# Patient Record
Sex: Female | Born: 1935 | Race: White | Hispanic: No | Smoking: Never smoker
Health system: Southern US, Community
[De-identification: ages and names within clinical notes are randomized; demographics above are authoritative.]

## PROBLEM LIST (undated history)

## (undated) DIAGNOSIS — E079 Disorder of thyroid, unspecified: Secondary | ICD-10-CM

## (undated) DIAGNOSIS — I639 Cerebral infarction, unspecified: Secondary | ICD-10-CM

## (undated) DIAGNOSIS — N3281 Overactive bladder: Secondary | ICD-10-CM

## (undated) DIAGNOSIS — N189 Chronic kidney disease, unspecified: Secondary | ICD-10-CM

## (undated) DIAGNOSIS — I10 Essential (primary) hypertension: Secondary | ICD-10-CM

## (undated) DIAGNOSIS — M1990 Unspecified osteoarthritis, unspecified site: Secondary | ICD-10-CM

## (undated) DIAGNOSIS — K219 Gastro-esophageal reflux disease without esophagitis: Secondary | ICD-10-CM

## (undated) DIAGNOSIS — I82409 Acute embolism and thrombosis of unspecified deep veins of unspecified lower extremity: Secondary | ICD-10-CM

## (undated) HISTORY — PX: ABDOMINAL HYSTERECTOMY: SHX81

## (undated) HISTORY — PX: TOTAL HIP ARTHROPLASTY: SHX124

## (undated) HISTORY — DX: Acute embolism and thrombosis of unspecified deep veins of unspecified lower extremity: I82.409

## (undated) HISTORY — DX: Unspecified osteoarthritis, unspecified site: M19.90

## (undated) HISTORY — PX: OTHER SURGICAL HISTORY: SHX169

## (undated) HISTORY — DX: Overactive bladder: N32.81

## (undated) HISTORY — DX: Disorder of thyroid, unspecified: E07.9

## (undated) HISTORY — DX: Essential (primary) hypertension: I10

## (undated) HISTORY — DX: Cerebral infarction, unspecified: I63.9

## (undated) HISTORY — PX: TOTAL KNEE ARTHROPLASTY: SHX125

## (undated) HISTORY — DX: Gastro-esophageal reflux disease without esophagitis: K21.9

---

## 1998-07-21 ENCOUNTER — Other Ambulatory Visit: Admission: RE | Admit: 1998-07-21 | Discharge: 1998-07-21 | Payer: Self-pay | Admitting: Family Medicine

## 2000-05-14 ENCOUNTER — Ambulatory Visit (HOSPITAL_COMMUNITY): Admission: RE | Admit: 2000-05-14 | Discharge: 2000-05-14 | Payer: Self-pay | Admitting: Ophthalmology

## 2001-09-02 ENCOUNTER — Encounter: Payer: Self-pay | Admitting: Orthopedic Surgery

## 2001-09-02 ENCOUNTER — Ambulatory Visit (HOSPITAL_COMMUNITY): Admission: RE | Admit: 2001-09-02 | Discharge: 2001-09-02 | Payer: Self-pay | Admitting: Orthopedic Surgery

## 2001-09-16 ENCOUNTER — Encounter: Payer: Self-pay | Admitting: Neurosurgery

## 2001-09-18 ENCOUNTER — Inpatient Hospital Stay (HOSPITAL_COMMUNITY): Admission: RE | Admit: 2001-09-18 | Discharge: 2001-09-22 | Payer: Self-pay | Admitting: Neurosurgery

## 2001-09-18 ENCOUNTER — Encounter: Payer: Self-pay | Admitting: Neurosurgery

## 2002-11-02 ENCOUNTER — Encounter: Payer: Self-pay | Admitting: Orthopedic Surgery

## 2002-11-02 ENCOUNTER — Inpatient Hospital Stay (HOSPITAL_COMMUNITY): Admission: AD | Admit: 2002-11-02 | Discharge: 2002-11-08 | Payer: Self-pay | Admitting: Orthopedic Surgery

## 2002-11-08 ENCOUNTER — Inpatient Hospital Stay
Admission: RE | Admit: 2002-11-08 | Discharge: 2002-11-18 | Payer: Self-pay | Admitting: Physical Medicine & Rehabilitation

## 2002-11-10 ENCOUNTER — Encounter: Payer: Self-pay | Admitting: Orthopedic Surgery

## 2003-01-13 ENCOUNTER — Encounter: Admission: RE | Admit: 2003-01-13 | Discharge: 2003-04-12 | Payer: Self-pay | Admitting: Orthopedic Surgery

## 2003-06-27 ENCOUNTER — Encounter: Admission: RE | Admit: 2003-06-27 | Discharge: 2003-06-27 | Payer: Self-pay | Admitting: Emergency Medicine

## 2003-06-27 ENCOUNTER — Encounter: Payer: Self-pay | Admitting: Orthopedic Surgery

## 2003-07-11 ENCOUNTER — Ambulatory Visit (HOSPITAL_BASED_OUTPATIENT_CLINIC_OR_DEPARTMENT_OTHER): Admission: RE | Admit: 2003-07-11 | Discharge: 2003-07-12 | Payer: Self-pay | Admitting: Orthopedic Surgery

## 2004-08-14 ENCOUNTER — Ambulatory Visit: Payer: Self-pay | Admitting: Physical Medicine & Rehabilitation

## 2004-08-14 ENCOUNTER — Inpatient Hospital Stay (HOSPITAL_COMMUNITY): Admission: RE | Admit: 2004-08-14 | Discharge: 2004-08-17 | Payer: Self-pay | Admitting: Orthopedic Surgery

## 2004-08-17 ENCOUNTER — Inpatient Hospital Stay
Admission: RE | Admit: 2004-08-17 | Discharge: 2004-08-24 | Payer: Self-pay | Admitting: Physical Medicine & Rehabilitation

## 2004-10-01 ENCOUNTER — Encounter: Admission: RE | Admit: 2004-10-01 | Discharge: 2004-11-12 | Payer: Self-pay | Admitting: Orthopedic Surgery

## 2004-11-08 ENCOUNTER — Ambulatory Visit: Payer: Self-pay | Admitting: Family Medicine

## 2005-02-06 ENCOUNTER — Ambulatory Visit: Payer: Self-pay | Admitting: Family Medicine

## 2005-05-02 ENCOUNTER — Ambulatory Visit: Payer: Self-pay | Admitting: Family Medicine

## 2005-05-30 ENCOUNTER — Ambulatory Visit: Payer: Self-pay | Admitting: Family Medicine

## 2005-07-29 ENCOUNTER — Ambulatory Visit: Payer: Self-pay | Admitting: Family Medicine

## 2005-10-08 ENCOUNTER — Ambulatory Visit: Payer: Self-pay | Admitting: Family Medicine

## 2006-02-11 ENCOUNTER — Encounter: Admission: RE | Admit: 2006-02-11 | Discharge: 2006-02-11 | Payer: Self-pay | Admitting: Orthopedic Surgery

## 2006-05-15 ENCOUNTER — Ambulatory Visit: Payer: Self-pay | Admitting: Family Medicine

## 2006-08-29 ENCOUNTER — Ambulatory Visit: Payer: Self-pay | Admitting: Family Medicine

## 2006-09-29 ENCOUNTER — Ambulatory Visit: Payer: Self-pay | Admitting: Family Medicine

## 2006-11-20 ENCOUNTER — Ambulatory Visit: Payer: Self-pay | Admitting: Family Medicine

## 2007-01-12 ENCOUNTER — Ambulatory Visit: Payer: Self-pay | Admitting: Family Medicine

## 2007-01-29 ENCOUNTER — Ambulatory Visit: Payer: Self-pay | Admitting: Family Medicine

## 2007-01-29 ENCOUNTER — Inpatient Hospital Stay (HOSPITAL_COMMUNITY): Admission: AD | Admit: 2007-01-29 | Discharge: 2007-02-11 | Payer: Self-pay | Admitting: Internal Medicine

## 2007-02-05 ENCOUNTER — Ambulatory Visit: Payer: Self-pay | Admitting: Internal Medicine

## 2007-03-16 ENCOUNTER — Ambulatory Visit: Payer: Self-pay | Admitting: Family Medicine

## 2007-03-19 ENCOUNTER — Ambulatory Visit: Payer: Self-pay | Admitting: Family Medicine

## 2007-05-09 ENCOUNTER — Ambulatory Visit: Payer: Self-pay | Admitting: Cardiology

## 2007-05-09 ENCOUNTER — Inpatient Hospital Stay (HOSPITAL_COMMUNITY): Admission: EM | Admit: 2007-05-09 | Discharge: 2007-05-13 | Payer: Self-pay | Admitting: *Deleted

## 2007-05-12 ENCOUNTER — Encounter: Payer: Self-pay | Admitting: Cardiology

## 2007-07-02 ENCOUNTER — Inpatient Hospital Stay (HOSPITAL_COMMUNITY): Admission: EM | Admit: 2007-07-02 | Discharge: 2007-07-06 | Payer: Self-pay | Admitting: Emergency Medicine

## 2007-07-02 ENCOUNTER — Ambulatory Visit: Payer: Self-pay | Admitting: Cardiovascular Disease

## 2008-02-25 ENCOUNTER — Ambulatory Visit (HOSPITAL_COMMUNITY): Admission: RE | Admit: 2008-02-25 | Discharge: 2008-02-25 | Payer: Self-pay | Admitting: Orthopedic Surgery

## 2008-03-10 ENCOUNTER — Inpatient Hospital Stay (HOSPITAL_COMMUNITY): Admission: RE | Admit: 2008-03-10 | Discharge: 2008-03-13 | Payer: Self-pay | Admitting: Orthopedic Surgery

## 2008-03-28 ENCOUNTER — Inpatient Hospital Stay (HOSPITAL_COMMUNITY): Admission: AD | Admit: 2008-03-28 | Discharge: 2008-04-04 | Payer: Self-pay | Admitting: Orthopedic Surgery

## 2008-03-28 ENCOUNTER — Ambulatory Visit: Payer: Self-pay | Admitting: Cardiology

## 2008-03-29 ENCOUNTER — Ambulatory Visit: Payer: Self-pay | Admitting: Infectious Diseases

## 2008-04-20 ENCOUNTER — Ambulatory Visit (HOSPITAL_COMMUNITY): Admission: RE | Admit: 2008-04-20 | Discharge: 2008-04-20 | Payer: Self-pay | Admitting: Family Medicine

## 2008-07-11 IMAGING — CR DG CHEST 2V
1 series · 1 of 1 positions shown · non-contrast
Comparison: 02/25/2008

CLINICAL DATA: Hip infection.  Preoperative respiratory exam.
Hypertension.

CHEST - 2 VIEW

[view not recorded]
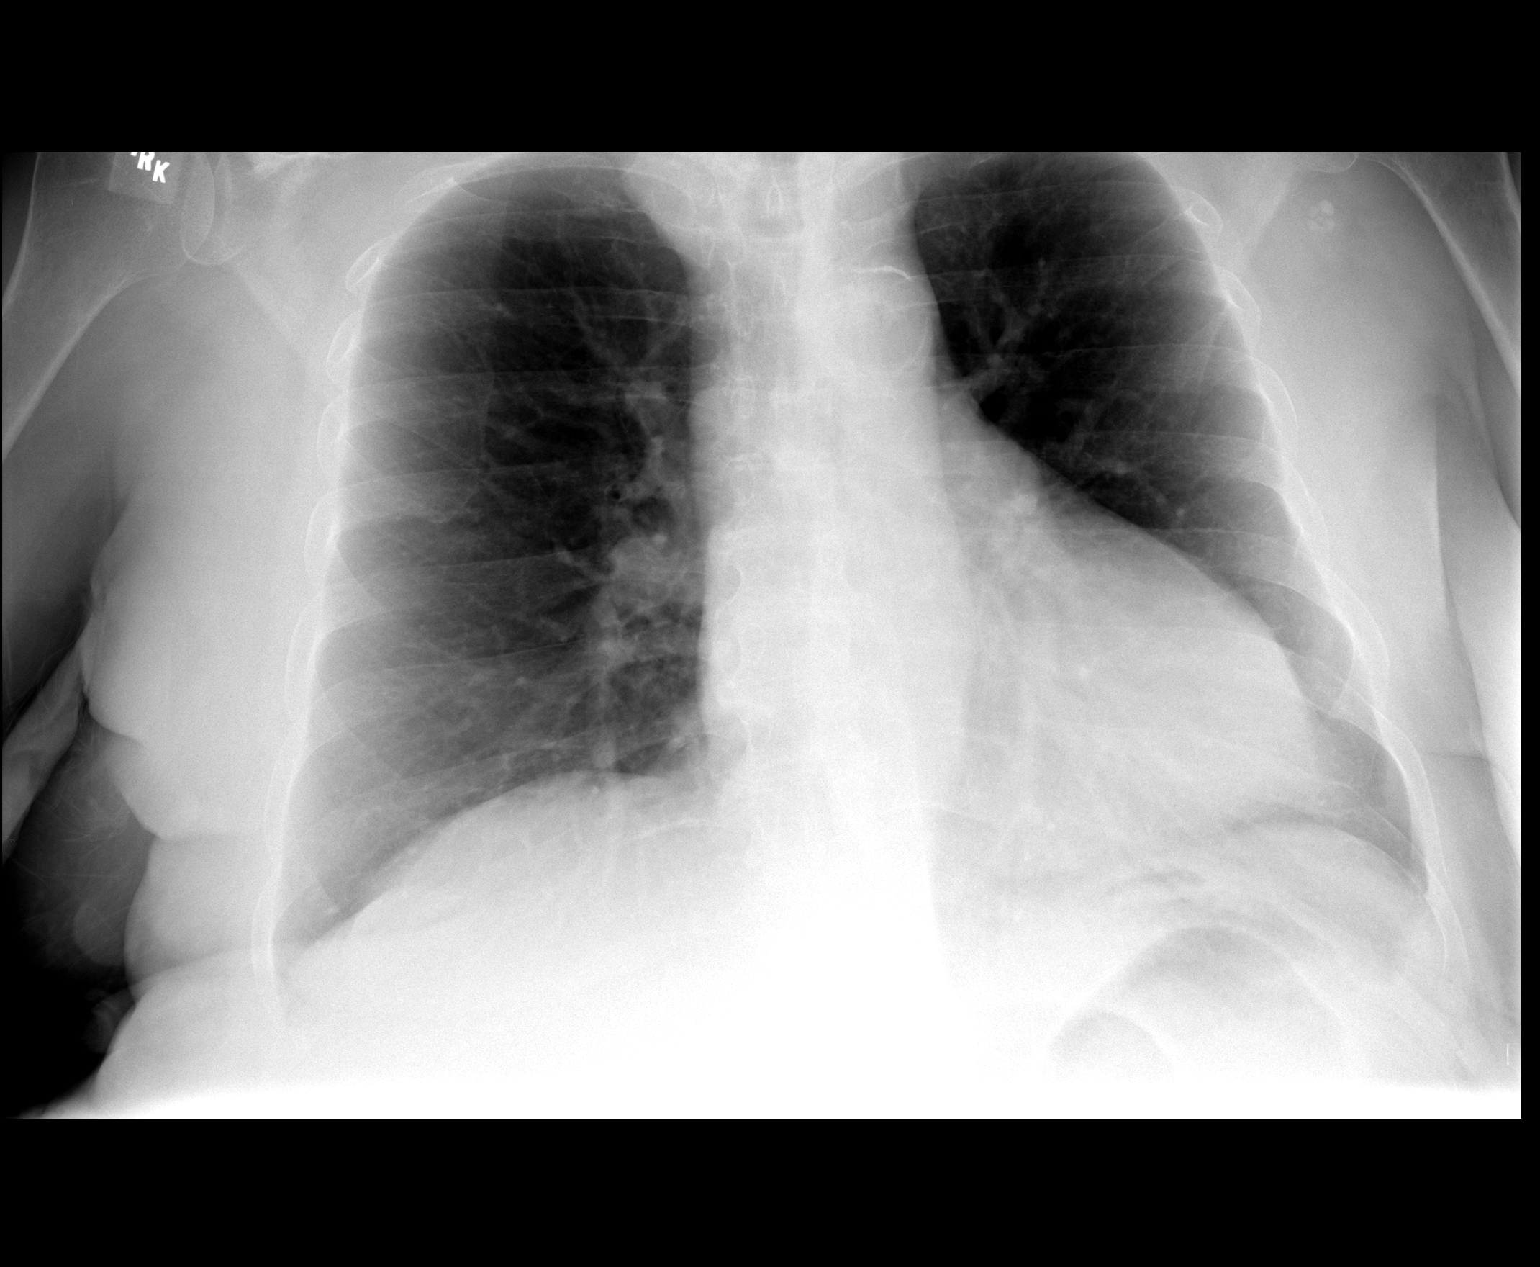

[1 of 1 positions shown; findings below may reference images not displayed]

FINDINGS: The heart is at the upper limits normal in size.  There
is calcification of the thoracic aorta.  Lungs are clear.  No
effusions.  No significant bony finding.
IMPRESSION: No active disease

## 2008-07-14 IMAGING — CR DG CHEST 1V PORT
1 series · 1 of 1 positions shown · non-contrast
Comparison: 03/28/2008.

CLINICAL DATA: Infected total hip replacement.  PICC line
placement.

PORTABLE CHEST - 1 VIEW

[view not recorded]
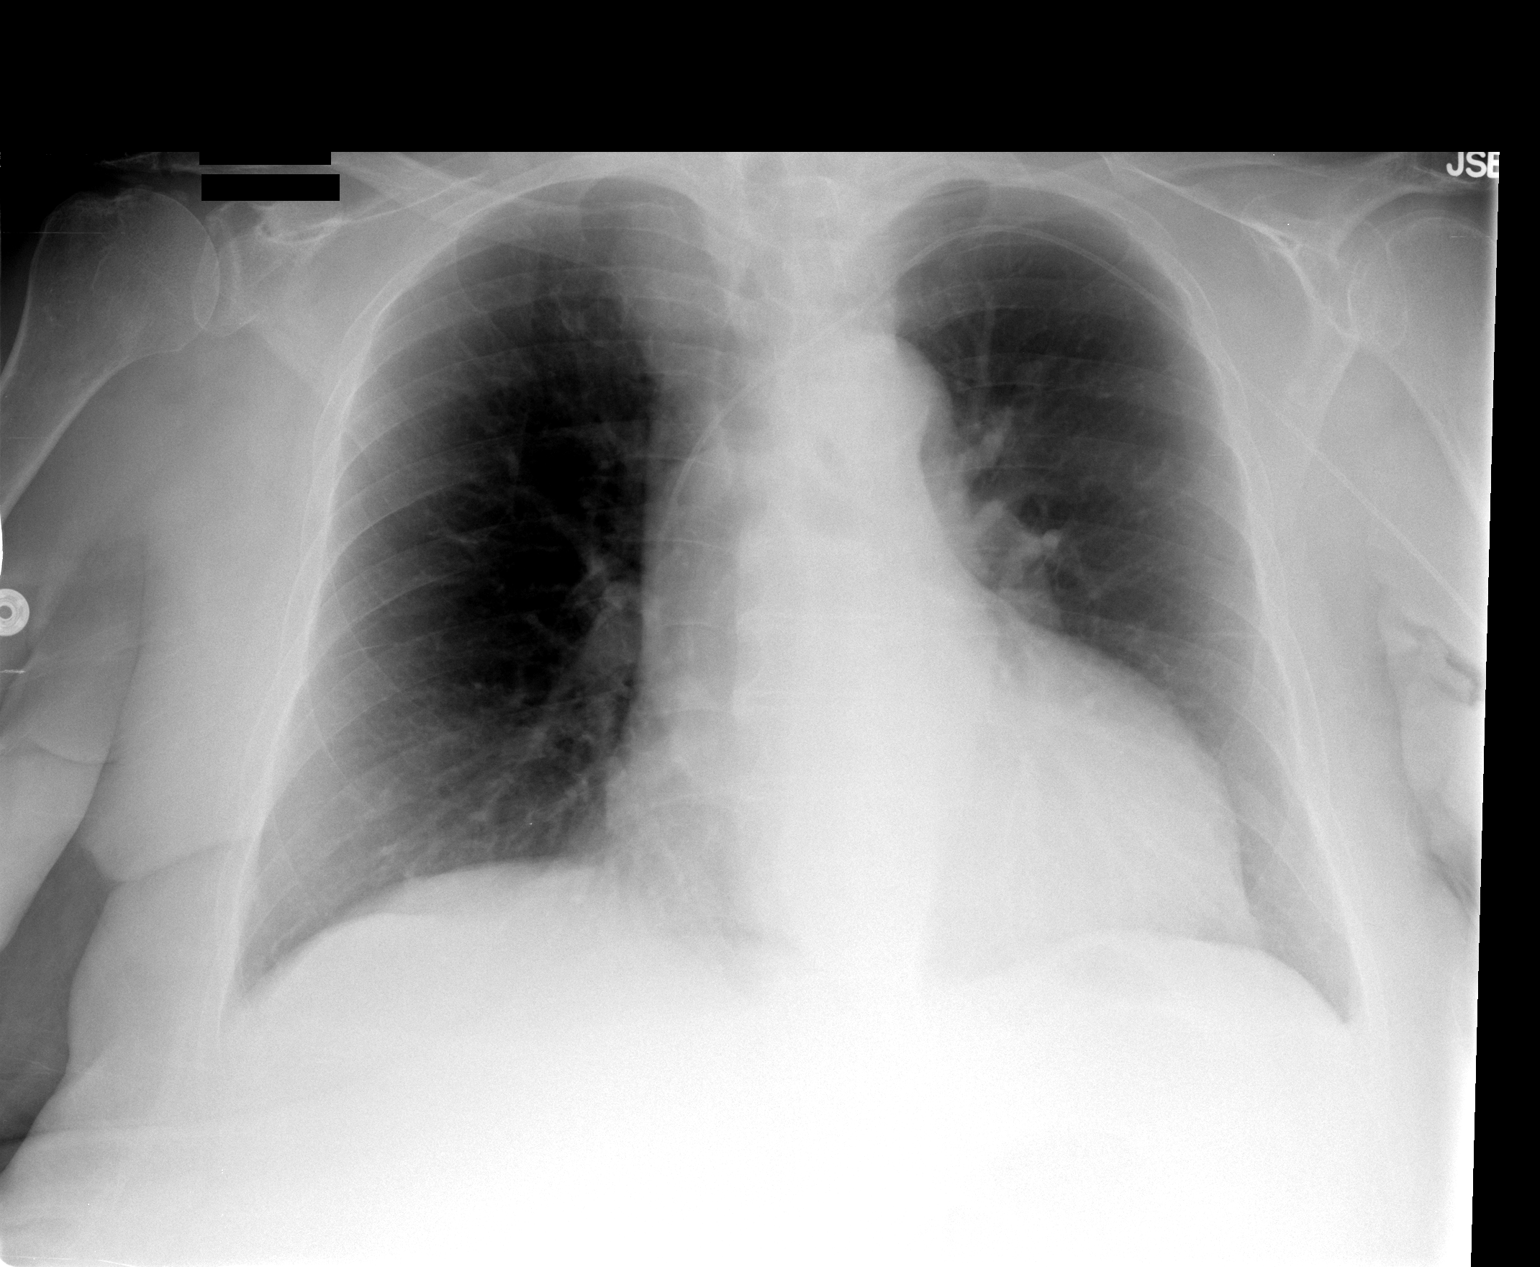

[1 of 1 positions shown; findings below may reference images not displayed]

FINDINGS: 8688 hours.  Left arm PICC tip is in the lower SVC.
There is no pneumothorax. The lungs are clear.  Cardiomegaly and
mediastinal contours are unchanged.
IMPRESSION: PICC line tip in the lower SVC.  No pneumothorax or acute findings.

## 2008-07-15 IMAGING — CR DG CHEST 1V PORT
1 series · 1 of 1 positions shown · non-contrast
Comparison: 03/31/2008

CLINICAL DATA: Infected right total hip.  Chest pain.

PORTABLE CHEST - 1 VIEW

[view not recorded]
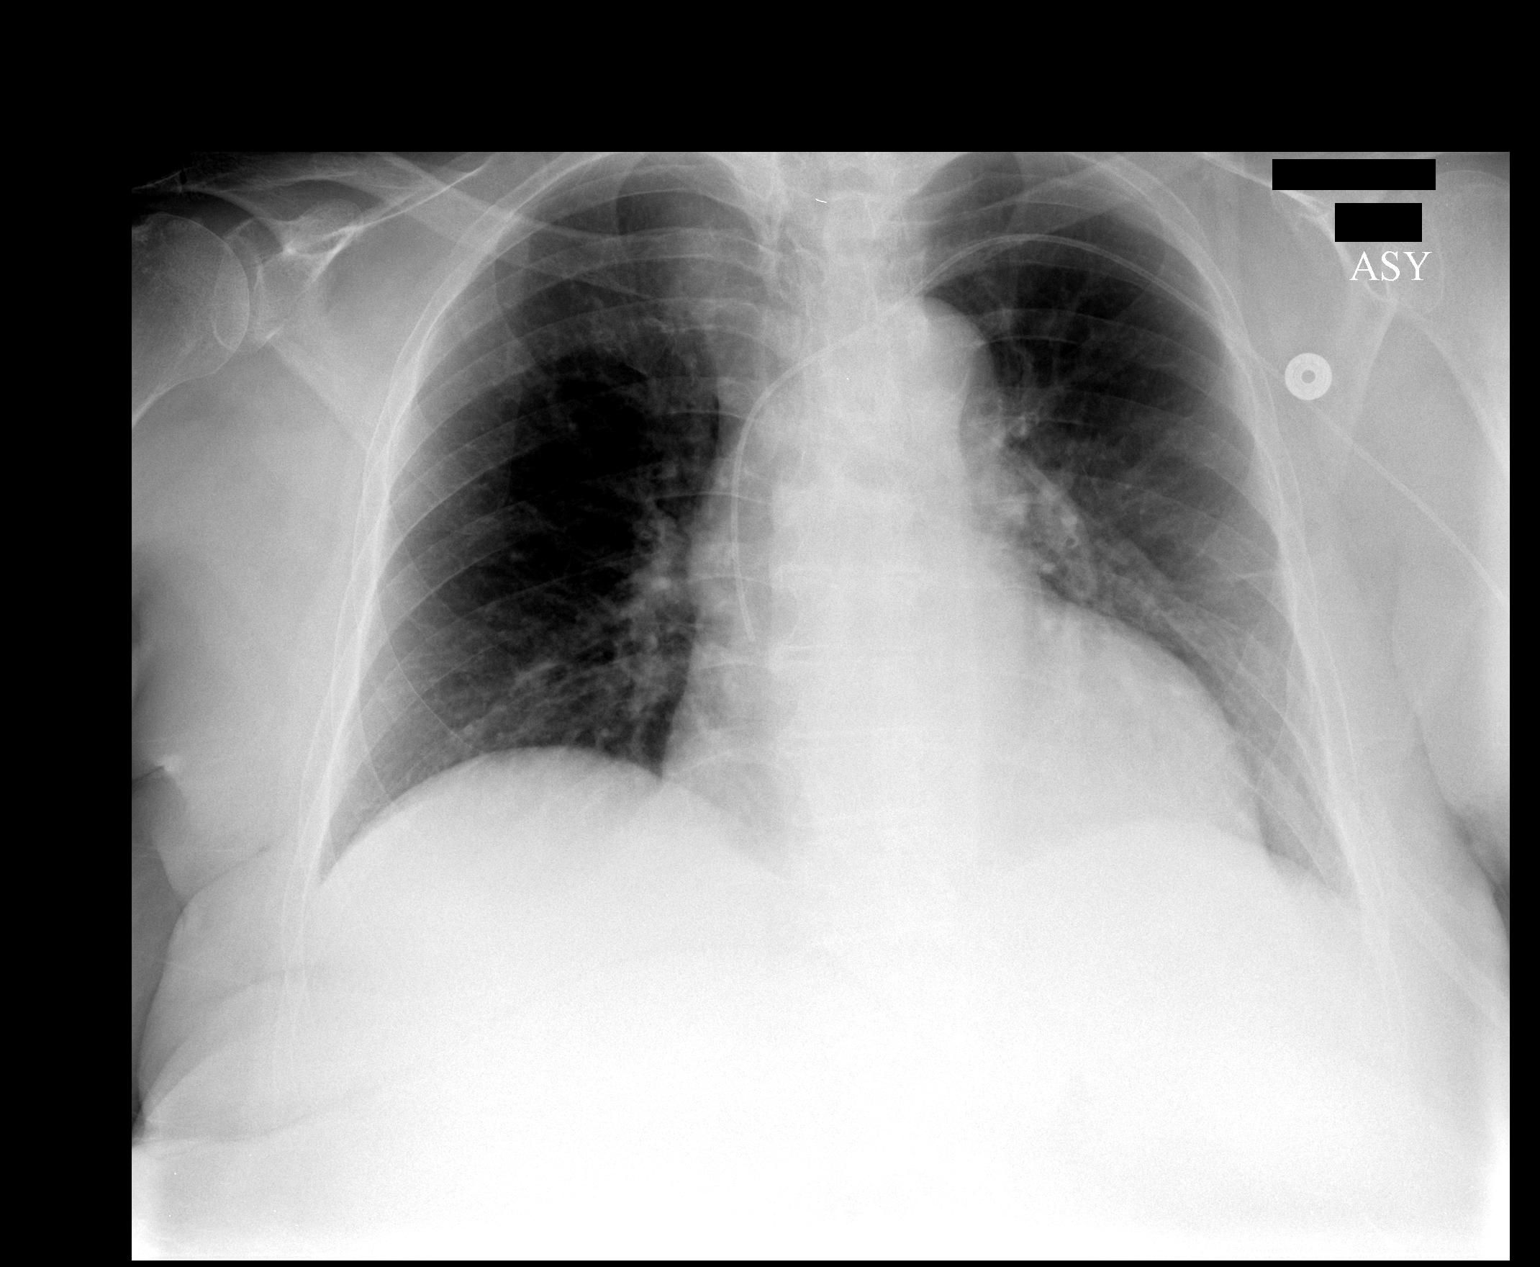

[1 of 1 positions shown; findings below may reference images not displayed]

FINDINGS: Left upper extremity PICC line tip is near the SVC -
right atrial junction.  Linear scarring left mid lung zone.  No
infiltrate or edema.  No CHF.  Cardiomediastinal silhouette appears
unremarkable.
IMPRESSION: No acute chest findings.

## 2008-07-15 IMAGING — CR DG ABD PORTABLE 1V
1 series · 1 of 1 positions shown · non-contrast
Comparison: 01/31/2007

CLINICAL DATA: Infected right hip.  Evaluate for obstruction.

ABDOMEN - 1 VIEW

[view not recorded]
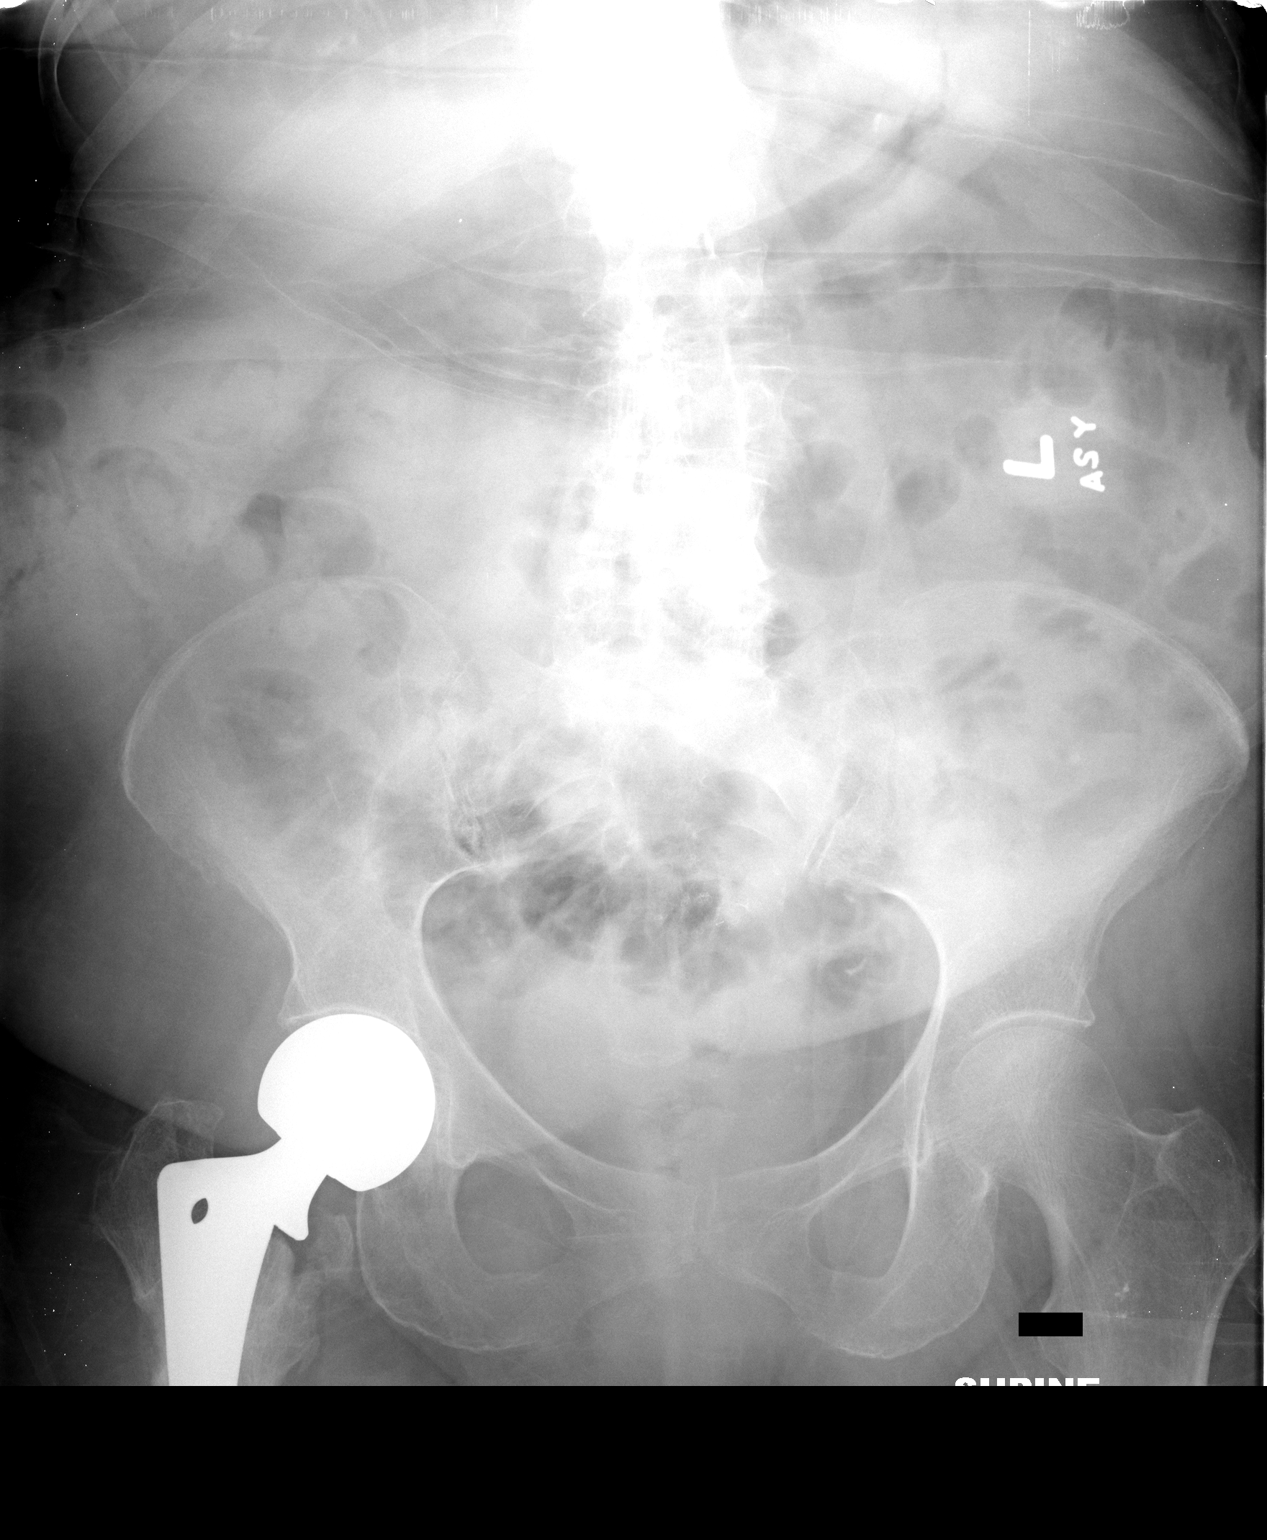

[1 of 1 positions shown; findings below may reference images not displayed]

FINDINGS: Portable supine view of the abdomen was obtained.  The
patient has an IVC filter present.  The patient has a right hip
hemiarthroplasty.  The bowel gas pattern is nonspecific with
suggestion of gas in the small and large bowel.  The patient has
stable levoscoliosis of the lumbar spine.
IMPRESSION: Nonspecific bowel gas pattern.  Right hip hemiarthroplasty and
presence of an IVC filter.

## 2008-10-05 ENCOUNTER — Encounter: Admission: RE | Admit: 2008-10-05 | Discharge: 2008-10-05 | Payer: Self-pay | Admitting: *Deleted

## 2008-11-30 ENCOUNTER — Ambulatory Visit (HOSPITAL_COMMUNITY): Admission: RE | Admit: 2008-11-30 | Discharge: 2008-12-05 | Payer: Self-pay | Admitting: *Deleted

## 2009-02-06 ENCOUNTER — Inpatient Hospital Stay (HOSPITAL_COMMUNITY): Admission: RE | Admit: 2009-02-06 | Discharge: 2009-02-10 | Payer: Self-pay | Admitting: *Deleted

## 2009-02-06 ENCOUNTER — Encounter (INDEPENDENT_AMBULATORY_CARE_PROVIDER_SITE_OTHER): Payer: Self-pay | Admitting: Surgery

## 2010-10-24 ENCOUNTER — Inpatient Hospital Stay (HOSPITAL_COMMUNITY)
Admission: RE | Admit: 2010-10-24 | Discharge: 2010-10-28 | Payer: Self-pay | Source: Home / Self Care | Attending: Surgery | Admitting: Surgery

## 2010-12-04 NOTE — Discharge Summary (Signed)
  NAMECHERYLN, Tracy Walker NO.:  [id number]  MEDICAL RECORD NO.:  [id number]          PATIENT TYPE:  INP  LOCATION:  1528                         FACILITY:  [facility]  PHYSICIAN:  Ardeth Sportsman, MD     DATE OF BIRTH:  Mar 05, 1928  DATE OF ADMISSION:  10/24/2010 DATE OF DISCHARGE:  10/28/2010                              DISCHARGE SUMMARY   PRIMARY CARE PHYSICIAN:  Dr. Lia Hopping  SURGEON:  Ardeth Sportsman, M.D.  DIAGNOSIS:  Ventral incisional hernias x4, 20 x 12 cm total region.  PROCEDURE PERFORMED:  Laparoscopic lysis of adhesions, laparoscopic ventral hernia repair with mesh, and open primary repair and excision of excess hernia sac, all on 10/24/2010.  OTHER DIAGNOSES: 1. Anemia. 2. Hypertension. 3. Gastroesophageal reflux disease. 4. Urinary incontinence. 5. Status post appendectomy. 6. Diverticulosis with episode of perforated diverticulitis, status     post Hartmann procedure in 2008, and colostomy takedown and primary     hernia repair in 2010.  DISCHARGE MEDICATIONS:  Discharge medications are noted on the chart and include, 1. Multivitamin daily. 2. Coenzyme Q daily. 3. Enteric-coated aspirin 81 mg daily. 4. Detrol 4 mg p.o. q.h.s. 5. Hydrocodone/acetaminophen 5/500 mg 1-2 p.o. t.i.d. p.r.n. pain. 6. Omeprazole 20 mg p.o. q.a.m. 7. Iron sulfate 325 mg daily. 8. Clonidine 0.2 mg b.i.d. 9. Furosemide 20 mg b.i.d.  SUMMARY OF HOSPITAL COURSE:  Tracy Walker is an 77 year old obese female who had required emergent surgery for perforated diverticulitis per Dr. Cleotis Nipper in 2008.  She underwent colostomy takedown.  She developed hernias in numerous locations at these incisions.  She came to me for evaluation and surgical repair.  She underwent that primarily and mesh reinforcement laparoscopically.  Postoperatively, she was followed in-house and gradually started on liquids.  She did have some nausea and emesis in the first 24 hours, but that seemed  to improve.  She began to mobilize better.  She had physical therapy evaluation and thought she would benefit from home physical therapy.  By postoperative day #4, she was walking better with assistance.  She was eating better.  She was having flatus.  Drainage output around her former hernia sac had decreased.  Therefore, I thought it would be reasonable to discharge her home with following instructions, 1. She is to return to clinic to see me within 7-10 days for probable     drain removal. 2. She should work on physical therapy in home to exercise regularly. 3. She should stay on fiber and bowel regimen to avoid severe     constipation. 4. She should call if she has worsening fever, chills, sweats, nausea,     vomiting, uncontrolled pain, diarrhea, or other concerns.     Ardeth Sportsman, MD     SCG/MEDQ  D:  11/26/2010  T:  11/27/2010  Job:  [id number]  Electronically Signed by Karie Soda MD on 12/04/2010 09:50:03 AM

## 2011-01-07 LAB — CREATININE, SERUM
Creatinine, Ser: 1.34 mg/dL — ABNORMAL HIGH (ref 0.4–1.2)
GFR calc non Af Amer: 31 mL/min — ABNORMAL LOW (ref >60)
GFR calc non Af Amer: 38 mL/min — ABNORMAL LOW (ref >60)

## 2011-01-07 LAB — BASIC METABOLIC PANEL
BUN: 23 mg/dL (ref 6–23)
BUN: 24 mg/dL — ABNORMAL HIGH (ref 6–23)
BUN: 25 mg/dL — ABNORMAL HIGH (ref 6–23)
CO2: 27 mEq/L (ref 19–32)
Chloride: 104 mEq/L (ref 96–112)
Chloride: 107 mEq/L (ref 96–112)
Creatinine, Ser: 1.5 mg/dL — ABNORMAL HIGH (ref 0.4–1.2)
Creatinine, Ser: 1.69 mg/dL — ABNORMAL HIGH (ref 0.4–1.2)
Creatinine, Ser: 1.76 mg/dL — ABNORMAL HIGH (ref 0.4–1.2)
GFR calc non Af Amer: 29 mL/min — ABNORMAL LOW (ref >60)
Glucose, Bld: 135 mg/dL — ABNORMAL HIGH (ref 70–99)

## 2011-01-07 LAB — CBC
MCH: 28.9 pg (ref 26.0–34.0)
MCH: 29 pg (ref 26.0–34.0)
MCHC: 32 g/dL (ref 30.0–36.0)
MCHC: 32.3 g/dL (ref 30.0–36.0)
MCV: 90.1 fL (ref 78.0–100.0)
MCV: 90.4 fL (ref 78.0–100.0)
Platelets: 196 10*3/uL (ref 150–400)
Platelets: 273 10*3/uL (ref 150–400)
Platelets: 282 10*3/uL (ref 150–400)
RDW: 15.6 % — ABNORMAL HIGH (ref 11.5–15.5)
RDW: 15.7 % — ABNORMAL HIGH (ref 11.5–15.5)
RDW: 15.9 % — ABNORMAL HIGH (ref 11.5–15.5)
WBC: 7 10*3/uL (ref 4.0–10.5)
WBC: 8.1 10*3/uL (ref 4.0–10.5)

## 2011-01-07 LAB — SURGICAL PCR SCREEN
MRSA, PCR: NEGATIVE
Staphylococcus aureus: POSITIVE — AB

## 2011-02-06 LAB — BASIC METABOLIC PANEL
CO2: 31 mEq/L (ref 19–32)
Calcium: 9.3 mg/dL (ref 8.4–10.5)
Chloride: 106 mEq/L (ref 96–112)
Creatinine, Ser: 1.46 mg/dL — ABNORMAL HIGH (ref 0.4–1.2)
Glucose, Bld: 98 mg/dL (ref 70–99)
Sodium: 143 mEq/L (ref 135–145)

## 2011-02-06 LAB — CBC
Hemoglobin: 13.1 g/dL (ref 12.0–15.0)
Hemoglobin: 13.1 g/dL (ref 12.0–15.0)
MCHC: 33.9 g/dL (ref 30.0–36.0)
MCHC: 35.2 g/dL (ref 30.0–36.0)
MCV: 90.1 fL (ref 78.0–100.0)
Platelets: 215 10*3/uL (ref 150–400)
RDW: 13.8 % (ref 11.5–15.5)
RDW: 14.6 % (ref 11.5–15.5)

## 2011-02-06 LAB — CREATININE, SERUM
Creatinine, Ser: 1.38 mg/dL — ABNORMAL HIGH (ref 0.4–1.2)
GFR calc Af Amer: 45 mL/min — ABNORMAL LOW (ref >60)
GFR calc non Af Amer: 37 mL/min — ABNORMAL LOW (ref >60)

## 2011-02-06 LAB — MAGNESIUM: Magnesium: 2.1 mg/dL (ref 1.5–2.5)

## 2011-02-06 LAB — DIFFERENTIAL
Basophils Absolute: 0 10*3/uL (ref 0.0–0.1)
Basophils Relative: 1 % (ref 0–1)
Eosinophils Absolute: 0.1 10*3/uL (ref 0.0–0.7)
Monocytes Absolute: 0.5 10*3/uL (ref 0.1–1.0)
Monocytes Relative: 10 % (ref 3–12)
Neutro Abs: 3.6 10*3/uL (ref 1.7–7.7)

## 2011-02-06 LAB — PHOSPHORUS: Phosphorus: 1.7 mg/dL — ABNORMAL LOW (ref 2.3–4.6)

## 2011-02-06 LAB — POTASSIUM: Potassium: 3.7 mEq/L (ref 3.5–5.1)

## 2011-02-12 LAB — BASIC METABOLIC PANEL
BUN: 27 mg/dL — ABNORMAL HIGH (ref 6–23)
CO2: 26 mEq/L (ref 19–32)
Calcium: 9.5 mg/dL (ref 8.4–10.5)
Chloride: 106 mEq/L (ref 96–112)
Creatinine, Ser: 1.79 mg/dL — ABNORMAL HIGH (ref 0.4–1.2)

## 2011-02-12 LAB — CBC
MCHC: 33.6 g/dL (ref 30.0–36.0)
MCV: 90.5 fL (ref 78.0–100.0)
Platelets: 261 10*3/uL (ref 150–400)

## 2011-02-12 LAB — DIFFERENTIAL
Basophils Absolute: 0 10*3/uL (ref 0.0–0.1)
Basophils Relative: 0 % (ref 0–1)
Eosinophils Absolute: 0 10*3/uL (ref 0.0–0.7)
Neutrophils Relative %: 74 % (ref 43–77)

## 2011-03-12 NOTE — Op Note (Signed)
NAME:  Tracy Walker, Tracy Walker NO.:  [id number]   MEDICAL RECORD NO.:  [id number]          PATIENT TYPE:  INP   LOCATION:  5028                         FACILITY:  [facility]   PHYSICIAN:  Lenard Galloway. Mortenson, M.D.DATE OF BIRTH:  1928-07-30   DATE OF PROCEDURE:  03/29/2008  DATE OF DISCHARGE:                               OPERATIVE REPORT   PREOPERATIVE DIAGNOSIS:  Infected right hip wound.   POSTOPERATIVE DIAGNOSIS:  Infected right hip wound, size 30 cm x 8 cm x  8 cm.   OPERATIONS:  Incision and drainage, debridement and removal of all  sutures, 6000 mL of saline irrigation, and application of wound vac.   ANESTHESIA:  General.   SURGEON:  Rodney A. Chaney Malling, M.D.   ASSISTANT:  Arlys John D. Petrarca, PA-C.   PROCEDURE:  The patient was placed on the operating table in supine  position.  After satisfactory of general anesthesia, the patient placed  the right hip up.  Right hip and thigh was prepped with DuraPrep and  draped out in the usual manner.  Staples were removed from the wound.  The drainage in the upper part of wound was erythematous.  The staples  were removed and drainage came out of the upper part of the wound.  The  wound was then opened from one end to another, and there is a large  amount of purulent material proximally.  Both aerobic and anaerobic  cultures were sent off.  Dissection was carried distally.  There was  necrotic fat in the wound.  The abscess extended down to the fascia,  difficult to tell if this has penetrated through the fascia.  The  pulsating lavage was used and 6000 mL of fluid was used to irrigate the  wound.  All necrotic-appearing tissue was then debrided with a rongeur  throughout the process.  A great deal of time was spent irrigating and  exploring the wound.  Periodically, the wound extend down to the hip  joint itself, although the hip joint could not be seen.  Once this was  irrigated and cleaned out to my satisfaction, a large  wound vac sponge  was inserted in the wound.  Again the wound was 30 cm x 8 cm x 8 cm.  The wound vac was then closed in a standard manner and suction was  applied in a standard manner.  The patient returned to the recovery room  in excellent condition.   DRAINS:  Wound vac.   COMPLICATIONS:  None.      Rodney A. Chaney Malling, M.D.  Electronically Signed     RAM/MEDQ  D:  03/29/2008  T:  03/29/2008  Job:  [id number]

## 2011-03-12 NOTE — Discharge Summary (Signed)
NAME:  Tracy Walker, Tracy Walker NO.:  [id number]   MEDICAL RECORD NO.:  [id number]          PATIENT TYPE:  INP   LOCATION:  2013                         FACILITY:  [facility]   PHYSICIAN:  Jesse Sans. Wall, MD, FACCDATE OF BIRTH:  15-May-1928   DATE OF ADMISSION:  05/09/2007  DATE OF DISCHARGE:  05/13/2007                               DISCHARGE SUMMARY   PRIMARY CARDIOLOGIST:  Juanito Doom, M.D.   PRIMARY CARE PHYSICIAN:  Delaney Meigs, M.D.   DISCHARGE DIAGNOSES:  1. Status post bilateral pulmonary emboli requiring anticoagulation      therapy with Coumadin/Lovenox bridging at time of discharge with an      INR 1.70.  2. Elevated TSH noted this admission at 6.031.   PAST MEDICAL HISTORY:  1. Acute renal failure.  2. Abdominal abscess.  3. Iron deficiency anemia.  4. Hypertension.  5. Deconditioning.  6. Status post recent abdominal surgery secondary to bowel abscess      requiring colostomy.  7. Cholecystectomy.  8. Hiatal hernia repair.  9. Hysterectomy.  10.Cataract surgery.  11.Upper reduction internal fixation left knee with subsequent left      knee arthroscopy.  12.Hardware removal by Dr. Sandria Bales.  13.Recent perforated diverticulitis with colostomy in the right upper      quadrant with open wound in the left lower quadrant which appears      to be stable at this time.   PROCEDURES:  This admission include:  1. VQ scan July 12 showing high probability for clinically significant      pulmonary embolus bilaterally.  2. Insertion of PICC line on May 11, 2007.   CONSULTATIONS:  This admission:  Consult with Dr. Ovidio Kin on July  12 secondary to abdominal incision.   HOSPITAL COURSE:  Tracy Walker is a 77 year old Caucasian female who was  recovering at a nursing home in [place] when she had sudden onset of chest  discomfort on day of admission.  Per report from staff, the patient  passed out.  When the patient responded, she complained of severe  epigastric and mid sternal chest discomfort.  EMS was notified.  Initially, the patient was classified as a code STEMI secondary to an  EKG abnormality.  The patient was seen by Dr. Excell Seltzer in the ER. Code  STEMI was cancelled.  The patient was diagnosed with right bundle branch  block which is a new finding for the patient.  The patient was given  morphine and oxygen.  Pain was somewhat relieved.  Tracy Walker had  recently been discharged from [facility] for a large abdominal  wound related to a subcutaneous perforation of her colon causing  abdominal wall infection.  The patient was recovered at a nursing home  and was stable from a wound perspective.  Here in the emergency room,  cardiac enzymes revealed elevated troponin at 0.16.  The patient was  seen and examined by Dr. Daleen Squibb, and code STEMI was cancelled.  The  patient was treated for dehydration and pain control and admitted for  close monitoring.  The patient was anticoagulated with heparin.  The  patient underwent VQ scan of the lungs on July 12 that showed high  possibility for clinically significant pulmonary embolism bilaterally.  The patient to follow up with primary care physician for further  evaluation regarding abnormal TSH.  Dr. Ezzard Standing asked to consult  regarding abdominal wound status post surgical treatment.  Dr. Ezzard Standing  felt incision was stable, recommended continuing dressing changes  b.i.d., follow up with primary surgeon at outpatient.  Pharmacy managed  Coumadin, heparin therapy.  Physical therapy also in to work with the  patient secondary to deconditioning.  The patient continued to improve,  tolerating anticoagulation therapy without problems.  On day of  discharge, patient was stable, arranging discharge back home to  [facility] in [place] with Lovenox overlapping therapy for 48 hours.  The patient's PT/INR to be managed by Dr. Shelby Dubin at Coumadin clinic  with [facility].   At time of discharge, H&H  10.6/31.6, platelet count 265,000.  PT 20.2,  INR 1.7.  Potassium 4.5, glucose 102, BUN and creatinine 8 and 1.25,  sodium 139.  TSH noted to be elevated to 6.031.  Magnesium 2.1.   Medications at time of discharge include:  1. Warfarin 5 mg p.o. daily or as instructed by Dr. Jimmey Ralph.  2. Norvasc 10 mg daily.  3. Protonix 40 mg daily.  4. Lovenox injections 70 mg every 12 hours; Lovenox will be initiated      prior to discharge.   I have asked [facility] to fax a daily PT/INR to Dr. Shelby Dubin at [phone] to adjust Coumadin dose and monitor Lovenox therapy.  Shelby Dubin  to determine when daily PT/INR can be discontinued and adjusted per  needed frequency.  The patient has a follow-up appointment with Dr. Daleen Squibb  at [facility] July 3 at 4:15.  The patient to follow up with Dr.  Lysbeth Galas and Dr. Elmyra Ricks as previously prescribed.   Duration of discharge encounter greater than 30 minutes.      Dorian Pod, ACNP      Jesse Sans. Daleen Squibb, MD, [facility]  Electronically Signed    MB/MEDQ  D:  05/13/2007  T:  05/13/2007  Job:  [id number]   cc:   Delaney Meigs, M.D.  Henry A. Cleotis Nipper, M.D.

## 2011-03-12 NOTE — Consult Note (Signed)
NAME:  Tracy, Walker NO.:  [id number]   MEDICAL RECORD NO.:  [id number]          PATIENT TYPE:  INP   LOCATION:  2013                         FACILITY:  [facility]   PHYSICIAN:  Sandria Bales. Ezzard Standing, M.D.  DATE OF BIRTH:  28-Oct-1928   DATE OF CONSULTATION:  05/09/2007  DATE OF DISCHARGE:                                 CONSULTATION   HISTORY OF PRESENT ILLNESS:  Ms. Tracy Walker is a 77 year old white female who  was a patient at [facility], where she was being managed  by Dr. Lia Walker.  She woke up this morning with a sudden onset of  chest pain and syncope and was brought by ambulance to the [facility], where she had a V/Q scan, admitted by  Dr. Jesse Sans. Walker with a diagnosis of a pulmonary embolism.   Of note, she had a perforated diverticular disease, managed by Dr. Kathaleen Walker. Tracy Walker in [facility]  in June, 2008.  According to the  family, the date that she had her initial surgery on April 07, 2007, and  she needed a colostomy.  It is unclear to me at this time whether it is  a loop colostomy or an end colostomy. There are no operative notes in  her chart. She has a large left lower quadrant open wound.  She was  discharged from [facility] about April 21, 2007, to [facility].  She had a VAC on the wound at first.  This was then switched over to  dressing changes.  She otherwise has been doing well from this abdominal  surgery.   PAST MEDICAL HISTORY:  1. She was admitted to [facility] from January 29, 2007, to February 11, 2007, by Dr. Madaline Walker.  Her discharge      diagnoses at that time was acute renal failure, secondary to volume      depletion, vomiting and diarrhea, heme-positive stools,      hypertension and severe deconditioning.  At that time her primary      medical doctor was Dr. Delaney Walker.  I think because she is      now at [facility], the patient and the family  will now be followed      by Dr. Olena Walker, whom I think goes to [facility] from a medical      standpoint.   During her hospitalization in April, she also saw Tracy Walker,  who saw her with an EGD showing some inflation of the pylorus.  She had  a colonoscopy that showed diverticula throughout her colon with some  large hemorrhoids.  I think he suggested a capsule endoscopy as an  outpatient, though I am not sure that this was ever done.   She also had acute renal failure with a creatinine as high as 8 on  admission.  This steadily improved where it returned back to about the  1.6 range.  She was also severely deconditioned.   PHYSICAL EXAMINATION:  VITAL SIGNS:  Blood  pressure 116/66, temperature  98.1 degrees, pulse 100.  She is saturating at 99%.  GENERAL:  She is a well-nourished older white female, alert and pleasant  on physical examination.  HEENT:  Unremarkable.  LUNGS:  Clear to auscultation.  Actually she has pretty good  respirations by my account.  HEART:  A regular rate and rhythm.  ABDOMEN:  Shows a colostomy in the right upper quadrant, though a bowel-  fitting colostomy bag.  In her left lower quadrant she has a large open  wound, which is probably about 25 cm in length and about 4 or 5 cm in  width, but the wound is clean.  It appears to be appropriately cared  for.  Her abdomen is otherwise soft, nontender.  No guarding or rebound.   LABORATORY DATA:  Blood gas with a pH of 7.41, pCO2 of 36.  I do not see  an O2 with this gas.  Her white blood cell count is 16,100, hemoglobin  14, hematocrit 42, platelet count of 353,000.  PT 14.1, INR 1.1.  Sodium  134, potassium 5, glucose 138, creatinine 1.8, BUN 12.  Albumin low at  2.7, lipase 89.  Troponin mildly elevated at 0.19.   IMPRESSION:  1. Acute pulmonary embolism today, now being anticoagulated.  Dr.      Jesse Sans. Walker had asked me to see her and make sure that her      surgical history will be  addressed.  2. Recent perforated diverticulitis with colostomy in the right upper      quadrant.  3. Open wound in the left lower quadrant which appears to be      appropriately cared for and in good condition.  I tried to reassure      the family that I thought that she was doing very well from an      abdominal surgical standpoint.  4. History of renal failure in April 2008, due to dehydration.      Sandria Bales. Ezzard Standing, M.D.  Electronically Signed     DHN/MEDQ  D:  05/09/2007  T:  05/10/2007  Job:  [id number]   cc:   Alejandro Mulling A. Cleotis Nipper, M.D.  Thomas C. Wall, MD, [facility]

## 2011-03-12 NOTE — Discharge Summary (Signed)
NAME:  Tracy Walker, Tracy Walker NO.:  [id number]   MEDICAL RECORD NO.:  [id number]          PATIENT TYPE:  INP   LOCATION:  5002                         FACILITY:  [facility]   PHYSICIAN:  Lenard Galloway. Mortenson, M.D.DATE OF BIRTH:  [birth year]   DATE OF ADMISSION:  03/10/2008  DATE OF DISCHARGE:  03/13/2008                               DISCHARGE SUMMARY   ADMITTING DIAGNOSIS:  Painful right hip.   HISTORY OF PRESENT ILLNESS:  The patient is a 77 year old white female  with a history of a fall September 2008.  She underwent a compression  hip screw placement for this.  Hardware removal was done April 2009.  Approximately 11 days after hardware removal she began having right hip  pain.  X-rays reviewed a femoral neck fracture.  She was admitted to  [facility] for a right hip hemiarthroplasty.   PROCEDURES IN-HOUSE:  On Mar 10, 2008, the patient underwent right hip  hemiarthroplasty by Dr. Terrilee Croak.  She tolerated the procedure well.  She was started on Lovenox 40 mg subcu daily.  On the evening of May 14,  CPAP was placed.  She tolerated it well.  Postoperative day #1 she was  afebrile.  Her hemoglobin was stable at 10.0.  She had a slightly  elevated white count at 10.5.  She was metabolically stable with some  renal insufficiency with a creatinine of 1.23.  She was alert and  oriented x3.  Surgical wound was well approximated.  Her PCA was  discontinued, her O2 was discontinued, incentive spirometry was  encouraged.  Her FL2 was faxed out and [facility] offered her a bed.  She progressed well with physical therapy, ambulating 7 feet.  Surgical  wound as well approximated.  She did have some bacteria in her urine.  Due to her colostomy being so close to her surgical site as well as some  bacteria in her urine, she was started on Levaquin 500 mg the first day,  250 mg a day for 10 days, with the stop date being Mar 22, 2008.  Surgical wound is well approximated.  She has  a small amount of  serosanguineous drainage.  Postoperative day #2 her hemoglobin was down  to 8.3.  She had a temperature of 99.1.  She was given 2 units of packed  red blood cells due to her postoperative blood loss anemia.  Her  surgical dressing was changed.  Postoperative day #3 the patient's  hemoglobin was much improved with a hemoglobin of 11.0.  Her T-max in  the last 24 hours was 100.2.  We fell this is due to the bacteria in her  urine.  Her surgical wound is well approximated.  She does have a small  amount of serosanguineous drainage from it.  She is neurovascularly  intact.  She is being discharged to [facility] skilled nursing facility  50% weightbearing, on a regular diet.   DISCHARGE MEDICATIONS:  1. Lovenox 40 mg subcu daily until she is 1 month postoperative.  2. Catapres 0.2 mg one tablet once a day.  3. Iron 650 mg daily.  4. Hydrochlorothiazide  12.5 mg daily.  5. Multivitamin one tablet daily.  6. Calcium carbonate one tablet daily.  7. Labetalol 300 mg twice a day.  8. Levaquin 250 mg daily, stop date Mar 22, 2008.  9. Hydrocodone one to two q.4-6h. p.r.n. pain.   She will need daily dressing changes.  She will need physical therapy  daily.  She will follow up with Dr. Terrilee Croak at 2 weeks postoperative  for a wound check, staple removal and x-rays.      Kirstin Shepperson, P.A.      Rodney A. Chaney Malling, M.D.  Electronically Signed    KS/MEDQ  D:  03/13/2008  T:  03/13/2008  Job:  [id number]

## 2011-03-12 NOTE — H&P (Signed)
NAME:  Tracy Walker, PUERTA NO.:  [id number]   MEDICAL RECORD NO.:  [id number]          PATIENT TYPE:  INP   LOCATION:  0106                         FACILITY:  [facility]   PHYSICIAN:  Noralyn Pick. Eden Emms, MD, FACCDATE OF BIRTH:  December 09, 1927   DATE OF ADMISSION:  07/02/2007  DATE OF DISCHARGE:                              HISTORY & PHYSICAL   Tracy Walker is a 77 year old patient of Dr. Daleen Squibb.  The patient has fairly  complicated recent past medical history.  We were asked to evaluate her  for preop clearance prior to right hip surgery.  The patient had  significant problems with an abdominal wound infection and had been  recovering at a nursing home in [place] in the middle of July when she was  sent to [facility] as a Code STEMI.  She did not have a  myocardial infarction, she in actuality had bilateral pulmonary emboli;  this is likely secondary to her operation and prolonged bedrest.  The  patient recovered from her pulmonary emboli and was sent to [facility].  However, apparently Dr. Olena Leatherwood has been following her Coumadin levels.  They have been nontherapeutic.  As far as I can tell, they have not been  therapeutic in over 3 to 4 weeks.  Initially she said she had problems  with her Coumadin running too high and had some nosebleeds.   Today while out with her son she tripped and fell and broke her right  hip.  She needs right hip surgery.  The patient wants Dr. Chaney Malling to  do the surgery.  He fixed a previous problem with her left knee.   The patient does not have a history of coronary artery disease.  There  has been no history of arrhythmia, syncope or palpitations.  She has not  had a recent stress test.  A 2D echocardiogram that was done on July  15th showed normal LV function with no significant valvular heart  disease, right ventricular size and function were not that bad.   The patient had been doing well up until the time of her fall, her  abdomen had healed  well.  She has had a little bit of nausea from time  to time but her review of systems otherwise negative.   PAST MEDICAL HISTORY:  Is fairly extensive.  1. She had acute renal failure, abdominal abscess after colon surgery.  2. Iron deficiency anemia.  3. Hypertension.  4. Previous cholecystectomy.  5. Hiatal hernia repair.  6. Hysterectomy.  7. Cataract surgery.  8. Multiple surgeries to her left knee.  9. Perforated diverticulitis with colostomy which started her problem,      in regards to postop DVT and pulmonary emboli.  10.She had a VQ scan on July 12th showing high probability for      bilateral PE.  11.Interestingly, she also had a PICC line placed on July 14th, I      believe to the right upper extremity, which also increases her risk      for upper extremity DVT.   The patient denies any allergies.   FAMILY  HISTORY:  Noncontributory.   DISCHARGE MEDICATIONS SHE WAS SUPPOSED TO BE TAKING AT HOME:  1. Warfarin 2.5 mg Monday through Friday, 5 mg Saturday and Sunday.  2. Norvasc 10 mg a day.  3. Protonix 40 a day.   The patient initially had gone to [place].  She has been home for  about 6 weeks.  Her husband is with her.  She has family nearby.  She is  still relatively inactive.  She does not smoke or drink.   EXAM:  Remarkable for a pale, elderly white female in no distress.  Blood pressure is 130/70, she is in sinus rhythm at a rate of 80 with  occasional PACs and PVCs, respiratory rate is 14, weight was not  indicated, she is afebrile.  HEENT:  Normal.  Carotids normal without bruit.  There is no  lymphadenopathy, no thyromegaly, no JVP elevation.  LUNGS:  Currently clear with no wheezing, good diaphragmatic motion.  There is an S1 S2 with normal heart sounds.  PMI is not palpable.  ABDOMEN:  Protuberant.  There is still a small, non-healing area in the  umbilicus area.  No AAA, no tenderness, no hepatosplenomegaly, no  hepatojugular reflux.  Femorals are +2  bilaterally, PTs are +3  bilaterally.  There is no lower extremity edema, there no evidence of  residual DVT.  NEURO:  Nonfocal.  There is no muscular weakness.  She is status post  right hip fracture and I did not move the right leg.   Her EKG shows sinus rhythm with nonspecific ST-T wave changes and  occasional PVC.  There are no acute changes.  There is left axis  deviation.  Previously during her acute PE she had a right bundle branch  block which is resolved.   Her chest x-ray is remarkable for mild peribronchial thickening.  Heart  is upper limits of normal size with mild bronchitic changes.   LAB WORK:  Remarkable for potassium of 4.2, BUN 14, creatinine 1.2, INR  was only 1.8, hematocrit 35, platelets 279,000.   IMPRESSION:  1. In regards to preoperative clearance, I think the patient should      have a retrievable inferior vena cava filter prior to any surgery.      She has been subtherapeutic with her Coumadin.  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  The      best way to protect her from recurrent pulmonary embolism would be      to place a retrievable filter and then anticoagulate her      aggressively postoperatively.  I have talked to Dr. Chaney Malling about      this.  He will contact Dr. Lonia Skinner to see if this can be      arranged.  2. Hip surgery.  The hip surgery has been arranged for 9 a.m.      tomorrow.  The only way this can be done expeditiously is to have      the filter placed tonight.  Dr. Chaney Malling will talk to Dr. Deanne Coffer      about this.  3. Relatively high heart rate with premature atrial contractions and      premature ventricular contractions.  She does not have coronary      disease but I think it would be prudent to stop her Norvasc and  start low-dose beta-blocker at Lopressor 25 twice daily.  We will      be happy to monitor her  postoperatively.  She is at risk for      postoperative atrial fibrillation.  4. History of hypertension, currently stable.  Stop Norvasc, add beta-      blocker therapy.  5. History of pulmonary emboli.  Her pulmonary status appears stable.      There did not appear to be cor pulmonale by echocardiogram at the      time of her pulmonary embolism and I doubt that she has severe      residual pulmonary hypertension.  She is oxygenating fine.  She      will need good pulmonary toilet postoperatively but I do not think      that there will be significant complications from residual      pulmonary hypertension.  6. Diverticulitis with colostomy and recent abdominal surgery.  This      would also put her at high risk for ileus      postoperatively.  This will have to be followed closely and advance      her diet slowly.  She may benefit from perioperative Reglan in      regards to her bowels.  Again, I will leave this up to Dr.      Chaney Malling.   From a cardiac standpoint, she is cleared so long as she gets her IVC  filter.      Noralyn Pick. Eden Emms, MD, [facility]     PCN/MEDQ  D:  07/02/2007  T:  07/02/2007  Job:  [id number]

## 2011-03-12 NOTE — Discharge Summary (Signed)
NAME:  Tracy Walker, Tracy Walker NO.:  [id number]   MEDICAL RECORD NO.:  [id number]          PATIENT TYPE:  INP   LOCATION:  1619                         FACILITY:  [facility]   PHYSICIAN:  Lenard Galloway. Mortenson, M.D.DATE OF BIRTH:  06-30-1928   DATE OF ADMISSION:  07/02/2007  DATE OF DISCHARGE:  07/06/2007                               DISCHARGE SUMMARY   ADMISSION DIAGNOSIS:  Right intertrochanteric hip fracture.   DISCHARGE DIAGNOSES:  1. Right intertrochanteric hip fracture.  2. Superior and inferior pubic ramus fracture on the right.  3. History of hypertension.  4. History of gastroesophageal reflux disease.  5. Sleep apnea.  6. History of diverticulitis with abscess and colostomy.  7. History of acute renal failure.  8. Iron deficiency anemia.  9. Pulmonary emboli.   PROCEDURE:  Open reduction and internal fixation of the right  intertrochanteric hip fracture.   HISTORY:  Tracy Walker is a very pleasant 77 year old white female with a  history of left total knee arthroplasty.  She presented on September 4  with right hip pain after a fall when she was getting out of the car and  she apparently lost balance and fell landing on her right side.  She  complained of immediate pain in her right hip and was brought to the  emergency room where she was noted to have a right superior and inferior  pubic ramus fracture as well as a right intertrochanteric hip fracture.  She was admitted at this time for surgical intervention.   HOSPITAL COURSE:  A 77 year old white female was admitted June 30, 2007, was placed at bedrest with 5 pounds of Bucks traction to the right  leg.  A Foley was placed.  Ice was placed to the right hip.  Dr. Daleen Squibb  was consulted, and Dr. Eden Emms was kind enough to see the patient and  felt that she needed a removable IVC filter.  They felt the Norvasc  should be discontinued and that the patient be placed on Lopressor 25  b.i.d.  Once she was cleared the  next morning she was taken first to x-  ray where they placed the IVC filter.  She was then taken to the  operating room where she underwent a right hip compression screw.  She  tolerated the procedure well.  She was placed on a clear diet the first  day postop because of colostomy.  Ancef 1 gram IV on call was given and  was continued postoperatively for 3 doses.  Coumadin protocol was  restarted.  She had been on Coumadin preoperatively but was  subtherapeutic and that is why the filter was placed.  For pain  management, a Dilaudid reduced-dose PCA pump was used.  Consults for PT,  OT and care management were made with partial weightbearing of 50% of  her body weight.  She was allowed out of bed to a chair the following  day.  She was weaned off her PCA.  Her Foley was continued until  September 8.  Urinalysis was obtained at that time and will be available  post discharge.  She  had an unremarkable hospital course and it was felt  that if bed availability was present she would be transferred to a  nursing facility for her physical therapy and her rehab.  She has had a  fairly benign hospital course and remains in stable condition.   LABORATORY DATA:  She was admitted with hemoglobin of 12.2, hematocrit  35.9, white count 11,300 and platelets 279,000.  Her MCV was 87.8.  Discharge hemoglobin 10.4, hematocrit 30.8, white count 9900, platelets  227,000.  Pro time on admission was 21.1 with an INR of 1.8.  Pro time  on September 8 was 21.3 and INR 1.8.  Preop sodium 144, potassium 4.2,  chloride 111, CO2 27, glucose 121, BUN 14, creatinine 1.20.  GFR was 44.  Total protein was 6.8, albumin 3.5, AST 21, ALT 18, ALK 47, total  bilirubin 0.7.  Discharge sodium 139, potassium 4.4, chloride 106, CO2  29, glucose 104, BUN 11, Creatinine 1.00, GFR was 54.  Blood type was A  positive.  Antibody negative.  She was given 1 unit of blood.   Radiographic studies of September 4 reveal an  intertrochanteric hip  fracture of the right femur with superior and inferior pubic ramus  fractures on the right.  No displacement at any site.  Chest x-ray  showed mild bronchitic changes.  Hip of September 5 reveals ORIF of  right intertrochanteric femur fracture.  EKG of July 02, 2007  reveals a sinus rhythm with occasional premature ventricular complexes  and premature atrial complexes with aberrant conduction; left anterior  fascicular block; abnormal ECG.   DISCHARGE INSTRUCTIONS:  The patient can return to a regular diet.  She  may increased her activity slowly.  She walks with assistance using a  walker 50% weightbearing on the right.  No driving or lifting for 6  weeks.  Keep incision clean and dry and covered with a dressing until  seen in the office in 2 weeks postop.   MEDICATIONS:  1. Percocet 5/325 one to two q.4 h p.r.n. pain.  2. __________  5/325 one to two p.o. q.4 h p.r.n. less severe pain.      Both Percocet and Vicodin cannot be used at the same time.  3. Multivitamin one tab daily.  4. Prilosec 20 mg daily.  5. Ferrous gluconate 325 mg b.i.d.  6. MiraLax 17 grams one p.o. daily to b.i.d.  7. Coumadin as per protocol.  8. Lopressor 25 mg twice a day.  9. Need to contact Dr. Daleen Squibb or Dr. Eden Emms for whether Norvasc should      be continued or not.   She will need to follow back up with Dr. Chaney Malling 2 weeks  postoperatively.  Discharge in improved condition.      Oris Drone Petrarca, P.A.-C.    ______________________________  Lenard Galloway Chaney Malling, M.D.    BDP/MEDQ  D:  07/06/2007  T:  07/06/2007  Job:  [id number]

## 2011-03-12 NOTE — Consult Note (Signed)
NAME:  SAMEKA, BAGENT NO.:  [id number]   MEDICAL RECORD NO.:  [id number]          PATIENT TYPE:  INP   LOCATION:  0106                         FACILITY:  [facility]   PHYSICIAN:  Noralyn Pick. Eden Emms, MD, FACCDATE OF BIRTH:  01-24-1928   DATE OF CONSULTATION:  07/02/2007  DATE OF DISCHARGE:                                 CONSULTATION   Ms. Denise is an unfortunate 77 year old patient   Dictation ended at this point.      Noralyn Pick. Eden Emms, MD, [facility]     PCN/MEDQ  D:  07/02/2007  T:  07/02/2007  Job:  [id number]

## 2011-03-12 NOTE — Op Note (Signed)
NAME:  Tracy Walker, Tracy Walker NO.:  [id number]   MEDICAL RECORD NO.:  [id number]          PATIENT TYPE:  INP   LOCATION:  1537                         FACILITY:  [facility]   PHYSICIAN:  Ardeth Sportsman, MD     DATE OF BIRTH:  05/29/1928   DATE OF PROCEDURE:  DATE OF DISCHARGE:                               OPERATIVE REPORT   PRIMARY CARE PHYSICIAN:  Dr. Lia Hopping.   OTHER SURGEON:  Kathaleen Maser. Cleotis Nipper, M.D.   CARDIOLOGIST:  Jesse Sans. Wall, MD, [facility]   SURGEON:  Ardeth Sportsman, MD.   ASSISTANT:  Thornton Park. Daphine Deutscher, MD   PREOPERATIVE DIAGNOSES:  1. Perforated diverticulitis complicated by wound dehiscence status      post resection  requiring loop colostomy.  2. Parastomal hernia.  3. Ventral incisional hernia.   PROCEDURE PERFORMED:  1. Open lysis of adhesions x 90 minutes.  2. Open loop colostomy takedown with stapled anastomosis.  3. Parastomal hernia repair with a sandwich underlay repair using a 10-      x 15 cm Strattice absorbable mesh.   ANESTHESIA:  General anesthesia.   ESTIMATED BLOOD LOSS:  Less than 50 mL.   DRAINS:  A 15-French Blake drain rests in the preperitoneal plane just  on top of the mesh.   COMPLICATIONS:  None apparent.   INDICATIONS:  [name] is a pleasant obese 77 year old female who had  abdominal pain and perforated diverticulitis that required emergent  colectomy complicated by a wound dehiscence and chronic drainage managed  by Dr. Cleotis Nipper.  She has eventually recovered and had a follow-up  negative colonoscopy.  She wished to have colostomy takedown.  She was  initially sent to Dr. Alfonse Ras, but due to health reasons, she  was not available to do surgery and, therefore, I was asked to assume  care.   The anatomy and physiology of digestion tract was explained.  Pathophysiology of hernia was explained, as well.  Risks, benefits,  alternatives stressed.  Questions answered and recommendation was made  for a  colostomy takedown with a ventral hernia repair, parastomal.  If  the case went well, to consider repair of her ventral incisional hernia  of moderate size, as well.  Risks, benefits and alternatives were  discussed.  Questions were answered and agreed to proceed.   OPERATIVE FINDINGS:  She had a 6 x 8-cm parastomal defect with a large  swath of omentum in most of her right colon and transverse colon up into  the region.  She had numerous Swiss-cheese, large paramedian and a  periumbilical ventral hernia, as well that was reducible and not  incarcerated.  I decided to leave that alone, given the fact that this  was a clean-contaminated case.   DESCRIPTION OF PROCEDURE:  Informed consent was confirmed.  The patient  received IV Invanz just prior to surgery.  She was placed in the oral  Entereg protocol.  She had sequential compression devices active during  the entire case.  She received subcutaneous heparin for DVT prophylaxis  especially given her history of PE in the past.  She underwent  anesthesia without any difficulty.  She had a Foley catheter sterilely  placed.  Her abdomen and ostomy were prepped and draped in a sterile  fashion.   Attention was turned towards her upper quadrant.  While I went ahead and  did a transverse biconcave incision around the skin of the ostomy, I  immediately encountered a giant hernia sac.  Careful sharp and blunt  dissection was done to free around the hernia sac contents and get down  to the fascial edge.  I tried to save some of the hernia sac, began the  breaching into the hernia sac to free up the colon.  Eventually found a  proximal dilated ascending colon and decompressed transverse colon.  I  ended up stapling with a side-to-side anastomosis using a GIA 75 and  then using a TA-60 to staple off the common defect along with the edge  of skin.  This was able to be reduced down.   I initially tried to just free up the peritoneum, but it was  rather  beaten up so I ended up going on the deeper fascial planes, more  contiguous to the retrorectal and transabdominal planes  circumferentially until I got a good healthy swath.  The anterior rectus  fascia and external oblique fascia were left anteriorly.  With that  working space, I went ahead and closed that  layer using a running #1  PDS.  In this deeper fascial plane, I went ahead and placed a 10 x 16  mesh, I later trimmed to 10 x 15 cm of Surgisis mesh.  It was secured to  the external oblique and anterior rectus fascia using interrupted #1 PDS  stitches x 12, taking a bite through the fascia, in and out the mesh,  and then back out the fascia and tied down for a good superficial  underlay repair.  The primary anterior fascial defect was closed using a  #1 looped PDS in a transverse fashion as well.  Please note, a drain had  been placed.  Copious irrigation was done with each layer.  Excess fold  of the skin was trimmed back to loosely approximate, using 4-0 Monocryl  subcuticular  interrupted stitches.  I placed 3 Telfa wicks, also placed  some skin barrier either side, as well as some Adaptic, and then placed  a sponge for a wound VAC to help suck off the excess fluid to encourage  wound drainage.  This was done with a KCI woound vac available to help  decrease the risk of hematoma and seroma formation and drainage to allow  the area to heal.   The patient was extubated and sent to recovery room in stable condition.   After discussing with Dr. Daphine Deutscher,  even though she had  large paramedian  and periumbilical incisional hernias, there was a large defect with a  fair amount of loss  dominion and I felt that she would be better served  with a more  permanent or a laparoscopic repair using more permanent mesh and risk of  wound, given the fact of her history of prior other wound infections, I  rather did this in stepwise fashion since she is not incarcerated, at  this  time.   I discussed postoperative care with the patient and I will discuss it  with her family as well.      Ardeth Sportsman, MD  Electronically Signed     SCG/MEDQ  D:  02/06/2009  T:  02/06/2009  Job:  [id number]   cc:   Lia Hopping  Fax: [phone]   Jesse Sans. Wall, MD, FACC  [address]  [address]  [place]  [place] [address]   Kathaleen Maser. Cleotis Nipper, M.D.  Fax: [phone]

## 2011-03-12 NOTE — H&P (Signed)
NAME:  Tracy Walker, Tracy Walker NO.:  [id number]   MEDICAL RECORD NO.:  [id number]          PATIENT TYPE:  INP   LOCATION:  2013                         FACILITY:  [facility]   PHYSICIAN:  Jesse Sans. Wall, MD, FACCDATE OF BIRTH:  May 17, 1928   DATE OF ADMISSION:  05/09/2007  DATE OF DISCHARGE:                              HISTORY & PHYSICAL   PRIMARY CARE PHYSICIAN:  Delaney Meigs, M.D.   PRIMARY CARDIOLOGIST:  Rainey Pines. Wall, MD, FACC   HISTORY OF PRESENT ILLNESS:  This is a 77 year old Caucasian female who  was recovering at nursing home in [place], whether she had sudden onset of  chest pain this morning, which she described as severe and sharp.  The  patient passed out.  Witnesses state that she had a shaking spell and a  possible seizure, and they placed her on her bed.  She woke up  complaining of severe epigastric and mid sternal chest pain,  nonradiating, with some mild shortness of breath.  She was found to be  tachycardic.  EMS was called, and the patient was brought to the ER.  She was diagnosed with a Code STEMI and brought emergently to [facility]  Emergency Room, at which time, EKG was seen and evaluated by ER  physician and Dr. Tonny Bollman, cardiology interventionalist and found  to be in right bundle branch block, and Code STEMI was canceled.  The  patient continues to have severe substernal chest discomfort with some  mild low-grade nausea and abdominal pain.  The patient is tachycardic  with a ventricular rate of 130 beats per minute.  Blood pressure is  102/57.  She is sating at 100% with a respiratory rate of 22 beats per  minute.  EKG completed revealed right bundle branch block with no acute  ST-T wave changes indicative of ischemia; however, the right bundle  branch block is new from prior EKG in April of 2008, which may be rate-  related.  The patient was given morphine and oxygen, and the pain is  somewhat relieved.  Chest x-ray was completed  and found to be normal.  There was no evidence to suggest PE at this time.   The patient has had a recent hospitalization at [facility] for  large abdominal wound related to a subcutaneous perforation of her colon  causing abdominal wall infection.  The patient has had recent surgery to  the left abdominal wall with packing and has been seen by Dr. Cleotis Nipper  concerning this.  This occurred on May 05, 2007.  The patient was  recovering at nursing home and continued on antibiotics and also had  wound packing continued.  The patient was recovering without any  difficulty until this a.m.  Initial cardiac enzymes when drawn here  revealed elevated troponin at 0.16, myoglobin of 156, and CK-MB of 3.2.  The patient was seen and examined by Dr. Juanito Doom.  Code STEMI was  canceled as well, and the patient is now being treated for dehydration  and pain control.   PAST MEDICAL HISTORY:  1. Acute renal failure.  2. Abdominal abscess.  3. Iron deficiency anemia.  4. Hypertension.  5. Deconditioning and evidence of dehydration in the past.   PAST SURGICAL HISTORY:  1. Recent abdominal surgery secondary to bowel abscess.  The patient      does have a colostomy.  2. Cholecystectomy.  3. Hiatal hernia repair in 1977.  4. Hysterectomy in 1962.  5. Surgery for cataracts in both eyes.  6. ORIF of the left knee with subsequent left knee arthroscopy.  7. Hardware removal by Dr. Elana Alm. Wainer.  The patient denies any      complications associated.   FAMILY HISTORY:  Mother deceased from pneumonia and CHF.  Father  deceased of lung disease.  She has no brothers or sisters.   CURRENT LABORATORY:  Hemoglobin 12.5, hematocrit 37.5, white blood cells  16.1, platelets 353.  I-STAT cardiac markers:  Troponins 0.16, CK-MB  3.2, myoglobin 156.  BMET is pending.  Chest x-ray was normal without  any acute cardiopulmonary process.  EKG reveals right bundle branch  block.  Ventricular rate of 130  beats per minute.   VITAL SIGNS:  Blood pressure 102/61, heart rate 132, respirations 24.  HEENT:  Head is normocephalic and atraumatic.  Eyes:  PERRLA.  Mucosal  membranes in mouth pink and moist.  Tongue is midline.  NECK:  Supple.  There is no JVD or carotid bruits appreciated.  CARDIOVASCULAR:  Tachycardic without murmurs, rubs, or gallops.  LUNGS:  Clear to auscultation.  ABDOMEN:  Soft.  She is having abdominal tenderness up into the  epigastric area.  She does have 2+ bowel sounds.  A colostomy is noted  on the right with abdominal wound packing noted on the left.  EXTREMITIES:  Without clubbing, cyanosis, or edema.  SKIN:  Very pale and cool, dry.  NEURO:  Cranial nerves II-XII are grossly intact.   IMPRESSION:  1. Chest pain.      a.     Subscapular chest pain, sharp and sudden.  Does not appear       to be acute myocardial infarction at this time.  New right bundle       branch block since April 2008.  Troponin 0.16 per point of care.       There is no previous cardiac history.  2. Sinus tachycardia with right bundle branch block, probable rate-      related.  3. Rule out dehydration.  4. Rule out pulmonary embolism.  5. Rule out transient ischemic attack.   PLAN:  This is a 77 year old Caucasian female we are admitting for  subscapular chest pain with recent surgery to her abdomen secondary to  abdominal abscess.  The patient appears dehydrated.  There is no  evidence of acute myocardial infarction at this time.  The patient will  be hydrated with fluids, will cycle cardiac enzymes, do echocardiogram  for evaluation of left ventricular function.  If decrease in cardiac  enzymes are positive, we will  proceed with cardiac catheterization.  In the interim, we will get blood  cultures, urinalysis, and cultures and sensitivity to evaluate,  secondary to elevated white blood cells and dehydration.  The patient  will continue on her current medications, to be placed on  telemetry and  monitored closely.      Bettey Mare. Lyman Bishop, NP      Jesse Sans. Daleen Squibb, MD, [facility]  Electronically Signed    KML/MEDQ  D:  05/09/2007  T:  05/09/2007  Job:  [id number]  cc:   Delaney Meigs, M.D.

## 2011-03-12 NOTE — Discharge Summary (Signed)
NAME:  Tracy Walker, Tracy Walker NO.:  [id number]   MEDICAL RECORD NO.:  [id number]          PATIENT TYPE:  INP   LOCATION:  5028                         FACILITY:  [facility]   PHYSICIAN:  Lenard Galloway. Mortenson, M.D.DATE OF BIRTH:  1927-11-22   DATE OF ADMISSION:  03/28/2008  DATE OF DISCHARGE:  04/04/2008                               DISCHARGE SUMMARY   ADMISSION DIAGNOSES:  Infected wound, status post right unipolar hip  replacement.   DISCHARGE DIAGNOSES:  1. Infected wound, status post right unipolar hip replacement.  2. History of pulmonary embolism.  3. Hypertension.  4. Sinus bradycardia.  5. Iron-deficiency anemia.  6. Gastroesophageal reflux disease.  7. Sleep apnea.  8. Methicillin-resistant Staphylococcus aureus.  9. Acute blood loss anemia.   PROCEDURE:  I&D of right hip wound, with wound VAC applied, March 29, 2008.   HISTORY:  Ms. Bellavance is a 77 year old white female who was admitted March 28, 2008 from the office with a draining wound of the right hip.  She had  undergone a hip compression screw of the right hip previously, and this  had been removed because of pain.  This was for an intertrochanteric hip  fracture.  She apparently complained of pain in the groin.  Recheck x-  ray postop revealed a femoral neck fracture, and she was taken back to  the operating room, and a right unipolar hip replacement was performed.  She was seen in the office for staple removal and was noted to have  drainage from the wound, and the wound was infected.  She was then  admitted urgently to the hospital on March 28, 2008 for surgical  intervention.   HOSPITAL COURSE:  A 77 year old white female admitted March 28, 2008.  After appropriate laboratory studies were obtained, she was taken to the  operating room on March 29, 2008, where she underwent am I&D and placement  of a VAC to the right hip wound.  She tolerated the procedure well.  Infectious disease was consulted for  antibiotics.  Consultations with  PT, OT, and care management were obtained, with 50% weightbearing in PT.  Lovenox 40 mg subcutaneously q.24 h. was started again.  She has been  covered with during her postop stay course.  A reduced-dose Dilaudid PCA  pump was ordered.  She was started on vancomycin 1 g IV q.12 h, and  Zosyn 3.375 q.8 h.  Pharmacy increased her vancomycin to 1250 mg q.24 h.  Postop day #1, she was weaned off of her O2 and her PCA.  She had her  Zosyn discontinued by infectious disease and placed on rifampin 300 mg  p.o. b.i.d.  She did grow MRSA from the wound.  She had some  midepigastric discomfort on March 31, 2008 and was placed on Protonix 40  mg daily.  A PICC line was placed on the fourth.  Her VAC continued to  do well in clearing the infectious process.  On April 01, 2008, she  developed excruciating pain, and initially it was thought that it was  from a cardiac standpoint.  A stat portable  chest x-ray was ordered as  well as cardiac enzymes and initially a transfer to the telemetry unit.  They had given her 2 nitroglycerin, without help.  At that time, the  question of whether there was a PE was thought of, and a CT arteriogram  was ordered of the chest.  Stat consult with cardiology was obtained  prior to our arrival to the floor.  Once on arrival to the floor, it was  noted that her pain was actually midepigastric.  A stat KUB was ordered.  She was given a GI cocktail, 15 mL, on April 01, 2008, and she had  improvement of her pain.  The CTA was put on hold, as well as the  transfer to telemetry.  She did have her VAC changed on Mondays,  Wednesdays, and Fridays.  She had marked improvement overall.  Her wound  at the time of discharge showed minimal erythema about the edges.  She  had good granulation tissue.  The wound VAC was placed again.  She is  considered now for discharge to SNF.   RADIOGRAPHIC STUDIES:  Portable chest of March 31, 2008 revealed no acute   chest findings.  Abdomen of April 01, 2008 revealed nonspecific bowel gas  pattern, right hip arthroplasty, and the presence of an IVC filter.  Portable chest of March 31, 2008 revealed PICC line in the lower SVC, no  pneumothorax.  Chest x-ray on March 28, 2008 showed no active disease.  EKG on March 28, 2008 revealed sinus bradycardia, left axis deviation,  left ventricular hypertrophy.  April 01, 2008 revealed sinus bradycardia  with premature atrial complexes, with minimal voltage criteria for LVH.  On April 02, 2008, EKG revealed normal sinus rhythm, voltage criteria for  left ventricular hypertrophy, with nonspecific T-wave abnormalities.   LABORATORY STUDIES:  Admission laboratory studies revealed a hemoglobin  of 10.7, hematocrit 32%, white count 12,400, platelets 241,000.  Discharge hemoglobin 9.1, hematocrit 26.9, white count 4600, platelets  277,000.  Pro time upon admission was 15.7, INR 1.2, PTT 41.  ESR was 52  on March 29, 2008.  Chemistries revealed sodium preoperatively of 136,  potassium, 4.8, chloride 105, CO2 24, glucose 138, BUN 26, creatinine  1.59, GFR 31, bilirubin total 0.8, alk phos 76, SGOT 12, SGPT 13, total  protein 5.9, albumin 2.9, calcium 8.7.  Discharge sodium 141, potassium  4.1, chloride 108, CO2 29, glucose 116, BUN 10, creatinine 0.95, GFR was  57, and calcium was 8.6.  Cardiac enzymes on April 01, 2008 revealed CPK  total 11, MBs 0.7, troponin 0.01.  They essentially remained unchanged  for three samples.  Urinalysis was benign for voided urine.  Cultures  showed methicillin-resistant Staphylococcus aureus.  Blood culture  showed no growth.  C-reactive protein was 21.3.   DISCHARGE INSTRUCTIONS:  She is discharged on a heart-healthy diet.  She  may ambulate 50% weightbearing as tolerated on the right.  This is to be  done with a walker.  The VAC needs to be changed Monday, Wednesday, and  Friday as per protocol.  No driving or lifting for 6 weeks.   MEDICATIONS:   1. Lovenox 40 mg subcutaneously daily for 10 more days.  2. Catapres 0.2 mg daily.  3. Iron 658 daily.  4. Hydrochlorothiazide 12.5 daily.  5. Caltrate 600 mg daily.  6. Colace 100 gram at bedtime.  7. Labetalol 300 mg b.i.d.  8. Multivitamin 1 daily.  9. Hydrocodone 5/325, 1-2 tabs q.4 h. p.r.n., severe  pain she can use      2 tabs, mild pain she can use 1 tablet.  10.Vancomycin 1250 mg q. 8 p.m., and the vancomycin protocol should be      obtained per pharmacy, and their dosing should be used.  11.Rifampin 300 mg p.o. b.i.d.  12.Protonix 40 mg p.o. daily.   She needs to be followed up in our office on Monday or Wednesday of next  week for recheck evaluation.  Please bring the [facility] material with her, so  that we may change that on the day of her appointment.  She was  discharged in  improved condition.  I have discussed this with infectious disease, and  they want to continue her on the vancomycin and the rifampin.  Also need  to call Dr. Johny Sax, infectious disease at [facility],  for a follow-up appointment with him also.  She was discharged in  improved condition.      Oris Drone Petrarca, P.A.-C.      Rodney A. Chaney Malling, M.D.  Electronically Signed    BDP/MEDQ  D:  04/04/2008  T:  04/04/2008  Job:  [id number]

## 2011-03-12 NOTE — Op Note (Signed)
NAME:  Tracy Walker, WHITACRE NO.:  [id number]   MEDICAL RECORD NO.:  [id number]          PATIENT TYPE:  INP   LOCATION:  1619                         [facility]ACILITY[facility]   PHYSICIAN:  Lenard Galloway. Mortenson, M.D.DATE OF BIRTH:  Mar 26, 1928   DATE OF PROCEDURE:  07/03/2007  DATE OF DISCHARGE:                               OPERATIVE REPORT   PREOPERATIVE DIAGNOSIS:  Intertrochanteric fracture, right hip.   POSTOPERATIVE DIAGNOSIS:  Intertrochanteric fracture, right hip.   OPERATION:  Open reduction and internal fixation using compression screw  with four-hole 135-degree side plate with 85 mm compression screw.   ANESTHESIA:  General.   SURGEON:  Rodney A. Chaney Malling, M.D.   ASSISTANT:  Legrand Pitts. Duffy, P.A.   PROCEDURE:  The patient placed on a fracture table in supine position.  After satisfactory general anesthesia, the right hip was prepped with  DuraPrep and draped out in the usual manner.  A barrier drape was  applied.  Incision made starting at the level of the lesser trochanter,  carried distally for about 4 inches.  Skin edges were retracted.  Bleeders were coagulated.  Tensor fascia lata was split, as was the  vastus lateralis.  A drill hole was placed in the lateral cortex of the  trochanter.  A guide pin was passed up the femoral neck into the femoral  head using C-arm localization.  A step-cut cortical drill was then used.  The appropriate-length screw was selected and this was driven up into  the femoral head, and this was checked with radiographs and it seated  nicely in the femoral head.  A 135-degree four-hole side plate was then  slid onto the compression screw.  It was held in position, drill holes  were made, the appropriate-length screws were applied, and excellent  fixation of the side plate and the compression screw was achieved.  Compression was then applied to the lag screw.  Caps were placed over  the screws in the lag sliding screw.  The vastus  lateralis was closed  with Vicryl, heavy Vicryl was used to close the tensor fascia lata, and  Vicryl was used to close the subcutaneous tissue and skin was closed  with stainless steel staples.  Sterile dressings were applied and the  patient returned to the recovery room in excellent condition.  Technically this procedure went extremely well.   DRAINS:  None.   COMPLICATIONS:  None.           ______________________________  Lenard Galloway. Chaney Malling, M.D.     RAM/MEDQ  D:  07/03/2007  T:  07/03/2007  Job:  [id number]

## 2011-03-12 NOTE — Consult Note (Signed)
NAME:  Tracy Walker, Tracy Walker NO.:  [id number]   MEDICAL RECORD NO.:  [id number]          PATIENT TYPE:  INP   LOCATION:  5028                         FACILITY:  [facility]   PHYSICIAN:  Luis Abed, MD, FACCDATE OF BIRTH:  [birth year]   DATE OF CONSULTATION:  DATE OF DISCHARGE:                                 CONSULTATION   Ms. Tracy Walker is 77 years old.  We were called and asked to consult urgently  for chest discomfort and what was thought to be junctional rhythm.  The  patient has a very complex medical history, which is outlined below.  There is a history in the past of pulmonary emboli around the time of a  significant illness when she was hospitalized.  There is no history of  significant coronary disease.  There is a history of good left  ventricular function.   The patient has had a very complicated course related to difficulties  with her right hip.  She had a fracture in 2008, and this was treated.  Unfortunately, she has had continued problems over time and had another  fracture, and most recently was here for I&D of the sight in her right  hip.  This has already been done, and this morning she developed some  epigastric discomfort, and an EKG was done, and there was a question of  junctional rhythm, and we were called to see her urgently.  When our  team arrived, she was stable.  The EKG in fact shows sinus bradycardia.  There are no acute ST changes.   At this time, she is not having any continued chest discomfort.  She is  not short of breath.  She is not nauseated, but has continued epigastric  discomfort.  She has not had any syncope or presyncope.   PAST MEDICAL HISTORY:   ALLERGIES:  1. CODEINE.  2. ADHESIVES.   MEDICATIONS:  1. Os-Cal.  2. Clonidine 0.2.  3. Colace.  4. Enoxaparin 40 mg subcutaneously daily.  5. Iron.  6. Hydrochlorothiazide 12.5.  7. Dilaudid p.r.n.  8. Labetalol 300 b.i.d.  9. Multivitamin.  10.Protonix.  11.Rifampin 300  b.i.d.  12.Vancomycin 1250 mg.   OTHER MEDICAL PROBLEMS:  See the extensive list in the problem list  below.   REVIEW OF SYSTEMS:  As of this morning, she is not having any fevers,  chills, or sweats.  She has no headache.  She is not having any  dizziness.  She is not having any visual problems.  She is hearing  without difficulty.  Her skin reveals no acute rashes.  She does have  changes on the skin in her hip.  She has no shortness of breath.  There  are no palpitations.  She has no wheezing.  There is no burning in her  urine.  She is fully alert at this time as I am seeing her.  She is not  having any major muscle aches and pains.  As mentioned in the HPI, she  is having epigastric discomfort.  Otherwise, her review of systems is  negative.   PHYSICAL EXAMINATION:  VITAL SIGNS:  Temperature is 97.4, blood pressure  is 178/72, with a pulse of 60, respirations are 18.  GENERAL:  The patient is fully alert.  She is oriented to person, time,  and place.  Affect reveals that she is somewhat upset about her current  situation.  Her communication is clear.  HEENT:  Reveals no xanthelasma.  She has normal extraocular motion.  VITAL SIGNS:  There are no carotid bruits.  There is no jugular venous  distention.  LUNGS:  Clear.  Respiratory effort is not labored.  CARDIAC:  Reveals an S1 with an S2.  There are no clicks or significant  murmurs.  There is no pain to palpation of the chest.  There is some  pain to palpation of the epigastrium.  ABDOMEN:  Evaluation of the abdomen reveals that she does have bowel  sounds.  She has a colostomy in her lower abdomen.  EXTREMITIES:  Her hip lesion on her right leg is dressed appropriately.  There is no significant peripheral edema.   EKG reveals sinus bradycardia.  There are no diagnostic QRS changes.  Hemoglobin is 9.1.  White count is 4600.  BUN is 10, and creatinine is  0.95, with glucose 116, and potassium 4.1, and sodium 141.  A chest  x-  ray had been done and showed no acute changes.  An abdominal film is to  be done shortly, and the results will be going to the orthopedic team.   While in her room, I spoke with the patient's son by telephone.  I  explained to him that I felt her cardiac status was stable, and I  explained to him the events of the morning, and that I felt her cardiac  was stable and that her other issues were being evaluated by the  orthopedic team.   PROBLEMS:  1. History of bilateral pulmonary emboli in July 2008.  At that time,      she had an IVC filter.  She is not on Coumadin at this time.  This      issue has been carefully reviewed.  I am assuming that the      pulmonary emboli were related to her illness at that time.  She is      on prophylactic Lovenox at this time.  2. Normal left ventricular function by history in the past, by      echocardiogram in July 2008.  3. History of a perforated diverticulum in the past with an abscess,      leading to colostomy which remains in place.  4. History of iron-deficiency anemia.  5. History of gastroesophageal reflux disease.  6. History of hypertension.  She is on significant medications.  7. History as related in the history of present illness of severe      ongoing problems with her right hip, most recently undergoing I&D      for the hip.  8. History of sleep apnea.  9. History of renal failure at the time that she had her surgery with      her acute illness in July 2008.  10.Status post cataract surgery.  11.Status post multiple left knee surgeries.  12.History of significant hiatal hernia, and I believe there is a      question that she has had a Nissen fundoplication.  13.History of lumbar surgery.  14.**Current epigastric discomfort and some bradycardia.  There was      question of junctional rhythm.  This is not a  junctional rhythm.      The P-wave amplitude is low, and there is sinus bradycardia.  This      is in the face of a high  blood pressure.  This does not warrant any      further evaluation.  Her epigastric discomfort does not appear to      be cardiac.  We will follow her.  Her EKG reveals no acute change.      She does not need any acute intervention for her discomfort.   There were multiple interactions with other healthcare members during  this consultation.  We were in touch with the patient's orthopedic  physician and orthopedic physician's assistant concerning the case.  We  spoke in person and by telephone.  As mentioned, I talked with the  patient's son by telephone also.  He came to see the patient urgently.  Fortunately, her cardiac status is stable.      Luis Abed, MD, [facility]  Electronically Signed     JDK/MEDQ  D:  04/01/2008  T:  04/01/2008  Job:  [id number]   cc:   Thomas C. Wall, MD, [facility]  Lia Hopping

## 2011-03-12 NOTE — Op Note (Signed)
NAME:  Tracy Walker, Tracy Walker              ACCOUNT NO.:  [id number]   MEDICAL RECORD NO.:  [id number]          PATIENT TYPE:  AMB   LOCATION:  SDS                          FACILITY:  [facility]   PHYSICIAN:  Rodney A. Mortenson, M.D.DATE OF BIRTH:  January 27, 1928   DATE OF PROCEDURE:  02/25/2008  DATE OF DISCHARGE:  02/25/2008                               OPERATIVE REPORT   PREOPERATIVE DIAGNOSIS:  Painful compression screw, right hip.   POSTOPERATIVE DIAGNOSIS:  Painful compression screw, right hip.   PROCEDURE:  Removal of compression screw, right hip (complex), aerobic  and anaerobic cultures.   SURGEON:  Lenard Galloway. Chaney Malling, MD   ASSISTANT:  __________   ANESTHESIA:  General.   PROCEDURE:  The patient was placed on the operating table in supine  position.  After satisfactory general anesthesia, the right lower  extremity prepped with DuraPrep and draped out in the usual manner.  Vi-  Drape was placed over the operative site.  Incision made starting in the  area of previous incision.  Skin edges were retracted.  Bleeders were  coagulated.  Fibers of the tensor fascia lata split and the fibers of  vastus lateralis were also split and this was carried down to the side  plate over the proximal femur.  Self-retaining retractor was put in  place.  Bleeders were coagulated.  The side plate and screw were cleaned  up.  It is a fairly deep wound as it took fair amount of dissecting.  A  powered screwdriver was used to remove 4 screws from the side plate.  The side plate was then removed.  There was a large screw through the  femoral neck into the head and this was backed up.  Cultures of the  cannulated screw and hole in the neck and head was obtained and was sent  for aerobic and anaerobic cultures.  The wound was irrigated with  copious amounts of saline solution.  Dissection was very complex as this  was a very deep wound.  The patient thigh was extremely large.  The leg  was then closed in  multiple layers starting with the fibers of the  vastus lateralis closed with the interrupted Vicryl and Vicryl was used  to close the tensor fascia lata and Vicryl was used to close  subcutaneous tissue and stainless steel staples were used to close the  skin.  Sterile dressing was applied and the patient returned to recovery  room in excellent condition.  Technically, this went very well, but this  a very large patient and this took an extended period of time.   DRAINS:  None.   COMPLICATIONS:  None.   DISPOSITION:  1. Discharge.  2. Weightbearing as tolerated with a walker.  3. Return to my office on Wednesday.      Rodney A. Chaney Malling, M.D.  Electronically Signed     RAM/MEDQ  D:  02/25/2008  T:  02/26/2008  Job:  [id number]

## 2011-03-12 NOTE — Discharge Summary (Signed)
NAME:  Tracy Walker, VIG NO.:  [id number]   MEDICAL RECORD NO.:  [id number]          PATIENT TYPE:  INP   LOCATION:  2013                         FACILITY:  [facility]   PHYSICIAN:  Dorian Pod, ACNP  DATE OF BIRTH:  July 14, 1928   DATE OF ADMISSION:  05/09/2007  DATE OF DISCHARGE:                               DISCHARGE SUMMARY   ADDENDUM  To discharge summary [id number]   Dictator says the discharge summary she dictated needs to be a priority  discharge summary.      Dorian Pod, ACNP     MB/MEDQ  D:  05/13/2007  T:  05/13/2007  Job:  [id number]

## 2011-03-12 NOTE — Op Note (Signed)
NAME:  Tracy Walker, Tracy Walker NO.:  [id number]   MEDICAL RECORD NO.:  [id number]          PATIENT TYPE:  INP   LOCATION:  5002                         FACILITY:  [facility]   PHYSICIAN:  Lenard Galloway. Mortenson, M.D.DATE OF BIRTH:  1928/10/22   DATE OF PROCEDURE:  03/10/2008  DATE OF DISCHARGE:                               OPERATIVE REPORT   PREOPERATIVE DIAGNOSIS:  Subcapital fracture, right hip.   POSTOPERATIVE DIAGNOSIS:  Subcapital fracture, right hip.   OPERATION:  Unipolar hemiarthroplasty, right hip 13.5-mm smallest  stature femoral component with a 44-mm unipolar head -3.   SURGEON:  Lenard Galloway. Chaney Malling, MD.   Threasa HeadsArlys John D. Petrarca, PA-C.   ANESTHESIA:  General.   PROCEDURE:  The patient was placed on the operative table in supine  position.  After satisfactory general anesthesia, the staples were  removed from the lateral side of the hip.  The hip was then put in a  full lateral position and braced in position.  The entire left lower  extremity was prepped with DuraPrep and draped out in the usual manner,  and a Vi-Drape was placed over the operative site.  Incision was made  through the previous incision where the hip screw was removed  approximately 7 or 8 days ago.  Incision was then carried proximally  following a 7 approach.  Self-retaining retractors were put into place.  Bleeders were coagulated.  Tensor fascia lata was split.  External hip  rotators identified, isolated, tagged, and released and swept to the  midline.  There was a fair amount of edema about to the proximal portion  of the hip.  Incision was made through the posterior hip capsule from  superior to inferior and then extended up to the femoral neck to the  level of the fracture.  The capsule was then opened.  The femoral head  fracture could be seen and the hip was dislocated, and femoral head  removed and measured and this measured 44 mm in diameter.  A retraction  was placed on the  neck of the femur and the neck was cut the appropriate  length and angled using the angle guide.  A power saw was used.  Once  this was accomplished, a C-arm was brought in position.  A drill hole  was placed at the base of the neck on superior side, and canal finder  was passed down the canal.  The C-arm was used and she had a screw  removed from the hip 7 or 8 days ago.  We did not want false passage.  Once this was accomplished to my satisfaction, a series of reamers were  passed down using a fluoroscope.  This was reamed out to 14.  A series  of broaches were then passed down the femoral canal starting with 10 mm  standard size and this fit fairly tightly.  The 13.5-mm broach was then  selected and passed down the femoral canal, and this actually seated  very tightly.  It was determined that the larger 15 mm might be too  large even though the distal femoral canal  was profoundly overreamed.  This seated very nicely.  X-rays were taken throughout, this seated  nicely in a calcar and filled most of the canal.  This was extremely  stable.  The 44-mm head with a + neck and then a -3 neck was used, and  the hip reduced and put through full range of motion.  A 44-mm head  seated very nicely and was it perfectly stable.  Great deal of time was  spent in this dissection.  There was a lot of edema and x-rays were used  constantly to assess position of the femoral component.  All the  components were removed.  Debris removed from the acetabulum.  The hip  was irrigated with copious amounts of saline solution throughout the  procedure.  The final components were selected and the femoral component  driven down the femoral canal and this seated nicely in the calcar with  anteversion.  Appropriate length neck and 44-mm ball was articulated and  the hip reduced.  Reduction was somewhat difficult and once it was  reduced, it was extremely stable.  X-rays were taken and put on the  chart.  Posterior  hip capsule was then closed with heavy Tycron sutures.  Excellent hip rotators were reattached with Tycron.  The vastus  lateralis was closed with a running Vicryl and Tycron used to close to  the tensor fascia lata, Vicryl was used to close the deep layers in  several layers as the patient was quite large.  The skin was then closed  with stainless steel staples.  Sterile dressings were applied.  Prior to  closure, FloSeal was used, and the wound was dry.  The patient was then  returned recovery room in excellent condition.  Technically, this went  extremely well, although it took an extended period of time, is really  fairly difficult, but the final construct, I was extremely pleased with  position, stability, and fit.   DRAINS:  None.   COMPLICATIONS:  None.      Rodney A. Chaney Malling, M.D.  Electronically Signed     RAM/MEDQ  D:  03/10/2008  T:  03/11/2008  Job:  [id number]

## 2011-03-15 NOTE — Discharge Summary (Signed)
NAME:  Tracy, Walker NO.:  [id number]   MEDICAL RECORD NO.:  [id number]          PATIENT TYPE:  ORB   LOCATION:  4526                         FACILITY:  [facility]   PHYSICIAN:  Ranelle Oyster, M.D.DATE OF BIRTH:  [birth year]   DATE OF ADMISSION:  08/17/2004  DATE OF DISCHARGE:  08/24/2004                                 DISCHARGE SUMMARY   DISCHARGE DIAGNOSES:  1.  Left total knee replacement.  2.  Postoperative anemia.  3.  Hypertension.   HISTORY OF PRESENT ILLNESS:  Tracy Walker is a 77 year old female with a  history of hypertension, OA left knee with tibial fracture in 2003, with  subcutaneous ORIF, hardware removal, but continued pain requiring left total  knee replacement secondary to severe traumatic OA on August 14, 2004 by Dr.  Thereasa Distance A. Mortenson.  Postoperatively, she is partially weightbearing and on  Lovenox for DVT prophylaxis.  Issues with surgery with increased problems of  anemia and hyperkalemia.  Pain control has been reasonable.  Therapies were  initiated and the patient is noted to be at minimal assist for bed mobility,  supervision for transfers, minimal to guarded assist to ambulate 50 feet.  Abilities requires help for upper body care and minimal assist for lower  body care.   PAST MEDICAL HISTORY:  1.  Significant for left knee ORIF with removal of hardware.  2.  Hiatal hernia requiring Nissen in 1977.  3.  Hypertension.  4.  Cholecystectomy in 1964.  5.  Hysterectomy in 1962.  6.  Low back surgery.  7.  Excision of cataract.  8.  Sleep apnea.  9.  Dyslipidemia.   ALLERGIES:  No known drug allergies.   SOCIAL HISTORY:  The patient is widowed.  Lives in one level home with two  step at entry.  Was independent prior to admission.  She does not use any  tobacco or alcohol.   HOSPITAL COURSE:  Tracy Walker was admitted to the [facility] on August 17, 2004 for inpatient therapies to consist of PT, OT daily.  Past admission,  the  patient was maintained on subcutaneous Lovenox for DVT prophylaxis and  iron for postoperative anemia.   Follow up of her CBC showed H&H to be stable at 9.8 and 28.7.  Check of  electrolytes revealed sodium of 139, potassium 4.8, chloride 102, CO2 32,  BUN 14, creatinine 1.0, glucose 107.  A UA/UC showed bacteria; however, a  urine culture showed 40,000 colonies and significant growth.  The patient's  knee incision was found to be healing well without any signs or symptoms of  infection, drainage, or erythema noted.  The staples remained intact and  home health nurse to DC staples on August 27, 2004.   The patient's p.o. intake has been stable.  Blood pressures overall are  controlled.  During stay in subacute, the patient is to problem is to modify  independent level for transfer, modify independent to increase with rolling  walker.  She is supervision for car transfers.  The patient is at modified  independent ADLs including toileting.  Further follow-up  therapies to  include home health, PT, and OT per [facility].  The  family is to provide intermittent assistance as needed.   CONDITION ON DISCHARGE:  On August 24, 2004, the patient is discharged to  home.   DISCHARGE MEDICATIONS:  1.  Lovenox 30 mg subcutaneously b.i.d.  2.  Sular 10 mg q.d.  3.  ____________ 20 mEq q.d.  4.  Trinsicon 1 p.o. b.i.d.  5.  Diovan/hydrochlorothiazide 80/12.5 mg q.d.  6.  Senokot S 2 p.o. b.i.d.  7.  Vicodin 1-2 q.4-6h. p.r.n. pain.   ACTIVITY:  Partially weight on left lower extremity.  Use walker.   DIET:  Regular.   WOUND CARE:  Keep area clean and dry.   SPECIAL INSTRUCTIONS:  No smoking and no driving.   FOLLOW UP:  The patient is to follow up with Dr. Lenard Galloway. Mortenson in two  weeks.  Follow up with Dr. Delaney Meigs for routine check and check of  potassium level.  Follow up with Dr. Ranelle Oyster as needed.       PP/MEDQ  D:  08/24/2004  T:   08/25/2004  Job:  [id number]   cc:   Delaney Meigs, M.D.  [address]  [place]  [place] [address]  Fax: [phone]   Thereasa Distance A. Chaney Malling, M.D.  [address] [place]  [place] [address]  Fax: [phone]

## 2011-03-15 NOTE — Op Note (Signed)
[place]  Patient:    Tracy Walker, Tracy Walker Visit Number: [id number] MRN: [id number]          Service Type: SUR Location: 3000 [id number] Attending Physician:  Danella Penton Dictated by:   Tanya Nones. Jeral Fruit, M.D. Proc. Date: 09/18/01 Admit Date:  09/18/2001                             Operative Report  PREOPERATIVE DIAGNOSIS:  Right L3-4 herniated disk with a fragment.  Right L3, L4, and L5 stenosis.  POSTOPERATIVE DIAGNOSIS:  Right L3-4 herniated disk with a fragment.  Right L3, L4, and L5 stenosis.  PROCEDURE:  Right L3-4 hemilaminectomy, removal of calcified disk at the level of 3-4, 3-4 and 4-5 foraminotomy.  Microscope.  SURGEON:  Tanya Nones. Jeral Fruit, M.D.  ASSISTANTMena Goes. Franky Macho, M.D.  HISTORY OF PRESENT ILLNESS:  The patient is being admitted because of severe low back pain with radiation down to the right leg associated with weakness of the quadriceps.  X-ray showed degenerative disc disease with a stenosis at the level of 4-5, and a fragment at the level of 3-4, mostly at the level of the body.  Surgery was advised.  The risks were explained in the history and physical.  DESCRIPTION OF PROCEDURE:  The patient was taken to the operating room, and he was placed on monitors.  A midline incision from L3-4 was made.  X-rays showed that indeed we were at that area.  We brought the microscope into the area, and with the drill we removed the hemilamina of L4 and L3.  We did L4 laminotomy at L4-5.  We went up and we found the L3-4 space.  We indeed ______ the C5.  There was a large disk which was calcified and vascularized. Removal was done.  We entered the disk space itself.  With diskectomy with removal of quite a bit of degenerative disk was achieved.  At the end we had good hemostasis.  Investigation of bone below was negative.  The area was irrigated.  Valsalva maneuver was negative.  Phentanyl and Depo-Medrol were left in the  epidural space, and the wound was closed with Vicryl and Steri-Strips. Dictated by:   Tanya Nones. Jeral Fruit, M.D. Attending Physician:  Danella Penton DD:  09/18/01 TD:  09/20/01 Job: [id number]

## 2011-03-15 NOTE — Discharge Summary (Signed)
NAME:  Tracy Walker, Tracy Walker NO.:  [id number]   MEDICAL RECORD NO.:  [id number]          PATIENT TYPE:  INP   LOCATION:  5025                         FACILITY:  [facility]   PHYSICIAN:  Lenard Galloway. Mortenson, M.D.DATE OF BIRTH:  03-19-1928   DATE OF ADMISSION:  08/14/2004  DATE OF DISCHARGE:  08/17/2004                                 DISCHARGE SUMMARY   ADMISSION DIAGNOSES:  1.  Traumatic osteoarthritis, left knee status post fracture repair by Dr.      Salvatore Marvel.  2.  Hypertension.  3.  Hyperlipidemia.  4.  Sleep apnea.   DISCHARGE DIAGNOSES:  1.  End-stage traumatic osteoarthritis, left knee status post left total      knee arthroplasty.  2.  Acute blood loss anemia secondary to surgery.  3.  Mild hypokalemia.  4.  Hypertension.  5.  Hyperlipidemia.  6.  Sleep apnea.   SURGICAL PROCEDURE:  On August 14, 2004, Tracy Walker underwent a left total  knee arthroplasty by Dr. Rinaldo Ratel assisted by Arlyn Leak, P.A.-C.  She had an LCS complete primary femoral component cemented plasty on her  left with an LCS complete metal back patella cemented size standard.  A PFC  Sigma modular knee system cemented stem 13 x 30 mm with an MBT revision  cemented tray size 2.5.  An Osteonix size cement plug size 3.  Diameter 14  mm and an LCS complete RM insert size standard 10 mm thickness.   COMPLICATIONS:  None.   CONSULTATIONS:  1.  Case management, physical therapy and rehabilitation medicine consult      August 15, 2004.  2.  Occupational therapy consult August 16, 2004.   HISTORY OF PRESENT ILLNESS:  This 77 year old white female patient presented  to Dr. Chaney Malling with a history of left knee pain since 2004.  She had a  left proximal tibia fracture in 2004 and underwent open reduction internal  fixation of that fracture by Dr. Thurston Hole.  She then had knee scope with  hardware removal and has had left knee pain since that time.  It is now a  stabbing sensation that  goes all the way down her leg.  It awakens her at  night.  She complains of catching and giving way.  She has failed  conservative treatment and x-rays show arthritic changes.  Because of this,  she is presenting for a left knee replacement.   HOSPITAL COURSE:  Tracy Walker tolerated her surgical procedure well without  immediate postoperative complications.  She was subsequently transferred to  5000.  On postoperative day #1, temperature maximum was 99.2, vital stable.  Hemoglobin 11, hematocrit 31.2.  She was started on therapy per protocol and  continued on current pain medications.   On postoperative day #2, again, temperature maximum 99.2, hemoglobin 9.9,  hematocrit 28.6.  Potassium was low at 3.0 and that was supplemented.  She  was switched to p.o. pain medications and continued on therapy.   She made good progress over the next day or so.  It was felt she was ready  for transfer to rehabilitation on  August 17, 2004 and she was able to  transferred to the rehabilitation unit at that time.   DISCHARGE INSTRUCTIONS:  Diet:  She can continue her current hospitalization  diet with adjustments to be made per the rehabilitation physicians.   Medications:  Continue her current hospitalization medications with  adjustments to be made per the rehabilitation physicians.   Activity:  She can be out of bed, partial weightbearing 50% on the left leg  with the use of a walker.  She is to continue physical therapy and  occupational therapy per rehabilitation protocols.  Continue CPM.   Wound care:  Please clean the left knee incision with Betadine daily and  apply a dry dressing.  Please notify Dr. Chaney Malling of temperature greater  than 101.5, chills, pain unrelieved by pain medications or foul-smelling  drainage from the wound.   Follow up:  She needs to follow up with Dr. Chaney Malling in our office about  postoperative day #14 if her staples are still in place after discharge from   rehabilitation.  If they have been discontinued while in rehabilitation,  then she needs follow up with him about a week or two after her discharge.  Staples could be removed with Steri-Strips, Benzoin applied on postoperative  day #14.   LABORATORY DATA:  On August 09, 2004, hemoglobin 13.3, hematocrit 39.  It  went to 11 and 31.2 on August 15, 2004 and then on August 17, 2004, white  count 6.4, hemoglobin 9, hematocrit 26 and platelets 227,000.   On August 09, 2004, glucose was 101.  It went to 125 on August 15, 2004,  130 on August 16, 2004 and 115 on August 17, 2004.  Potassium dropped to a  low of 3.2 on August 16, 2004 and on August 17, 2004 it was 3.5.  Calcium  went from 9.5 on August 09, 2004 to a low of 8 on August 16, 2004 to 8.4  on August 17, 2004.  All other laboratory studies were within normal  limits.  Urine culture done on August 09, 2004 showed 100,000 colonies per  mL of multiple species and it was felt that was a contaminant.       KED/MEDQ  D:  10/25/2004  T:  10/25/2004  Job:  [id number]   cc:   Delaney Meigs, M.D.  [address]  [place]  [place] [address]  Fax: [phone]

## 2011-03-15 NOTE — Discharge Summary (Signed)
NAME:  Tracy Walker, Tracy Walker NO.:  [id number]   MEDICAL RECORD NO.:  [id number]          PATIENT TYPE:  INP   LOCATION:  1537                         FACILITY:  [facility]   PHYSICIAN:  Ardeth Sportsman, MD     DATE OF BIRTH:  01-10-1928   DATE OF ADMISSION:  02/06/2009  DATE OF DISCHARGE:  02/10/2009                               DISCHARGE SUMMARY   PRIMARY CARE PHYSICIAN:  Dr. Joette Catching.   OTHER SURGEON:  Dr. Elmyra Ricks.   Also followed by Dr. Lia Hopping.   PRINCIPAL DIAGNOSIS:  Parastomal hernia and loop colostomy status post  surgeries and debridements for complicated perforated diverticulitis.   PROCEDURES PERFORMED:  Open colostomy takedown and open former  parastomal, now ventral hernia repair on February 06, 2009.   SUMMARY OF HOSPITAL COURSE:  Mr. Scallan is a 77 year old morbid obese  female with numerous health issues who had a bad perforated  diverticulitis that required numerous surgeries to help her survive  through and develop a complicated ventral hernia midline as well as a  parastomal hernia in right upper quadrant.  She has since recovered.  She was being taken care of Dr. Cleotis Nipper, but has transferred care over  to [facility].  Initially my partner Dr. Baruch Merl was  planning to do colostomy takedown.  The first surgery date had to be  cancelled secondary to rash and skin problems, but that has resolved.  Dr. Colin Benton was out on medical leave, and therefore I was asked to assume  the case.   I took the patient to the operating room, and was able to take down the  colostomy.  Postoperatively she was placed on a postop ileus prevention  program.  She ambulated.  She was advanced on her diet.  By the time of  discharge she was walking the hallways, and pain was controlled with  pain pills.  Drainage from her colostomy wound was decreased marginally,  and based on __________ be discharged home on the following  instructions:   1.  She is to resume her home medications which include:      a.     Omeprazole 20 mg daily.      b.     Hydrochlorothiazide 25 mg daily.      c.     Clonidine 0.2 mg daily.      d.     Furosemide 20 mg b.i.d.      e.     Iron sulfate 325 b.i.d.      f.     Vitamin D 1000 international units daily.      g.     Calcium 600 plus vitamin D daily.      h.     Multivitamin daily.   She should also go home on Percocet 5/325 one to two p.o. q.4 hours  p.r.n. pain, Tylenol p.r.n. mild pain, Aleve p.r.n. mild moderate pain,  ice pack p.r.n. pain, warm showers, and/or heating pads p.r.n. pain.   She should call if she has worsening fevers, chills, sweats, nausea,  vomiting, drainage from her  incision, bleeding, lightheadedness,  dizziness or other concerns.      Ardeth Sportsman, MD  Electronically Signed     SCG/MEDQ  D:  03/07/2009  T:  03/07/2009  Job:  [id number]   cc:   Delaney Meigs, M.D.  Fax: [phone]   Kathaleen Maser. Cleotis Nipper, M.D.  Fax: [phone]   Lia Hopping  Fax: [phone]   Alfonse Ras, MD  [phone]  [place]  [place] [address]

## 2011-03-15 NOTE — Discharge Summary (Signed)
[place]  Patient:    Tracy Walker, TETERS Visit Number: [id number] MRN: [id number]          Service Type: SUR Location: 3000 [id number] Attending Physician:  Danella Penton Dictated by:   Tanya Nones. Jeral Fruit, M.D. Admit Date:  09/18/2001 Discharge Date: 09/22/2001                             Discharge Summary  ADMISSION DIAGNOSES: 1. Right L3-4 herniated disk. 2. L4-5 stenosis.  FINAL DIAGNOSES: 1. Right L3-4 herniated disk. 2. L4-5 stenosis.  HISTORY OF PRESENT ILLNESS:  The patient was admitted because of right leg pain.  It was found that she had a herniated disk at the level of 3-4, and stenosis of the left 4-5.  Surgery was advised.  LABORATORY DATA:  Normal.  HOSPITAL COURSE:  The patient was taken to surgery, and the right L3-4 hemilaminectomy with 3-4 diskectomy and foraminotomy was done.  Today, the patient is doing much better.  She has been ambulating, and she is ready to go home.  CONDITION ON DISCHARGE:  Improved.  DISCHARGE MEDICATIONS: 1. Vicodin. 2. Flexeril.  DIET:  Regular.  ACTIVITY:  She is not to drive for at least two weeks, she is not to do any heavy lifting.  FOLLOWUP:  To be seen by me in four weeks. Dictated by:   Tanya Nones. Jeral Fruit, M.D. Attending Physician:  Danella Penton DD:  09/22/01 TD:  09/22/01 Job: [id number]

## 2011-03-15 NOTE — H&P (Signed)
[place]  Patient:    Tracy Walker, DUGUE Visit Number: [id number] MRN: [id number]          Service Type: SUR Location: [facility] 3172 [id number] Attending Physician:  Danella Penton Dictated by:   Tanya Nones. Jeral Fruit, M.D. Admit Date:  09/18/2001                           History and Physical  HISTORY OF PRESENT ILLNESS:  The lady is a 77 year old female who was seen by me in my office a few days ago because of back pain with radiation down the right knee.  This happened [place] the beach.  She went to the local hospital and they ruled out a possibility of DVT.  Later on, she was seen by an orthopedic surgeon who did an MRI and we saw her as an emergency.  She denies any problem with the left leg.  She is quite miserable to the point that she was crying.  PAST MEDICAL HISTORY:  ALLERGIES:  DARVOCET.  MEDICATIONS:  Patient takes some medication for high blood pressure.  SOCIAL HISTORY:  Patient does not smoke, she does not drink.  FAMILY HISTORY:  Unremarkable.  REVIEW OF SYSTEMS:  Is positive for glaucoma.  PHYSICAL EXAMINATION:  GENERAL:  Patient came to my office limping from the right leg.  HEENT:  Normal.  NECK:  Normal.  LUNGS:  Clear.  HEART:  Normal.  ABDOMEN:  Normal.  EXTREMITIES:  Normal pulses.  NEUROLOGIC: Mental status normal.  Cranial nerves normal.  Strength 5/5 except that I can break easily the right quadriceps.  Reflexes symmetrical with the right knee jerk being decreased.  Femoral stretch maneuver is positive bilaterally.  LABORATORY DATA:  The MRI showed that she has a degenerative disk disease at multiple levels but she has a large disk herniation at the level 4-5 going laterally.  There is also the possibility of a small epidural hematoma at the level 3-4 without fracture, compromising the L3 nerve root.  CLINICAL IMPRESSION:  Right L3-L4 herniated disk with radiculopathy and a small epidural  hematoma.  Also, degenerative disk disease with a broad based ligament at the level 4-5.  RECOMMENDATION:  We are taking the patient to surgery.  We are going to do a hemilaminectomy L4 and take a look at the level 3-4 and 4-5.  The risks are infection, CSF leak, worsening of the pain, paralysis, need of further surgery, and the possibility of no improvement whatsoever. Dictated by:   Tanya Nones. Jeral Fruit, M.D. Attending Physician:  Danella Penton DD:  09/18/01 TD:  09/18/01 Job: [id number]

## 2011-03-15 NOTE — Discharge Summary (Signed)
NAME:  Tracy Walker, Tracy Walker                          ACCOUNT NO.:  [id number]   MEDICAL RECORD NO.:  [id number]                   PATIENT TYPE:  INP   LOCATION:  5011                                 FACILITY:  [facility]   PHYSICIAN:  Elana Alm. Thurston Hole, M.D.              DATE OF BIRTH:  January 11, 1928   DATE OF ADMISSION:  11/02/2002  DATE OF DISCHARGE:  11/08/2002                                 DISCHARGE SUMMARY   ADMITTING DIAGNOSES:  1. Left proximal tibial plateau fracture.  2. Hypertension.  3. Sleep apnea.   DISCHARGE DIAGNOSES:  1. Left proximal tibial plateau fracture.  2. Hypertension.  3. Sleep apnea.  4. Lateral meniscus tear.  5. Urinary tract infection.   PROCEDURES IN HOUSE:  On 11/02/02, the patient underwent an open reduction and  internal fixation of her left proximal tibial plateau fracture as well as a  lateral meniscus repair.  She tolerated the procedure well.  She was  admitted postoperatively for pain control and physical therapy.  On  postoperative day #1, the surgical wound was well approximated.  She had a T-  max of 102 degrees Fahrenheit.  Hemoglobin was 10.3.  Surgical wound was  dressed with a moderate amount of drainage.  Her drain was discontinued.  Her PCA was discontinued.  She was placed on Lovenox for DVT prophylaxis.  On postoperative day #2, the patient having a significant amount of pain, no  pain with passive range of motion.  The UA from yesterday  shows positive  bacteria and nitrite positive as well as leukocytes.  She was started on  Tequin.  Rehab consult was done, physical therapy recommending rehab.  She  is also a candidate for SACU.  On postoperative day #3, the patient  improved, still on Lovenox for DVT prophylaxis.  Her hemoglobin is 8.7.  She  is having left leg pain.  No beds were available on rehab or SACU.  On  postoperative day #4, the patient complained of urinary frequency, T-max of  101.1 degrees Fahrenheit.  She was placed on Tequin  400 mg 1 p.o. daily.  Hemoglobin was 9.1.  Surgical wound is well approximated.  Platelets were  290,000.  On postoperative day #5, the patient still doing well, mobilizing  better.  She has no calf pain.  Urinary frequency is improving.  Surgical  wound is well approximated.  On postoperative day #6, the patient complained  of pain last night that was relieved by muscle relaxer and pain pill.  Hemoglobin was 9.3.  T-max was 99.4.  She was switched from Vicodin to  Percocet.  She tolerated this much better.  She was transferred to [facility] in  stable condition, nonweightbearing on her left leg, daily dressing changes,  Vicodin for pain, Lovenox for DVT prophylaxis for one more day, daily  physical therapy.  Will continue to follow her on SACU.     Kirstin Shepperson,  P.A.                  Elana Alm. Thurston Hole, M.D.    KS/MEDQ  D:  12/05/2002  T:  12/05/2002  Job:  [id number]

## 2011-03-15 NOTE — H&P (Signed)
NAME:  Tracy Walker, Tracy Walker NO.:  [id number]   MEDICAL RECORD NO.:  [id number]          PATIENT TYPE:  INP   LOCATION:  4702                         FACILITY:  [facility]   PHYSICIAN:  Hettie Holstein, D.O.    DATE OF BIRTH:  01-Apr-1928   DATE OF ADMISSION:  01/29/2007  DATE OF DISCHARGE:                              HISTORY & PHYSICAL   PRIMARY CARE PHYSICIAN:  Unassigned.  Dr. Lysbeth Galas, [place].   CHIEF COMPLAINT:  Intractable diarrhea.   HISTORY OF PRESENT ILLNESS:  [name] is a very pleasant, 77 year old,  quite independent female with medical history significant for  hypertension and hyperlipidemia as well as a previous history of sleep  apnea who has been dealing with various problems over the past 3 weeks.  This all began with an upper respiratory illness that began  approximately 3 weeks ago at which time Dr. Lysbeth Galas initiated [name]  on Cipro.  She reported positive improvement and near resolution until  approximately this past week, when she then began to develop some loose  stools.  This persisted.  Dr. Lysbeth Galas had sent her home with some stool  cards to check for blood.  One out of three revealed some Hemoccult  positivity.  She reports some very dark stools over the past couple of  days, more so than previously.  She stated that any minimal oral intake  would resultant in copious amounts of diarrhea.  She had the day before  yesterday a bowl of chicken noodle soup, and proceeded to have  repetitive episodes of diarrhea subsequently.  She has had about four to  five episodes daily since that period if time, some reinitiated with  just a small amount of crackers, and she further denies any nausea or  vomiting or any gross blood in her stools.  She has had a hiatal hernia  repair surgery, Nissen fundoplication in 1977.  In any event, she is sent by direct admission from Dr. Joyce Copa office  to [facility].  In any event, she did have some lab work there  on January 14, 2007, that revealed an iron saturation of 7%, a WBC count  at that time was 12.6, hemoglobin was 10.4, and platelet count was 592.  Her MCV was 85.5.  At that time, her BUN was 19.  Her creatinine was  1.32.  Albumin was on the low side at 3.3, and we do not have any  laboratory data at this time from the past 24-48 hours.  She states she has not been eating.  She is hemodynamically stable, but  reported as orthostatic from Dr. Joyce Copa office.   PAST MEDICAL HISTORY:  Is significant for:  1. Hypertension.  2. Hyperlipidemia.  3. Obstructive sleep apnea.  4. Status post left total knee arthroplasty in the past.  5. History of Nissen fundoplication for hiatal hernia, 1977.  6. She is status post cholecystectomy in 1964.  7. Hysterectomy in 1962.  8. She has had some low back surgery.  9. In addition, she has had a colonoscopy within the past 5 years,  which was performed in [place], and reported as negative.   MEDICATIONS:  1. She takes only Diovan/H/CT at 320/25.  2. She takes [employer], mining.  She does not know the dose though it appears on      records sent from her primary care physician's office that this may      be 10 mg, though it is not clearly discernible on records sent.   ALLERGIES:  SHE REPORTS NO KNOWN DRUG ALLERGIES.   SOCIAL HISTORY:  She denies tobacco or alcohol use.  She is widowed for  about 12 years.  She has three children who live locally.  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  She still  continues to drive.   FAMILY HISTORY:  She does not know her father's history.  Her mother  lived into her 44s.   REVIEW OF SYSTEMS:  She reports black stools as noted above.  She states  that this has been going on for quite some time.  She has had about a 5-  pound weight loss over the past couple months.  She reports decreased  energy.  No nausea or vomiting, chest pain, or shortness of breath.  Further review of systems is  unremarkable.   PHYSICAL EXAMINATION:  VITAL SIGNS:  Her blood pressure on arrival to  [facility] revealed her heart rate to be 92, temperature 98,  respirations 20, blood pressure 146/69, and O2 saturation 98% on room  air.  GENERAL:  The patient is awake, alert in no acute distress.  HEENT:  Reveals the head to be normocephalic, atraumatic.  Extraocular  muscles are intact.  NECK:  Supple and nontender.  There is no palpable thyromegaly or mass.  CARDIOVASCULAR:  Reveals normal S1-S2.  LUNGS:  Clear to auscultation bilaterally.  ABDOMEN:  Soft, obese, nontender.  No rebound or guarding.  No palpable  hepatosplenomegaly.  LOWER EXTREMITIES:  Reveal no edema or calf tenderness.   LABORATORY DATA:  Are not resulted yet.   ASSESSMENT:  1. Intractable diarrhea.  2. Black stools with a history of hemoccult positivity per primary      care physician, though in the context of acute probable infectious      diarrhea this hemoccult positivity is not uncommon, however, black      tarry stools are a bit more concerning.  We will check her H&H, as      well as monitor her stools for gross bleeding, check a basic      metabolic panel, review her BUN, and follow her clinical course,      and supplement her iron or transfuse her as indicated.  This may be      a chronic problem as her most recent studies revealed an iron      deficiency anemia.  3. History of hiatal hernia  4. Hypertension.  5. Obesity  6. Sleep apnea.   PLAN:  At this time, we will monitor [name] for active bleeding.  Her  stools are hemoccult positive.  We will check further stool studies in  addition to monitoring her H&H.  Transfuse if indicated.  We will  replace her iron.  She denies recent nonsteroidal anti-inflammatory  usage, though she has listed Aleve on a p.r.n. basis.  She has had  recent exposure to antibiotics, perhaps this is antibiotic-associated diarrhea.  She will be empirically initiated on Flagyl and  Cipro.  We  will follow her clinical course and administer  supportive and  symptomatic management for now.      Hettie Holstein, D.O.  Electronically Signed     ESS/MEDQ  D:  01/29/2007  T:  01/29/2007  Job:  [id number]   cc:   Delaney Meigs, M.D.

## 2011-03-15 NOTE — Op Note (Signed)
NAME:  Tracy Walker, Tracy Walker NO.:  [id number]   MEDICAL RECORD NO.:  [id number]          PATIENT TYPE:  INP   LOCATION:  2550                         FACILITY:  [facility]   PHYSICIAN:  Lenard Galloway. Mortenson, M.D.DATE OF BIRTH:  [birth year]   DATE OF PROCEDURE:  08/14/2004  DATE OF DISCHARGE:                                 OPERATIVE REPORT   PREOPERATIVE DIAGNOSIS:  Lateral tibial plateau fracture with severe  traumatic osteoarthritis left knee.   POSTOPERATIVE DIAGNOSIS:  Lateral tibial plateau fracture with severe  traumatic osteoarthritis left knee.   OPERATION PERFORMED:  Total knee replacement complex with revision tibial  components using the following components.  A standard left cemented femoral  component with a revision cemented 2.5 MBT tibial tray with a PFC modular  knee system with cemented stem 13 x 30 mm, size 3 cement plug, 10 mm  standard size poly insert and a standard size, cemented rotating patella.   SURGEON:  Lenard Galloway. Chaney Malling, M.D.   ASSISTANT:  Jamelle Rushing, P.A.   ANESTHESIA:  General.   DESCRIPTION OF PROCEDURE:  The patient was placed on the operating table in  the supine position with a pneumatic tourniquet about the left upper thigh.  The entire left lower extremity was prepped with DuraPrep and draped out in  the usual manner.  Vi-drape was placed to the operative site.  The left  lower extremity was wrapped out with an Esmarch and tourniquet was elevated.  Incision was made starting to about the level of the tibial tubercle and  carried proximally above the patella.  Skin edges were retracted.  A long  median parapatellar incision was made to the superior medial border of the  patella and the fibers of the VMO were then split obliquely.  The patella  was then everted.  Good access to the knee joint was obtained.  There was  severe degenerative traumatic change in the tibial plateau, especially  laterally.  Both medial and lateral  meniscus were excised and the cruciate  ligaments were removed.  Drill hole was placed in the tibial spine and the  proximal tibia was reamed up with progressively larger size hand reamers.  The intramedullary rod was inserted and the cutting block was inserted on  the intramedullary rod.  This was brought down to the appropriate level of  cutting on the tibia.  This was locked into position with fixation pins and  the proximal tibia was amputated.  A good cut was achieved.  The remainder  of menisci were then excised.  There was a defect laterally full of  fibrocartilage.  This was carefully curetted out with a large curette.  Attention was then turned to the distal femur.  Femoral guide #1 was placed  over the proximal end of the femur and a drill hole was placed and  intramedullary rod inserted.  With the C-clamp inserted, the knee flexed 90  degrees, the rotation of the femoral component was set up properly and then  locked into position with fixation pins.  Using a capsular guide, anterior  and posterior femoral condylar  cuts were made.  Femoral guide #2 was then  placed over the intramedullary rod at 4 degrees of valgus and locked on the  anterior aspect of the femur.  The distal femur was then amputated using the  guide.  In flexion and extension, a 10 mm spacer block balanced medial and  collateral ligaments very nicely both in flexion and extension.  The final  femoral guide was placed over the distal end of the femur and locked into  position.  Chamfer cuts were made and drill holes were placed.  All bony  debris was then removed.   Attention then turned to the tibia which was subluxed anteriorly.  The 2.5  mm trial was held in position and locked in position.  The tower was  applied.  The drill was used to drill the proximal tibia but there was a  great of sclerotic bone.  An osteotome was used to remove the sclerotic bone  so that the drill would not be diverted from the  appropriate position.  A  great deal of time was spent preparing the proximal tibia.  Finally, the  large drill guides were passed down the tibial canal.  This was opened up.  The 2.5 mm trial was placed.  An approximately 10 mm tower had been removed  and the winged keel was then inserted and driven in place.  This held the  tibial component in excellent position.  This was then removed and we used  the extended tibial tray.  A 10 mm poly was inserted and the femoral  component was put on the distal end of the femur.  The knee was put through  a full range of motion and this seated very nicely.  Attention was turned to  the posterior aspect of the patella.  The cutting guide was put in place.  The posterior aspect of the patella was amputated and drill holes were  placed in the posterior aspect of the patella in a standard fashion.  The  revision tray was then put together.  A bony plug was placed in the tibia.  The tibia had been overdrilled so that there would be a large glue mantle.  Pulsating lavage was used.  All debris was removed.  Glue was mixed.  The  modular PFC revision cemented tray was articulated and glue was driven down  the tibial canal and placed in the posterior aspect of the tibial tray.  The  component was then driven into place and excess glue was removed.  Glue was  placed over the distal end of the femur and the posterior runners of the  femoral component and the femoral component was then driven home.  A trial  poly was inserted and the knee was articulated.  Excess glue was removed  with a curette in standard fashion before it hardened.  Glue was placed on  the posterior aspect of the patella and the patella was articulated and held  in place with the clamp.  Once the glue had cured, the knee was put through  a full range of motion.  There was full flexion, full extension.  Excellent balancing of ligaments both in flexion and extension.  Excess glue was  removed  with an osteotome.  Pulsating lavage was inserted again and all  debris was removed.  A trial poly was removed.  Tourniquet was dropped and  bleeders were coagulated.  Hemovac drain was then put in placed.  The final  revision poly was  then inserted.  It is an RP insert size standard 10 mm and  knee was put through a full range of motion again.  Excellent stability in  flexion and extension to varus and valgus stress.  A slight lateral release  was done.  The knee was then closed with interrupted heavy Ethibond.  Vicryl  was used to close subcutaneous tissue and stainless steel staples used to  close the skin.  Technically this procedure went extremely well.  I was very  pleased with the final surgical outcome.   DRAINS:  Hemovac.   COMPLICATIONS:  None.       RAM/MEDQ  D:  08/14/2004  T:  08/14/2004  Job:  [id number]

## 2011-03-15 NOTE — Op Note (Signed)
[place]  Patient:    ADALENE, GULOTTA                       MRN: [id number] Proc. Date: 05/14/00 Adm. Date:  [id number] Disc. Date: [id number] Attending:  Tommy Medal                           Operative Report  INDICATIONS AND JUSTIFICATIONS FOR PROCEDURE:  This lady has been followed in my office since November of 2000 and has had severe blepharoptosis.  When seen on April 09, 2000, she was very bothered by the droopy skin of her upper eyelids and this caused her to have reduced vision when she was reading.  She said it was like a shadow that blocked her vision.  Photographs of the visual fields were done to prove this.  Pressures were 17 on the right and 19 on the left.  The pupils ______ lids.  Conjunctivae, cornea, anterior chamber, and fundus exams were normal except for the droopy skin of her upper eyelids. Medically she is stable with respect to having upper eyelid blepharoplasties. She wanted to have upper eyelid blepharoplasties in order to improve her vision.  Justification for performing the procedure in an outpatient setting is routine.  Justification for a one-day stay is none.  PREOPERATIVE DIAGNOSIS:  Severe blepharoptosis with visual impairment.  POSTOPERATIVE DIAGNOSIS:  Severe blepharoptosis with visual impairment.  OPERATION PERFORMED:  Upper eyelid blepharoplasties.  SURGEON:  Robert L. Dione Booze , M.D.  ANESTHESIA:  Xylocaine.  DESCRIPTION OF PROCEDURE:  The patient arrived in the minor surgery room, and was prepped and draped in the routine fashion.  The skin to be removed was carefully demarcated and a frontal nerve block of Xylocaine was given.  The skin was then excised as was some underlying fatty tissue and the wounds were closed with running 6-0 nylon sutures.  Pressure patches were applied and the patient left the operating room having done nicely.  FOLLOW-UP CARE:  The patient to remove the patch later  this afternoon and to be seen in my office in five days to remove the sutures. DD:  05/14/00 TD:  05/16/00 Job: [id number]

## 2011-03-15 NOTE — H&P (Signed)
NAME:  Tracy, Walker NO.:  [id number]   MEDICAL RECORD NO.:  [id number]          PATIENT TYPE:  INP   LOCATION:  NA                           FACILITY:  [facility]   PHYSICIAN:  Tracy Walker, M.D.DATE OF BIRTH:  [birth year]   DATE OF ADMISSION:  DATE OF DISCHARGE:                                HISTORY & PHYSICAL   CHIEF COMPLAINT:  Left knee pain.   HISTORY OF PRESENT ILLNESS:  Tracy Walker is a 77 year old white female with  left knee pain since 2004.  The patient sustained a left proximal tibia  fracture in 2004 and subsequently underwent ORIF of the fracture by Dr.  Elana Alm. Walker.  She then underwent a left knee scope and a hardware  removal.  She has had left knee pain every since the injury.  The knee pain  is described as a stabbing pain that radiates into her toes.  The pain  increases with ambulation.  The pain does awaken her at night.  Mechanical  symptoms are positive for catching and giving way.   She has underwent injections in the left knee that helped approximately 50%.  She uses a cane to ambulate at times.  X-rays of the left knee show  sclerotic changes of the lateral tibial plateau with bone on bone in the  lateral compartment.  There is medial compartment narrowing, approximate 10  degree of valgus deformity.  On the lateral view, there is some degenerative  changes of the articular surface of the patella.   The patient is to undergo left total knee arthroplasty by Dr. Lenard Walker.  Walker at [facility] on August 14, 2004.   ALLERGIES:  No known drug allergies.   MEDICATIONS:  1.  Diovan 5 mg 1 q.d.  2.  Sular 10 mg 1 q.d.   PAST MEDICAL HISTORY:  1.  Hypertension.  2.  Hyperlipidemia.  3.  Sleep apnea.   PAST SURGICAL HISTORY:  1.  Cholecystectomy in 1964.  2.  Hiatal hernia repair in 1977.  3.  Hysterectomy in 1962.  4.  Lower back surgery.  5.  Surgery for cataracts both eyes.  6.  ORIF of the left  knee with subsequent left knee arthroscopy and hardware      removal by Dr. Elana Alm. Walker.  The patient denies any complications      with any above procedures and did not require any blood transfusions.   SOCIAL HISTORY:  The patient denies any tobacco or alcohol use.  She is a  widow.  Has three grown children.  The patient lives in a one-story home  with two steps to usual entry.  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   PRIMARY CARE PHYSICIAN:  Tracy Walker, M.D. who cleared her for her  upcoming surgery.   FAMILY HISTORY:  Mother deceased at unknown age due to pneumonia.  Father  deceased unknown age of lung disease.  The patient has no brothers or  sisters.   REVIEW OF SYMPTOMS:  The patient denies any chest pain, shortness of  breath,  PND, or orthopnea.  She wears glasses.  GI/GU:  Reviewed and are negative.  The remainder of the review of systems is negative.  Also reviewed with the  patient the fact that she states she does not have hypothyroidism and her  dyslipidemia is under good control.   PHYSICAL EXAMINATION:  GENERAL:  The patient is a well-developed, well-  nourished female in no acute distress.  She has a valgus deformity of the  left knee.  The patient's affect is appropriate and talks easily with  examiner.  VITAL SIGNS:  Height is 5 foot 5 inches.  Weight is 165 pounds.  Blood  pressure is 150/80, pulse 82, respiratory rate 14.  Temperature is 98.4  CARDIOVASCULAR:  Regular rate and rhythm.  No murmurs, rubs, or gallops  noted.  LUNGS:  Clear to auscultation bilaterally.  No wheezing, rhonchi, or rales  noted.  ABDOMEN:  Soft, nontender.  Bowel sounds x 4 quadrants.  BREASTS:  Deferred at this time.  RECTAL:  Deferred at this time.  GENITOURINARY:  Deferred at this time.  HEENT:  Head is normocephalic and atraumatic without frontal maxillary or  sinus tenderness to palpation.  Conjunctivae are pink.  Sclerae is  nonicteric.   PERRLA.  EOMs intact.  No visible ear deformities are noted.  TMs are PERRLA and gray bilaterally.  Nose shows nasal septum is midline.  Nasal mucosa is pink and moist without polyps.  Buccal mucosal is pink and  moist.  Good dentition.  Pharynx is without erythema or exudate.  Tongue and  uvula are midline.  NECK:  Trachea is midline.  Full range of motion without pain.  Carotids are  2+ without bruits.  BACK:  Thoracic and lumbar column without pain with palpation.  NEUROLOGICAL:  The patient is alert and oriented x 3.  Cranial nerves II  through XII grossly intact.  Upper and lower extremity strength testing  reveals 5/5 strength throughout.  Deep tendon reflexes are 2+ in the upper  and lower extremities.  MUSCULOSKELETAL:  Upper extremities have full range of motion of the  shoulders at the elbows, wrists, and hands.  Upper extremities are equal and  symmetric in size and shape.  Radial pulses are 2+ bilaterally.  Lower  extremities are equal in size and shape.  Bilateral hips have full range of  motion without pain.  Right knee 0 to 125 degrees of flexion.  Positive  crepitus with passive range of motion.  No effusion and no edema.  Valgus  stressing reveals no laxity.  Anterior draw is negative.  Left knee has  hyperextension 2 to 3 degrees, 110 degrees of flexion, 10 degrees valgus  deformity appearance with palpation of the lateral compartment.  Moderate  amount of crepitus with passive range of motion.  Anterior draw is negative.  There is no effusion and no edema.  Lower extremities are not edematous.  Dorsal pedal pulses are 2+ bilaterally.   IMPRESSION:  1.  Left knee with 10 degree valgus deformity, bone-on-bone lateral      compartment.  2.  Hypertension.  3.  Sleep apnea.  4.  Hyperlipidemia.   PLAN:  The patient is to be admitted to [facility] on August 14, 2004 and to undergo a left total knee arthroplasty by Dr. Lenard Walker. Walker.  The  patient will undergo all preoperative testing prior to  surgery.  The patient does request a rehabilitation consultation if unable  to go  to rehabilitation with request to be placed in a skilled nursing  facility due to the fact that she lives alone.       GC/MEDQ  D:  08/06/2004  T:  08/06/2004  Job:  [id number]

## 2011-03-15 NOTE — Op Note (Signed)
NAME:  Tracy Walker, Tracy Walker                        ACCOUNT NO.:  [id number]   MEDICAL RECORD NO.:  [id number]                   PATIENT TYPE:  AMB   LOCATION:  DSC                                  FACILITY:  [facility]   PHYSICIAN:  Robert A. Thurston Hole, M.D.              DATE OF BIRTH:  05-03-1928   DATE OF PROCEDURE:  07/11/2003  DATE OF DISCHARGE:                                 OPERATIVE REPORT   PREOPERATIVE DIAGNOSES:  1. Status post open reduction and internal fixation of left tibial plateau     fracture with retained painful hardware.  2. 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   POSTOPERATIVE DIAGNOSES:  1. Status post open reduction and internal fixation of left tibial plateau     fracture with retained painful hardware.  2. 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   PROCEDURES:  1. Left knee examination under anesthesia followed by arthroscopic     chondroplasty, removal of loose bodies, and debridement.  2. Left tibial plateau removal of retained hardware.   SURGEON:  Elana Alm. Thurston Hole, M.D.   ASSISTANT:  Julien Girt, P.A.   ANESTHESIA:  General.   OPERATIVE TIME:  One hour.   COMPLICATIONS:  None.   INDICATION FOR PROCEDURE:  Tracy Walker is a 77 year old woman who has had  significant painful retained hardware in her left knee secondary to her ORIF  from her tibial plateau fracture and post-traumatic DJD and loose bodies.  She is now to undergo arthroscopy with loose body removal, as well as  removal of the retained hardware.  A CT scan preoperatively showed a  possible area of nonhealing on the medial tibial plateau, but she did have  healing on her lateral tibial plateau.   DESCRIPTION OF PROCEDURE:  Tracy Walker was brought to the operating room on  July 11, 2003, and placed on the operating table in the supine  position.  After  an adequate level of general anesthesia was obtained, her  left knee was examined under anesthesia.  Range of motion from -5 to 130  degrees.  Knee stable ligamentous exam.  The knee was sterilely injected  with 0.25% Marcaine with epinephrine.  The left leg was prepped using  sterile Duraprep and draped using sterile technique.  She received Ancef 1 g  IV preoperatively for prophylaxis.  Initially the arthroscopy was performed  through an anterolateral portal.  The arthroscope with a pump attachment was  placed and through an anteromedial portal the arthroscopic probe was placed.  On initial inspection of the medial compartment, she had a 50% grade 3  chondromalacia which was thoroughly debrided.  The medial meniscus was  intact.  Intracondylar notch inspected.  The anterior and posterior cruciate  ligaments were normal.  The lateral compartment showed a depressed grade 3  chondral deficiency of the lateral tibial plateau and this was thoroughly  debrided.  There was one small area of exposed screw, but this was recessed  and did not articulate with the femoral condyle.  The entire lateral tibial  plateau showed grade 3 and mild grade 4 changes.  The lateral meniscus was  intact.  The lateral femoral condyle showed 50-60% grade 3 chondromalacia as  well and this was debrided.  The patellofemoral joint showed grade 4  chondromalacia over 25% of the patella, the rest grade 3 changes, and this  was debrided.  The femoral groove showed 50% grade chondromalacia and this  was debrided.  The patella tracked normally.  Moderate synovitis in the  medial and lateral gutters were debrided, otherwise they were free of  pathology.  After this was done, the arthroscope was removed.  The leg was  exsanguinated and a tourniquet elevated to 350 mm.  Through her previous  anterior incision, a new 15 cm incision was made.  This was done for  exposure to the proximal tibia.  The underlying subcutaneous tissues  were  incised in line with the skin incision.  The anterior tibial musculature was  subperiosteally dissected off of the underlying proximal tibia and the  tibial plate, which was exposed.  All of the screws were removed and then  the plate was removed without complications.  The proximal tibial plateau  did appear to be satisfactorily healed.  Intraoperative fluoroscopy  confirmed removal of all of the hardware in satisfactory position, although  a significant malunion over the lateral tibial plateau.  At this point, the  wound was copiously irrigated.  Hemostasis was obtained with cautery after  the tourniquet was released and then the wound closed with 0 Vicryl to close  the anterior tibialis fascia partially back over the anterior tibia.  Subcutaneous tissues closed with 0 and 2-0 Vicryl.  Skin closed with skin  staples.  Sterile dressings were applied.  The patient was then awaken and  taken to the recovery room in stable condition.  The needle and sponge  counts were correct at the end of the case.   FOLLOWUP CARE:  Tracy Walker will be followed overnight at the recovery care  center for IV pain control and neurovascular monitoring.  She will be seen  back in the office in a week for sutures out and followup.                                               Robert A. Thurston Hole, M.D.    RAW/MEDQ  D:  07/11/2003  T:  07/11/2003  Job:  [id number]

## 2011-03-15 NOTE — Consult Note (Signed)
NAME:  Tracy Walker, Tracy Walker NO.:  [id number]   MEDICAL RECORD NO.:  [id number]          PATIENT TYPE:  INP   LOCATION:  4702                         FACILITY:  [facility]   PHYSICIAN:  Dyke Maes, M.D.DATE OF BIRTH:  07/19/1928   DATE OF CONSULTATION:  01/30/2007  DATE OF DISCHARGE:                                 CONSULTATION   REASON FOR CONSULTATION:  Acute renal failure.   HISTORY OF PRESENT ILLNESS:  A 77 year old white female who was admitted  on January 29, 2007 for diarrhea, volume depletion,  and acute renal  failure.  She has no known renal history and serum creatinine was 1.3 on  January 14, 2007.  On admission serum creatinine was 8.1 which is  decreased slightly to 7.8 as of today.  Her potassium which was 6.2 is  decreased to 5.4 with medical therapy.  Urine output has been 960 cc  since midnight.  She has recently finished a course of Cipro last week  for upper respiratory infection.  Although she says she has had mild  diarrhea for the last three weeks, it dramatically worsened in the past  week.  She has had poor p.o. intake.  She has been on  Diovan/hydrochlorothiazide as an outpatient.  Renal ultrasound done this  admission shows a right kidney of 12.1 cm, left kidney 12.2 cm with no  hydronephrosis though there was mild caliectasis on the left.   PAST MEDICAL HISTORY:  1. Significant for hypertension x at least 10 years.  2. Hyperlipidemia.  3. Obstructive sleep apnea.  4. Status post back surgery.  5. Status post left knee arthroscopy, status post cholecystectomy,      status post hysterectomy.  She had reported a colonoscopy within      the five years which was unremarkable.   ALLERGIES:  None.   CURRENT MEDICATIONS:  1. Norvasc 5 mg a day.  2. Flagyl 500 mg q.8 h.  3. Ferrous sulfate 325 mg b.i.d.  4. Questran 2 g q. 12.   As noted her medicines prior to admission though were Diovan,  hydrochlorothiazide unknown dose one a day, Sular  20 mg a day.   SOCIAL HISTORY:  She is nonsmoker, nondrinker.  She is widowed.  She is  retired Engineer, civil (consulting).   FAMILY HISTORY:  Father's history is unknown.  Mother died in her 47s.  There is no family history of renal disease.   REVIEW OF SYSTEMS:  Appetite has improved over the past 24 hours, and  she is eating lunch as I spoke to her.  Energy level has been poor  especially over the last week.  She has no shortness of breath, no chest  pains, no new arthritic complaints.  No new skin rashes.  No dysuria.  She has had 3-4 bowel movements per day, though last week she was having  too numerous to count.  She denies any abdominal pain.   PHYSICAL EXAMINATION:  VITAL SIGNS:  Blood pressure 154/78, temperature  99.1, pulse 88.  GENERAL:  A 77 year old white female in no acute distress.  HEENT:  Sclera nonicteric, muscles  are intact.  Conjunctivae are pale.  NECK:  Reveals no JVD.  LUNGS:  Clear to auscultation.  Regular rate and rhythm without murmur,  rub or gallop.  ABDOMEN:  Positive bowel sounds, nontender,  nondistended.  No hepatosplenomegaly.  She does have a lipoma on the  right side of her abdominal wall.  EXTREMITIES:  No clubbing, cyanosis or edema.  Pulses 2/4 and equal  bilaterally.  NEUROLOGIC:  Cranial nerves intact.  Motor and sensory  intact.  No asterixis.   LABORATORY DATA:  Sodium 134, potassium 5.4, chloride 106, BUN 66,  bicarb 19, creatinine 7.8.  Calcium 8.3.  Albumin 2.4.  Hemoglobin 8.7,  white count 15.7.   IMPRESSION:  1. Acute renal failure secondary to volume depletion in the face of an      ARB.  2. Diarrhea possibly Clostridium difficile versus viral enteritis that      is going around.  3. Anemia and iron deficiency.  4. Hypertension.   RECOMMENDATIONS:  1. I agree with holding the Diovan for now.  I wonder about the      continuation of Diovan when she is at risk for volume depletion in      the future.  I think it is a good medicine for her to be  on and she      can certainly resume it when this incident is over, but in the      future she should hold it if she is at risk of dehydration.  Agree      with IV hydration as you are doing.  2. Might consider giving her IV iron.  3. Will follow her renal function daily.  Place her on low potassium      diet.  If her bicarbonate decreases further, then will replace.  4. Will check urinalysis.   Thanks very much for consult.  Will follow the patient with you.           ______________________________  Dyke Maes, M.D.     MTM/MEDQ  D:  01/30/2007  T:  01/30/2007  Job:  [id number]   cc:   Hettie Holstein, D.O.

## 2011-03-15 NOTE — Discharge Summary (Signed)
NAME:  Tracy Walker, Tracy Walker NO.:  [id number]   MEDICAL RECORD NO.:  [id number]          PATIENT TYPE:  INP   LOCATION:  4702                         FACILITY:  [facility]   PHYSICIAN:  Madaline Savage, MD        DATE OF BIRTH:  19-Dec-1927   DATE OF ADMISSION:  01/29/2007  DATE OF DISCHARGE:  02/11/2007                               DISCHARGE SUMMARY   Primary care physician is Dr. Lysbeth Galas, in [place].  This patient is  unassigned to [place].   Consultations in the hospital.  1. Dr. Jeani Hawking saw the patient from gastroenterology.  2. Dr. Briant Cedar saw the patient from nephrology.   DISCHARGE DIAGNOSES:  1. Acute renal failure secondary to volume depletion.  2. Nausea, vomiting and diarrhea, which is resolved.  3. Heme positive stools.  4. Iron deficiency anemia.  5. Hypertension.  6. Deconditioning.   HISTORY OF PRESENT ILLNESS:  For the full history and physical see the  history and physical dictated by Dr. Angelena Sole on January 29, 2007.  In short,  Ms. Tracy Walker is a 77 year old lady who was admitted on January 29, 2007 with  complaints of intractable diarrhea.  She was apparently started on  ciprofloxacin as an outpatient for an upper respiratory illness and she  was improving on it.  She had 1 out of 3 Hemoccults done as an  outpatient, which came back positive and she also complained of dark  colored stools.  She was admitted as a direct admission from Dr.  Joyce Copa office and, at that time, she was noted to be in acute renal  failure with creatinine of 8.1.   PROCEDURES DONE IN THE HOSPITAL:  1. She had renal ultrasound done on January 30, 2007, which showed normal      kidneys without focal renal abnormality.  No gross hydronephrosis.  2. She had an abdomen AP view x-ray done on January 31, 2007, which      showed no acute abdominal abnormality.  3. She had an EGD done by Dr. Jeani Hawking on February 03, 2007, which      showed inflammation at the pylorus and he recommended to  continue      PPI.  4. She had a colonoscopy done on February 06, 2007, which showed      diverticula throughout the colon, large hemorrhoids and he      recommended to do a capsule endoscopy as an outpatient.   PROBLEM LIST:  1. Nausea, vomiting, diarrhea.  [name] was admitted with the      nausea, vomiting and diarrhea to the hospital and we consulted      gastroenterology.  She had an EGD and a colonoscopy, which did not      reveal the source of her symptoms.  Her diarrhea has completely      resolved, her nausea has improved and, at this time,      gastroenterology does not want to do any further workup as an      inpatient.  They want to do a capsule endoscopy as an outpatient  and Dr. Elnoria Howard will see her in his office as an outpatient.  2. Acute renal failure.  She was in acute renal failure with a      creatinine of 8 on admission.  We consulted nephrology.  She was      also on Diovan as an outpatient, which has exacerbated her      problems.  We can restart Diovan once her kidney function is back      to her normal.  Her baseline creatinine was approximately 1.3 in      January, 2008.  At this time, her creatinine has stabilized at      around 1.64, which may be her new baseline.  3. Heme positive stools.  She had workup as an inpatient, including      EGD and colonoscopy, which was negative.  She will have a capsule      endoscopy as an outpatient.  4. Hypertension.  Her blood pressure has been slightly above normal,      for which her medications can be titrated up.  5. Deconditioning.  She was evaluated by physical therapy who      recommended skilled nursing facility for physical therapy.   DISCHARGE MEDICATIONS:  1. Protonix 40 mg daily.  2. Ferrous sulfate 325 mg twice daily.  3. Questran 2 g twice daily.  4. Norvasc 10 mg daily.  5. Phenergan 12.5 mg every 6 hours as needed for nausea.  6. Tylenol 650 mg every 6 hours as needed.   DISPOSITION AT THIS TIME:   Her kidney function has stabilized.  We will  stop her IV fluids today and watch her kidney function for another day.  If her renal function remains stable, she can be discharged to a skilled  nursing facility.   FOLLOWUP:  She will follow up with her primary care doctor, Dr. Lysbeth Galas,  in 1-2 weeks.  She is also give the number of Dr. Elnoria Howard, [phone].  She  will call Dr. Elnoria Howard and follow up with him in 1-2 weeks.      Madaline Savage, MD  Electronically Signed     PKN/MEDQ  D:  02/10/2007  T:  02/10/2007  Job:  [id number]   cc:   Jordan Hawks. Elnoria Howard, MD

## 2011-03-15 NOTE — Consult Note (Signed)
NAME:  Tracy Walker, Tracy Walker NO.:  [id number]   MEDICAL RECORD NO.:  [id number]          PATIENT TYPE:  INP   LOCATION:  4702                         FACILITY:  [facility]   PHYSICIAN:  Tracy Hawks. Elnoria Howard, MD    DATE OF BIRTH:  07/12/1928   DATE OF CONSULTATION:  01/30/2007  DATE OF DISCHARGE:                                 CONSULTATION   REASON FOR CONSULTATION:  Heme-positive stool and diarrhea.   HISTORY OF PRESENT ILLNESS:  This is a 77 year old female with a past  medical history of hypertension, hyperlipidemia, obstructive sleep  apnea, status post left knee arthroplasty, status post Nissen  fundoplication in 1977, status post cholecystectomy in 1964, status post  hysterectomy in 1962, and low back surgery, who was admitted to the  hospital with severe diarrhea and with resultant dehydration.  The  patient was seen by her primary care physician, Dr. Lysbeth Walker, with  complaints of weakness.  She was started on ciprofloxacin approximately  4 weeks ago, and she thought she was feeling better after having  complaints of upper respiratory tract infection.  Unfortunately, she  started to develop diarrhea approximately 1 week ago. It was loose, and  she was stated having approximately four bowel movements.  However, she  noted an increase in her diarrhea, and she subsequently called her  primary care Tracy Walker and stated that she was not feeling well. At the  time of presentation, she would was noted to be extremely weak and  subsequently was sent to the hospital for admission.   Upon arrival to the hospital, the patient was noted to be markedly  dehydrated, and her creatinine was elevated to 8.14.  She apparently has  a baseline of approximately 0.8-1.32. The most recent creatinine of 1.32  was performed on January 14, 2007.  During the subsequent workup, she was  also noted to have heme-positive stool and a mild anemia.  She denies  any nausea, vomiting, fevers, chills or any  sick contacts recently. She  is able to tolerate p.o.  After receiving IV hydration, she does report  improvement in her symptoms, and her creatinine has declined slightly  down from 8.14 to 7.22.   PAST MEDICAL AND SURGICAL HISTORY:  Is as stated above.   FAMILY HISTORY:  Noncontributory.   SOCIAL HISTORY:  The patient has three children, is widowed. Negative  for alcohol, tobacco or illicit drug use.   REVIEW OF SYSTEMS:  As stated above in History of Present Illness.  Additionally, there is weight loss.  No new neurologic deficits.  No  skin rashes, no chest pain, shortness of breath.  No hematuria or  hematochezia.   PHYSICAL EXAMINATION:  VITAL SIGNS:  Blood pressure is 152/75, heart  rate 78, temperature 98.5, respirations 20.  GENERAL:  The patient is in no acute distress, alert and oriented.  She  is pale appearing.  HEENT:  Normocephalic, atraumatic.  Extraocular muscles intact.  Pupils  equal, round, reactive to light.  NECK:  Supple.  No lymphadenopathy.  LUNGS:  Clear to auscultation bilaterally.  CARDIOVASCULAR:  Regular rate and rhythm.  ABDOMEN:  Obese, soft, nontender, nondistended.  EXTREMITIES:  No clubbing, cyanosis or edema.   LABORATORY VALUES:  White blood cell count is 12.6, hemoglobin 8.2, MCV  78.9, platelet count 611.  Sodium is 137, potassium 4.8, chloride 109,  CO2 18, glucose 113, BUN 59, creatinine 7.22,  albumin 2.2.   IMPRESSION:  1. Diarrhea.  2. Dehydration secondary to diarrhea.  3. Heme-positive stools.  4. Anemia.   After evaluation of the patient, it is likely that she may have  Clostridium difficile colitis given her recent history of antibiotic  use, and further evaluation will be required.  She is also noted to be  anemic, and her baseline hemoglobin is approximately in the 9 range, and  she is currently noted to be around 8.2.  There is a report of melena,  and she is heme positive. Her last hemoglobin was noted to be at 10.4 on   January 14, 2007.  Additionally, her iron saturation is at 7%.   Because of her profound diarrhea, endoscopic evaluation is not warranted  at this time. Certainly, if the diarrhea continues to persist, a  flexible sigmoidoscopy versus colonoscopy may be performed.   PLAN:  1. Continue with Flagyl.  2. Checked for C. difficile.  I do not see any orders for C. difficile      toxin to be taken.  3. Continue with IV hydration.  4. Will await further evaluation with endoscopy depending on the      patient's clinical status.      Tracy Hawks Elnoria Howard, MD  Electronically Signed     PDH/MEDQ  D:  01/30/2007  T:  01/31/2007  Job:  [id number]   cc:   Tracy Walker, M.D.

## 2011-03-15 NOTE — Discharge Summary (Signed)
NAME:  Tracy Walker, TURBIN NO.:  [id number]   MEDICAL RECORD NO.:  [id number]                   PATIENT TYPE:   LOCATION:                                       FACILITY:   PHYSICIAN:  Ellwood Dense, M.D.                DATE OF BIRTH:  [birth year]   DATE OF ADMISSION:  11/08/2002  DATE OF DISCHARGE:  11/18/2002                                 DISCHARGE SUMMARY   DISCHARGE DIAGNOSES:  1. Left fibular plateau fracture with open reduction and internal fixation .  2. Postoperative anemia.  3. Hypertension.  4. Intermittent nausea.   HISTORY OF PRESENT ILLNESS:  The patient is a 77 year old female with  history of  hypertension and OSA who fell on 10/30/2002, sustaining  communicated left tibial plateau an fibular head fracture.  She was then  transferred to [facility]  11/02/2002 for evaluation and treatment.  The patient underwent ORIF left tibial plateau on 11/02/2002 by Dr. Thurston Hole and  post op was made nonweightbearing on the left lower extremity currently on  Lovenox for DVT prophylaxis.  She has had problems with nausea question  secondary to Tequin.  She also has had some problems with hypoxia and  continues on O2.  Poor pain control is reported.  Physical therapy initiated  and the patient is minimal assist with bed mobility, close supervision  ambulating 50 feet.   PAST MEDICAL HISTORY:  Significant for hypertension, hysterectomy,  cholecystectomy, and hiatal hernia, OSA, femoral hernia, and lumbar  diskectomy.   ALLERGIES:  Darvocet.   SOCIAL HISTORY:  The patient is widowed, lives alone.  She was independent  and very active prior to admission.  She does not use any tobacco or  alcohol.   HOSPITAL COURSE:  The patient was admitted to [facility] on 11/08/2002 for  inpatient therapy consisting of PT and OT daily.  Past admission shows  maintenance subcu Lovenox for DVT prophylaxis.  During this stay there  initially have been problems with pain  control as well as spasms of the back  that were exacerbated secondary to immobility upper and left lower  extremity.  Followup x-rays were done on the left knee on 11/10/2002 showing  near anatomic alignment, post ORIF of severely comminuted proximal tibial  plateau and medial and lateral plateau.  She remains nonweightbearing and is  to followup with Dr. Thurston Hole for advancement of her pain status.   LABORATORY DATA:  Labs done on the last admission showed hemoglobin 9.7,  hematocrit 29.2,  she was noted to have some mild elevation of her liver  function tests with SGPT 42, SGOT 38.   The patient has had problems intermittently with nausea in part secondary to  narcotics.  She has required titration of medications to include OxyContin  CR 30 b.i.d. plus __________ 100 q.i.d. in addition to  p.r.n. oxycodone.  She has been tapered off Ultram by  the time of discharge.  She was taken off  iron to decrease her abdominal discomfort.  Checkup labs on the day of  discharge show improvement with postop anemia  with improvement of 10.3,  hematocrit 30.7.  Check of lytes showed sodium 141, potassium 4.0, chloride  103, CO2 32, BUN 14, creatinine 0.9, glucose 109, SGOT 18, SGPT 16,  bilirubin 0.8, alkaline phosphatase 86.   The patient's knee incision is noted to be healing well without any signs or  symptoms of infection or drainage are noted. The patient is to follow up  with Dr. Thurston Hole in the next week to ten days for postoperative check.  The  patient's blood pressures have reasonable control during her stay.  By the  time of discharge, The patient  had progressed to being modified  independence with transfers, supervision ambulating 70 feet, modified  independence for managing her wheel chair 150 feet.  The patient was  modified independence with upper body care with supervision for lower body  care with assistive device.  She is at supervision for toileting.  She will  continue with followup  home health, PT and OT evaluation for now until  weight bearing status.  On 11/18/2002, the patient is discharged to home.   DISCHARGE MEDICATIONS:  1. OxyContin CR 30 mg b.i.d. times one week, then taper 20 mg b.i.d. times     one week and 10 mg b.i.d. times one week and discontinue.  2. OxyIR 5-10 mg p.o. every four to six hours p.r.n. pain.  3. Coreg 0.625 mg a day  4. Norvasc 5 mg a day  5. Hydrochlorothiazide 12.5 a day  6. Os Cal plus D one p.o. t.i.d.  7. Skelaxin 400 mg two q.i.d.  8. Senna two per day  9. Celebrex 100 mg b.i.d.  10.      Tylenol additionally p.r.n. pain.   ACTIVITY:  No weight on the left lower extremity.  Use a walker.   DIET:  Regular.   DISCHARGE INSTRUCTIONS:  No alcohol, no smoking, and no driving.   FOLLOW UP:  The patient  is to follow up with Dr. Thurston Hole for postoperative  care in the next seven to ten days.  Follow up with Dr. Lysbeth Galas for routine  check.  Follow up with Dr. Lamar Benes as needed.     Greg Cutter, P.A.                    Ellwood Dense, M.D.    PP/MEDQ  D:  11/18/2002  T:  11/19/2002  Job:  [id number]   cc:   Delaney Meigs, M.D.  [address]  [place]  [place] [address]  Fax: [phone]   Elana Alm. Thurston Hole, M.D.  [address]  [place] [address]  Fax: [phone]

## 2011-03-15 NOTE — Op Note (Signed)
[place]  Patient:    Tracy Walker, Tracy Walker                       MRN: [id number] Proc. Date: 05/14/00 Adm. Date:  [id number] Disc. Date: [id number] Attending:  Tommy Medal                           Operative Report  NO DICTATION DD:  05/14/00 TD:  05/16/00 Job: [id number]

## 2011-03-15 NOTE — Op Note (Signed)
NAME:  Tracy Walker, Tracy Walker                          ACCOUNT NO.:  [id number]   MEDICAL RECORD NO.:  [id number]                   PATIENT TYPE:  INP   LOCATION:  5011                                 FACILITY:  [facility]   PHYSICIAN:  Elana Alm. Thurston Hole, M.D.              DATE OF BIRTH:  1928/07/11   DATE OF PROCEDURE:  11/02/2002  DATE OF DISCHARGE:                                 OPERATIVE REPORT   PREOPERATIVE DIAGNOSIS:  Left comminuted proximal tibial plateau fracture.   POSTOPERATIVE DIAGNOSES:  1. Left comminuted proximal tibial plateau fracture.  2. Lateral meniscus tear.   OPERATION:  1. Open reduction, internal fixation of left proximal tibial plateau     fracture with allograft bone graft.  2. Left lateral meniscus repair.   SURGEON:  Elana Alm. Thurston Hole, M.D.   ASSISTANT:  Gifford Shave, P.A.   ANESTHESIA:  General.   OPERATIVE TIME:  1 hour and 30 minutes.   COMPLICATIONS:  None.   DESCRIPTION OF PROCEDURE:  The patient was brought to the operating room on  11/02/2002,  placed on the operating room table in a supine position.  After  being placed under general anesthesia, her left leg was prepped using  sterile Duraprep and draped using sterile technique.  She received Ancef 1 g  IV preoperatively for prophylaxis.  Leg was exsanguinated, and thigh  tourniquet was elevated to 350 mmHg.  Initially through a 10 cm longitudinal  incision based over the patella and proximal tibia, initial exposure was  made. The underlying subcutaneous tissues were incised in line with the skin  incision. The lateral tibial plateau was exposed by subperiosteally  dissecting the anterior tibialis musculature off the anterior and lateral  aspect of the proximal tibia, and lateral arthrotomy was performed as well.  On initial inspection, she was found to have severe compaction and  comminution of the lateral tibial plateau.  A bone tamp was used to elevate  these fragments up and where the  defect was created after this was done, was  filled with allograft bone graft. Lateral meniscus tear was also found, and  this would be repaired at the end of the case. The articular surfaces on the  lateral femoral condyle, medial femoral condyle, medial tibial plateau were  intact. The medial meniscus was intact as well as the anterior and posterior  cruciate ligaments.  After the lateral joint surface had been elevated up,  then a 6-hole locking lateral tibial plateau plate was placed, and  sequentially the three proximal holes were pre-drilled under fluoroscopic  control, measured, and then the appropriate length cannulated locking 5.0 mm  screws were placed.  After this was done, the three most distal screw holes  in the plate were drilled, measured, tapped, and the appropriate length 4.5  mm cortical screws placed, and then another locking oblique 5.0 mm screw  placed.  After this was done, AP and  lateral fluoroscopic x-ray confirmed  satisfactory reduction of the fracture, satisfactory position of the  hardware.  The lateral tibial plateau was found to be in satisfactory  position as well.  At this point, then, the lateral meniscus was repaired  using 2-0 Vicryl suture.  After this was done, the wound was copiously  irrigated.  Then the arthrotomy was closed with 0 Vicryl suture.  The  anterior tibialis fascia was closed back to the anterior tibial periosteum  over the plate with 0 Vicryl.  Tourniquet was released.  Hemostasis was  achieved.  Subcutaneous tissues were then closed with 0 Vicryl, skin closed  with skin staples.  A Hemovac drain had been placed as well.  Sterile  dressings were applied and a long leg splint, and the patient was awakened  and taken to the recovery room in stable condition.  Needle and sponge  counts correct x 2 at the end of the case.                                                 Robert A. Thurston Hole, M.D.    RAW/MEDQ  D:  11/02/2002  T:  11/02/2002   Job:  [id number]

## 2011-04-04 ENCOUNTER — Other Ambulatory Visit: Payer: Self-pay | Admitting: Neurosurgery

## 2011-04-04 DIAGNOSIS — M47816 Spondylosis without myelopathy or radiculopathy, lumbar region: Secondary | ICD-10-CM

## 2011-04-10 ENCOUNTER — Ambulatory Visit
Admission: RE | Admit: 2011-04-10 | Discharge: 2011-04-10 | Disposition: A | Discharge status: Home / Self Care | Payer: Medicare Other | Source: Ambulatory Visit | Attending: Neurosurgery | Admitting: Neurosurgery

## 2011-04-10 DIAGNOSIS — M47816 Spondylosis without myelopathy or radiculopathy, lumbar region: Secondary | ICD-10-CM

## 2011-07-23 LAB — COMPREHENSIVE METABOLIC PANEL
ALT: 16
AST: 16
Alkaline Phosphatase: 41
CO2: 28
Calcium: 9.8
Chloride: 106
GFR calc Af Amer: 53 — ABNORMAL LOW
GFR calc non Af Amer: 44 — ABNORMAL LOW
Potassium: 4
Sodium: 140

## 2011-07-23 LAB — URINALYSIS, ROUTINE W REFLEX MICROSCOPIC
Glucose, UA: NEGATIVE
Ketones, ur: NEGATIVE
pH: 5

## 2011-07-23 LAB — ANAEROBIC CULTURE

## 2011-07-23 LAB — CBC
MCHC: 33.5
RBC: 4.48
WBC: 5.8

## 2011-07-23 LAB — WOUND CULTURE
Culture: NO GROWTH
Gram Stain: NONE SEEN

## 2011-07-24 LAB — URINE MICROSCOPIC-ADD ON

## 2011-07-24 LAB — CBC
HCT: 32.1 — ABNORMAL LOW
Hemoglobin: 12.5
Hemoglobin: 11 — ABNORMAL LOW
Hemoglobin: 8.3 — ABNORMAL LOW
MCHC: 34.1
MCHC: 34.5
MCHC: 34.9
MCV: 87.7
MCV: 89
Platelets: 220
Platelets: 323
RBC: 4.13
RBC: 2.74 — ABNORMAL LOW
RBC: 3.61 — ABNORMAL LOW
RDW: 14.6
RDW: 14.9
WBC: 6.9
WBC: 6.9

## 2011-07-24 LAB — BASIC METABOLIC PANEL
BUN: 22
BUN: 23
CO2: 29
Calcium: 8.5
Calcium: 8.8
Chloride: 103
Chloride: 98
Creatinine, Ser: 1.09
Creatinine, Ser: 1.18
Creatinine, Ser: 1.23 — ABNORMAL HIGH
GFR calc Af Amer: 51 — ABNORMAL LOW
GFR calc Af Amer: 54 — ABNORMAL LOW
GFR calc non Af Amer: 44 — ABNORMAL LOW
Glucose, Bld: 116 — ABNORMAL HIGH
Glucose, Bld: 94
Sodium: 138

## 2011-07-24 LAB — COMPREHENSIVE METABOLIC PANEL
ALT: 15
Alkaline Phosphatase: 47
CO2: 31
GFR calc non Af Amer: 52 — ABNORMAL LOW
Glucose, Bld: 110 — ABNORMAL HIGH
Potassium: 3.7
Sodium: 138

## 2011-07-24 LAB — URINE CULTURE
Colony Count: NO GROWTH
Culture: NO GROWTH
Special Requests: NEGATIVE

## 2011-07-24 LAB — URINALYSIS, ROUTINE W REFLEX MICROSCOPIC
Bilirubin Urine: NEGATIVE
Bilirubin Urine: NEGATIVE
Glucose, UA: NEGATIVE
Glucose, UA: NEGATIVE
Hgb urine dipstick: NEGATIVE
Hgb urine dipstick: NEGATIVE
Ketones, ur: NEGATIVE
Nitrite: NEGATIVE
Protein, ur: NEGATIVE
Specific Gravity, Urine: 1.02
Specific Gravity, Urine: 1.015
Urobilinogen, UA: 1
Urobilinogen, UA: 0.2
pH: 5

## 2011-07-24 LAB — DIFFERENTIAL
Basophils Relative: 0
Eosinophils Absolute: 0.1
Eosinophils Relative: 1
Monocytes Relative: 9
Neutrophils Relative %: 77

## 2011-07-24 LAB — CROSSMATCH
ABO/RH(D): A POS
Antibody Screen: NEGATIVE

## 2011-07-24 LAB — PROTIME-INR: INR: 1

## 2011-07-25 LAB — CBC
HCT: 32 — ABNORMAL LOW
Hemoglobin: 9.1 — ABNORMAL LOW
Hemoglobin: 9.2 — ABNORMAL LOW
Hemoglobin: 9.6 — ABNORMAL LOW
MCHC: 33.9
Platelets: 241
Platelets: 266
Platelets: 277
Platelets: 288
RDW: 15
RDW: 15
RDW: 15.1
WBC: 12.4 — ABNORMAL HIGH
WBC: 4.6
WBC: 7.3

## 2011-07-25 LAB — DIFFERENTIAL
Basophils Absolute: 0
Basophils Relative: 0
Eosinophils Relative: 0
Monocytes Absolute: 0.8
Monocytes Relative: 7

## 2011-07-25 LAB — AFB CULTURE WITH SMEAR (NOT AT ARMC)
Acid Fast Smear: NONE SEEN
Acid Fast Smear: NONE SEEN

## 2011-07-25 LAB — BASIC METABOLIC PANEL
Calcium: 8.6
Calcium: 8.9
Chloride: 104
GFR calc non Af Amer: 38 — ABNORMAL LOW
GFR calc non Af Amer: 40 — ABNORMAL LOW
GFR calc non Af Amer: 57 — ABNORMAL LOW
Glucose, Bld: 104 — ABNORMAL HIGH
Glucose, Bld: 116 — ABNORMAL HIGH
Glucose, Bld: 99
Potassium: 4.1
Potassium: 4.4
Sodium: 140
Sodium: 140
Sodium: 141

## 2011-07-25 LAB — C-REACTIVE PROTEIN: CRP: 21.3 — ABNORMAL HIGH (ref <0.6)

## 2011-07-25 LAB — CULTURE, BLOOD (ROUTINE X 2): Culture: NO GROWTH

## 2011-07-25 LAB — ANAEROBIC CULTURE

## 2011-07-25 LAB — COMPREHENSIVE METABOLIC PANEL
ALT: 13
AST: 12
Albumin: 2.9 — ABNORMAL LOW
Alkaline Phosphatase: 76
Chloride: 105
GFR calc Af Amer: 38 — ABNORMAL LOW
Potassium: 4.8
Sodium: 136
Total Bilirubin: 0.8
Total Protein: 5.9 — ABNORMAL LOW

## 2011-07-25 LAB — URINALYSIS, ROUTINE W REFLEX MICROSCOPIC
Bilirubin Urine: NEGATIVE
Hgb urine dipstick: NEGATIVE
Nitrite: NEGATIVE
Protein, ur: NEGATIVE
Specific Gravity, Urine: 1.016
Urobilinogen, UA: 0.2

## 2011-07-25 LAB — FUNGUS CULTURE W SMEAR: Fungal Smear: NONE SEEN

## 2011-07-25 LAB — WOUND CULTURE

## 2011-07-25 LAB — URINE CULTURE
Colony Count: NO GROWTH
Culture: NO GROWTH

## 2011-07-25 LAB — CARDIAC PANEL(CRET KIN+CKTOT+MB+TROPI)
CK, MB: 0.7
Relative Index: INVALID
Relative Index: INVALID
Relative Index: INVALID
Total CK: 11
Total CK: 14

## 2011-08-09 LAB — URINE CULTURE: Colony Count: >=100000

## 2011-08-09 LAB — BASIC METABOLIC PANEL
BUN: 11
CO2: 27
CO2: 27
Calcium: 8.7
Chloride: 106
Chloride: 107
GFR calc Af Amer: >60
Glucose, Bld: 104 — ABNORMAL HIGH
Glucose, Bld: 112 — ABNORMAL HIGH
Potassium: 4.4
Sodium: 139
Sodium: 139

## 2011-08-09 LAB — CBC
HCT: 30.3 — ABNORMAL LOW
HCT: 30.8 — ABNORMAL LOW
HCT: 31 — ABNORMAL LOW
Hemoglobin: 10.4 — ABNORMAL LOW
Hemoglobin: 11.1 — ABNORMAL LOW
Hemoglobin: 12.2
MCHC: 33.8
MCHC: 34.2
MCHC: 34.3
MCV: 87.9
MCV: 88.7
MCV: 89.1
Platelets: 230
RBC: 3.45 — ABNORMAL LOW
RBC: 3.46 — ABNORMAL LOW
RBC: 4.08
RDW: 15.7 — ABNORMAL HIGH
RDW: 15.9 — ABNORMAL HIGH
WBC: 11.3 — ABNORMAL HIGH
WBC: 7.7

## 2011-08-09 LAB — URINALYSIS, ROUTINE W REFLEX MICROSCOPIC
Bilirubin Urine: NEGATIVE
Glucose, UA: NEGATIVE
Hgb urine dipstick: NEGATIVE
Ketones, ur: NEGATIVE
Nitrite: NEGATIVE
Specific Gravity, Urine: 1.015
pH: 6.5

## 2011-08-09 LAB — DIFFERENTIAL
Basophils Absolute: 0
Basophils Relative: 0
Eosinophils Absolute: 0
Eosinophils Relative: 0
Lymphs Abs: 0.8

## 2011-08-09 LAB — COMPREHENSIVE METABOLIC PANEL
ALT: 18
AST: 21
Alkaline Phosphatase: 47
CO2: 27
Chloride: 111
GFR calc Af Amer: 53 — ABNORMAL LOW
GFR calc non Af Amer: 44 — ABNORMAL LOW
Potassium: 4.2
Sodium: 144
Total Bilirubin: 0.7

## 2011-08-09 LAB — URINE MICROSCOPIC-ADD ON

## 2011-08-09 LAB — CROSSMATCH

## 2011-08-09 LAB — PROTIME-INR
INR: 1.6 — ABNORMAL HIGH
Prothrombin Time: 21.1 — ABNORMAL HIGH
Prothrombin Time: 21.3 — ABNORMAL HIGH

## 2011-08-12 LAB — CBC
HCT: 31.6 — ABNORMAL LOW
HCT: 32.6 — ABNORMAL LOW
Hemoglobin: 10.6 — ABNORMAL LOW
Hemoglobin: 10.9 — ABNORMAL LOW
RBC: 3.74 — ABNORMAL LOW
RDW: 15.6 — ABNORMAL HIGH
RDW: 15.8 — ABNORMAL HIGH
WBC: 4.7
WBC: 6.1

## 2011-08-12 LAB — PROTIME-INR
INR: 1.2
INR: 1.7 — ABNORMAL HIGH
Prothrombin Time: 15.6 — ABNORMAL HIGH
Prothrombin Time: 20.2 — ABNORMAL HIGH

## 2011-08-13 LAB — CARDIAC PANEL(CRET KIN+CKTOT+MB+TROPI)
CK, MB: 4.1 — ABNORMAL HIGH
CK, MB: 5.9 — ABNORMAL HIGH
Relative Index: INVALID
Total CK: 30
Troponin I: 0.61

## 2011-08-13 LAB — URINALYSIS, ROUTINE W REFLEX MICROSCOPIC
Bilirubin Urine: NEGATIVE
Ketones, ur: NEGATIVE
Nitrite: NEGATIVE
Protein, ur: NEGATIVE
Urobilinogen, UA: 0.2

## 2011-08-13 LAB — I-STAT 8, (EC8 V) (CONVERTED LAB)
Bicarbonate: 23.3
Glucose, Bld: 128 — ABNORMAL HIGH
Hemoglobin: 14.3
Sodium: 134 — ABNORMAL LOW
TCO2: 24

## 2011-08-13 LAB — PROTIME-INR
INR: 1.1
INR: 1.1
Prothrombin Time: 14.4
Prothrombin Time: 14.8

## 2011-08-13 LAB — LIPID PANEL
LDL Cholesterol: 115 — ABNORMAL HIGH
Total CHOL/HDL Ratio: 7.2
VLDL: 33

## 2011-08-13 LAB — BASIC METABOLIC PANEL
BUN: 8
Calcium: 8.8
GFR calc non Af Amer: 42 — ABNORMAL LOW
Glucose, Bld: 102 — ABNORMAL HIGH
Potassium: 4.5

## 2011-08-13 LAB — DIFFERENTIAL
Lymphocytes Relative: 11 — ABNORMAL LOW
Monocytes Absolute: 0.9 — ABNORMAL HIGH
Monocytes Relative: 6
Neutro Abs: 13.3 — ABNORMAL HIGH

## 2011-08-13 LAB — CBC
HCT: 33.2 — ABNORMAL LOW
Hemoglobin: 11 — ABNORMAL LOW
Hemoglobin: 11.2 — ABNORMAL LOW
Hemoglobin: 12.5
MCHC: 33.5
MCHC: 33.7
MCV: 87
Platelets: 265
Platelets: 353
RBC: 3.78 — ABNORMAL LOW
RDW: 16.1 — ABNORMAL HIGH
RDW: 16.2 — ABNORMAL HIGH
WBC: 16.1 — ABNORMAL HIGH
WBC: 6.6

## 2011-08-13 LAB — POCT CARDIAC MARKERS
CKMB, poc: 4.2
Operator id: 257131
Troponin i, poc: 0.16 — ABNORMAL HIGH
Troponin i, poc: 0.29 — ABNORMAL HIGH

## 2011-08-13 LAB — CK TOTAL AND CKMB (NOT AT ARMC)
CK, MB: 3.2
Total CK: 30

## 2011-08-13 LAB — POCT I-STAT CREATININE
Creatinine, Ser: 1.8 — ABNORMAL HIGH
Operator id: 257131

## 2011-08-13 LAB — COMPREHENSIVE METABOLIC PANEL
AST: 28
Albumin: 2.7 — ABNORMAL LOW
BUN: 11
Creatinine, Ser: 1.74 — ABNORMAL HIGH
GFR calc Af Amer: 34 — ABNORMAL LOW
Potassium: 5
Total Protein: 7.4

## 2011-08-13 LAB — CULTURE, BLOOD (ROUTINE X 2): Culture: NO GROWTH

## 2011-08-13 LAB — TSH: TSH: 6.031 — ABNORMAL HIGH

## 2011-08-13 LAB — HEPARIN LEVEL (UNFRACTIONATED)
Heparin Unfractionated: 0.39
Heparin Unfractionated: 0.45

## 2011-08-13 LAB — TROPONIN I: Troponin I: 0.19 — ABNORMAL HIGH

## 2011-08-13 LAB — APTT: aPTT: 31

## 2011-08-23 LAB — URINE CULTURE: Colony Count: 30000

## 2013-03-10 ENCOUNTER — Other Ambulatory Visit: Payer: Self-pay

## 2013-03-10 MED ORDER — FUROSEMIDE 20 MG PO TABS: 20.0000 mg | ORAL_TABLET | Freq: Two times a day (BID) | ORAL | Status: DC

## 2013-03-10 NOTE — Telephone Encounter (Signed)
Last seen 9/13 

## 2013-04-01 ENCOUNTER — Encounter: Payer: Self-pay | Admitting: Physician Assistant

## 2013-04-01 ENCOUNTER — Telehealth: Payer: Self-pay | Admitting: Family Medicine

## 2013-04-01 ENCOUNTER — Ambulatory Visit (INDEPENDENT_AMBULATORY_CARE_PROVIDER_SITE_OTHER): Payer: Medicare Other | Admitting: Physician Assistant

## 2013-04-01 VITALS — BP 158/82 | HR 82 | Temp 96.9°F | Ht 63.0 in | Wt 165.0 lb

## 2013-04-01 DIAGNOSIS — R5383 Other fatigue: Secondary | ICD-10-CM

## 2013-04-01 DIAGNOSIS — K219 Gastro-esophageal reflux disease without esophagitis: Secondary | ICD-10-CM | POA: Insufficient documentation

## 2013-04-01 DIAGNOSIS — R5381 Other malaise: Secondary | ICD-10-CM

## 2013-04-01 DIAGNOSIS — I10 Essential (primary) hypertension: Secondary | ICD-10-CM

## 2013-04-01 LAB — POCT CBC
Granulocyte percent: 80.4 %G — AB (ref 37–80)
Hemoglobin: 13.3 g/dL (ref 12.2–16.2)
MCHC: 34.2 g/dL (ref 31.8–35.4)
POC Granulocyte: 6 (ref 2–6.9)
POC LYMPH PERCENT: 18.4 %L (ref 10–50)
RDW, POC: 15 %

## 2013-04-01 LAB — GLUCOSE, POCT (MANUAL RESULT ENTRY): POC Glucose: 107 mg/dl — AB (ref 70–99)

## 2013-04-01 NOTE — Telephone Encounter (Signed)
appt given with Tracy Walker

## 2013-04-01 NOTE — Progress Notes (Signed)
Subjective:     Patient ID: Tracy Walker, female   DOB: 19-Mar-1928, 79 y.o.   MRN: [id number]  HPI Pt went to the dentist yesterday and had an episode of weakness She ate her breakfast in the am and then went to have 2 cavities filled She did not eat before appt which was at 1 pm  The procedure was then almost 2 hrs At the end she had a HA and felt weak and shaky They gave her something to drink but sx remained Still with some fatigue today   Review of Systems  All other systems reviewed and are negative.       Objective:   Physical Exam  Nursing note and vitals reviewed. NAD Alert and interactive No JVD/Bruits Heart - RRR w/o M Lungs- CTA bilat Trace lower ext edema CBC/BS- see labs    Assessment:     1. Other malaise and fatigue   2. GERD (gastroesophageal reflux disease)   3. HTN (hypertension)        Plan:     Discussed with pt sx could of been from low BS and not eating Cont her current meds Good diet and fluids Activities as tol F/U prn

## 2013-04-01 NOTE — Patient Instructions (Signed)

## 2013-04-08 ENCOUNTER — Other Ambulatory Visit: Payer: Self-pay | Admitting: *Deleted

## 2013-04-08 MED ORDER — OMEPRAZOLE 20 MG PO CPDR: 20.0000 mg | DELAYED_RELEASE_CAPSULE | Freq: Two times a day (BID) | ORAL | Status: DC

## 2013-04-10 DIAGNOSIS — R079 Chest pain, unspecified: Secondary | ICD-10-CM

## 2013-05-03 ENCOUNTER — Ambulatory Visit (INDEPENDENT_AMBULATORY_CARE_PROVIDER_SITE_OTHER): Payer: Medicare Other | Admitting: General Practice

## 2013-05-03 ENCOUNTER — Telehealth: Payer: Self-pay | Admitting: Nurse Practitioner

## 2013-05-03 ENCOUNTER — Other Ambulatory Visit: Payer: Self-pay

## 2013-05-03 ENCOUNTER — Encounter: Payer: Self-pay | Admitting: General Practice

## 2013-05-03 VITALS — BP 136/59 | HR 50 | Temp 97.6°F | Wt 169.0 lb

## 2013-05-03 DIAGNOSIS — L0390 Cellulitis, unspecified: Secondary | ICD-10-CM

## 2013-05-03 MED ORDER — FUROSEMIDE 20 MG PO TABS: 20.0000 mg | ORAL_TABLET | Freq: Two times a day (BID) | ORAL | Status: DC

## 2013-05-03 NOTE — Telephone Encounter (Signed)
Appt given for today 

## 2013-05-03 NOTE — Progress Notes (Signed)
  Subjective:    Patient ID: Tracy Walker, female    DOB: January 15, 1928, 79 y.o.   MRN: [id number]  HPI Patient presents today for hospital follow up. She was seen at [facility] emergency department for cellulitis on bilateral lower leg. She reports that her legs feel and look better. She reports taking medication as prescribed.     Review of Systems  Constitutional: Negative for fever and chills.  Respiratory: Negative for chest tightness and shortness of breath.   Cardiovascular: Negative for chest pain and palpitations.  Skin:       a small area to bilateral legs with slight discoloration.        Objective:   Physical Exam  Constitutional: She is oriented to person, place, and time. She appears well-developed and well-nourished.  Cardiovascular: Normal rate, regular rhythm and normal heart sounds.   Pulmonary/Chest: Effort normal and breath sounds normal. No respiratory distress. She exhibits no tenderness.  Neurological: She is alert and oriented to person, place, and time.  Skin: Skin is warm and dry.  Psychiatric: She has a normal mood and affect.          Assessment & Plan:  1. Cellulitis -Resolved Cellulitis -keep skin warm and dry -RTO if symptoms return -Patient verbalized understanding -Coralie Keens, FNP-C

## 2013-07-02 ENCOUNTER — Other Ambulatory Visit: Payer: Self-pay | Admitting: *Deleted

## 2013-07-02 MED ORDER — TOLTERODINE TARTRATE ER 4 MG PO CP24: 4.0000 mg | ORAL_CAPSULE | Freq: Every day | ORAL | Status: DC

## 2013-07-02 MED ORDER — OMEPRAZOLE 20 MG PO CPDR: 20.0000 mg | DELAYED_RELEASE_CAPSULE | Freq: Two times a day (BID) | ORAL | Status: DC

## 2013-07-08 ENCOUNTER — Telehealth: Payer: Self-pay | Admitting: General Practice

## 2013-07-08 ENCOUNTER — Telehealth: Payer: Self-pay | Admitting: Family Medicine

## 2013-07-08 NOTE — Telephone Encounter (Signed)
error 

## 2013-07-09 ENCOUNTER — Telehealth: Payer: Self-pay | Admitting: General Practice

## 2013-07-09 NOTE — Telephone Encounter (Signed)
Discussed with patient's son. She went to the urgent care yesterday and was given an antibiotic shot and pills for cellulitis to lower extremities. She needs a f/u visit. The areas are not worsening. Appt scheduled for Monday. Son aware that we have Saturday hours and that someone is on call at all times if they need assistance.

## 2013-07-11 ENCOUNTER — Other Ambulatory Visit: Payer: Self-pay | Admitting: General Practice

## 2013-07-11 DIAGNOSIS — I10 Essential (primary) hypertension: Secondary | ICD-10-CM

## 2013-07-11 MED ORDER — FUROSEMIDE 20 MG PO TABS: 20.0000 mg | ORAL_TABLET | Freq: Two times a day (BID) | ORAL | Status: DC

## 2013-07-11 MED ORDER — TOLTERODINE TARTRATE ER 4 MG PO CP24: 4.0000 mg | ORAL_CAPSULE | Freq: Every day | ORAL | Status: DC

## 2013-07-11 NOTE — Telephone Encounter (Signed)
Patient has refills on all meds execept catapres 0.2 mg bid, could you please verify with pharmacy that she has been getting this medication . If so she may have with 3 refills. thanks

## 2013-07-12 ENCOUNTER — Ambulatory Visit (INDEPENDENT_AMBULATORY_CARE_PROVIDER_SITE_OTHER): Payer: Medicare Other | Admitting: Family Medicine

## 2013-07-12 ENCOUNTER — Telehealth: Payer: Self-pay | Admitting: *Deleted

## 2013-07-12 ENCOUNTER — Encounter: Payer: Self-pay | Admitting: Family Medicine

## 2013-07-12 ENCOUNTER — Other Ambulatory Visit: Payer: Self-pay

## 2013-07-12 ENCOUNTER — Emergency Department (HOSPITAL_COMMUNITY)
Admission: EM | Admit: 2013-07-12 | Discharge: 2013-07-12 | Disposition: A | Discharge status: Home / Self Care | Payer: Medicare Other | Attending: Emergency Medicine | Admitting: Emergency Medicine

## 2013-07-12 ENCOUNTER — Encounter (HOSPITAL_COMMUNITY): Payer: Self-pay

## 2013-07-12 VITALS — BP 212/84 | HR 80 | Temp 97.4°F | Ht 63.0 in | Wt 161.2 lb

## 2013-07-12 DIAGNOSIS — Z79899 Other long term (current) drug therapy: Secondary | ICD-10-CM | POA: Insufficient documentation

## 2013-07-12 DIAGNOSIS — I10 Essential (primary) hypertension: Secondary | ICD-10-CM | POA: Insufficient documentation

## 2013-07-12 DIAGNOSIS — Z87891 Personal history of nicotine dependence: Secondary | ICD-10-CM | POA: Insufficient documentation

## 2013-07-12 DIAGNOSIS — L0390 Cellulitis, unspecified: Secondary | ICD-10-CM

## 2013-07-12 DIAGNOSIS — L0291 Cutaneous abscess, unspecified: Secondary | ICD-10-CM

## 2013-07-12 DIAGNOSIS — K219 Gastro-esophageal reflux disease without esophagitis: Secondary | ICD-10-CM | POA: Insufficient documentation

## 2013-07-12 MED ORDER — CLONIDINE HCL 0.1 MG/24HR TD PTWK
0.2000 mg | MEDICATED_PATCH | Freq: Once | TRANSDERMAL | Status: DC
  Administered 2013-07-12: 0.2 mg via TRANSDERMAL
  Filled 2013-07-12 (×2): qty 1

## 2013-07-12 NOTE — ED Notes (Signed)
catapres patches removed per md order

## 2013-07-12 NOTE — Patient Instructions (Signed)
Cellulitis Cellulitis is an infection of the skin and the tissue beneath it. The infected area is usually red and tender. Cellulitis occurs most often in the arms and lower legs.  CAUSES  Cellulitis is caused by bacteria that enter the skin through cracks or cuts in the skin. The most common types of bacteria that cause cellulitis are Staphylococcus and Streptococcus. SYMPTOMS   Redness and warmth.  Swelling.  Tenderness or pain.  Fever. DIAGNOSIS  Your caregiver can usually determine what is wrong based on a physical exam. Blood tests may also be done. TREATMENT  Treatment usually involves taking an antibiotic medicine. HOME CARE INSTRUCTIONS   Take your antibiotics as directed. Finish them even if you start to feel better.  Keep the infected arm or leg elevated to reduce swelling.  Apply a warm cloth to the affected area up to 4 times per day to relieve pain.  Only take over-the-counter or prescription medicines for pain, discomfort, or fever as directed by your caregiver.  Keep all follow-up appointments as directed by your caregiver. SEEK MEDICAL CARE IF:   You notice red streaks coming from the infected area.  Your red area gets larger or turns dark in color.  Your bone or joint underneath the infected area becomes painful after the skin has healed.  Your infection returns in the same area or another area.  You notice a swollen bump in the infected area.  You develop new symptoms. SEEK IMMEDIATE MEDICAL CARE IF:   You have a fever.  You feel very sleepy.  You develop vomiting or diarrhea.  You have a general ill feeling (malaise) with muscle aches and pains. MAKE SURE YOU:   Understand these instructions.  Will watch your condition.  Will get help right away if you are not doing well or get worse. Document Released: 07/24/2005 Document Revised: 04/14/2012 Document Reviewed: 12/30/2011 Exit[employer] Patient Information 2014 [employer].  

## 2013-07-12 NOTE — ED Provider Notes (Signed)
CSN: [id number]     Arrival date & time 07/12/13  1652 History   First MD Initiated Contact with Patient 07/12/13 1656     Chief Complaint  Patient presents with  . Hypertension   (Consider location/radiation/quality/duration/timing/severity/associated sxs/prior Treatment) Patient is a 79 y.o. female presenting with hypertension. The history is provided by the patient (the pt came here because her bp is high). No language interpreter was used.  Hypertension This is a new problem. The current episode started 2 days ago. The problem occurs constantly. The problem has not changed since onset.Pertinent negatives include no chest pain, no abdominal pain and no headaches. Nothing aggravates the symptoms. Nothing relieves the symptoms.    Past Medical History  Diagnosis Date  . GERD (gastroesophageal reflux disease)   . Hypertension   . Overactive bladder    Past Surgical History  Procedure Laterality Date  . Total hip arthroplasty      right  . Total knee arthroplasty      Left  . Abdominal hysterectomy     No family history on file. History  Substance Use Topics  . Smoking status: Former Games developer  . Smokeless tobacco: Not on file  . Alcohol Use: No   OB History   Grav Para Term Preterm Abortions TAB SAB Ect Mult Living                 Review of Systems  Constitutional: Negative for appetite change and fatigue.  HENT: Negative for congestion, sinus pressure and ear discharge.   Eyes: Negative for discharge.  Respiratory: Negative for cough.   Cardiovascular: Negative for chest pain.  Gastrointestinal: Negative for abdominal pain and diarrhea.  Genitourinary: Negative for frequency and hematuria.  Musculoskeletal: Negative for back pain.  Skin: Negative for rash.  Neurological: Negative for seizures and headaches.  Psychiatric/Behavioral: Negative for hallucinations.    Allergies  Review of patient's allergies indicates no known allergies.  Home Medications   Current  Outpatient Rx  Name  Route  Sig  Dispense  Refill  . cloNIDine (CATAPRES) 0.2 MG tablet   Oral   Take 0.2 mg by mouth 2 (two) times daily.         . furosemide (LASIX) 20 MG tablet   Oral   Take 1 tablet (20 mg total) by mouth 2 (two) times daily.   60 tablet   3   . tolterodine (DETROL LA) 4 MG 24 hr capsule   Oral   Take 1 capsule (4 mg total) by mouth daily.   30 capsule   6   . omeprazole (PRILOSEC) 20 MG capsule   Oral   Take 1 capsule (20 mg total) by mouth 2 (two) times daily.   60 capsule   3    BP 171/91  Pulse 57  Temp(Src) 98.1 F (36.7 C) (Oral)  Resp 18  SpO2 97% Physical Exam  Constitutional: She is oriented to person, place, and time. She appears well-developed.  HENT:  Head: Normocephalic.  Eyes: Conjunctivae and EOM are normal. No scleral icterus.  Neck: Neck supple. No thyromegaly present.  Cardiovascular: Normal rate and regular rhythm.  Exam reveals no gallop and no friction rub.   No murmur heard. Pulmonary/Chest: No stridor. She has no wheezes. She has no rales. She exhibits no tenderness.  Abdominal: She exhibits no distension. There is no tenderness. There is no rebound.  Musculoskeletal: Normal range of motion. She exhibits no edema.  Lymphadenopathy:    She has no cervical  adenopathy.  Neurological: She is oriented to person, place, and time. Coordination normal.  Skin: No rash noted. No erythema.  Psychiatric: She has a normal mood and affect. Her behavior is normal.    ED Course  Procedures (including critical care time) Labs Review Labs Reviewed - No data to display Imaging Review No results found.  MDM   1. Hypertension    pts bp improved with clonidine.  She takes .2mg  bid.  We will have her take an extra .2mg  daily if her bp systolic is over 213    Benny Lennert, MD 07/12/13 1924

## 2013-07-12 NOTE — Telephone Encounter (Signed)
Patient was told at appt to take an extra clonidine today to decrease blood pressure. She took it at 11 am and at 2:30 her blood pressure had increased to 230/96. Asked Claris Che to call 911.  EMS will rck it and transport her if necessary. F/u PRN.

## 2013-07-12 NOTE — Progress Notes (Signed)
  Subjective:    Patient ID: Tracy Walker, female    DOB: 09-Feb-1928, 79 y.o.   MRN: [id number]  HPI This 79 y.o. female presents for evaluation of follow up on cellulitis of the left leg. She was recently seen for left lower extremity cellulitis and she was tx'd with an Injection and keflex abx's.  She states the abx make her bp elevated.  She is doing a lot better.   Review of Systems C/o cellulitis left lower extremity No chest pain, SOB, HA, dizziness, vision change, N/V, diarrhea, constipation, dysuria, urinary urgency or frequency, myalgias, arthralgias or rash.     Objective:   Physical Exam Vital signs noted  Well developed well nourished female.  HEENT - Head atraumatic Normocephalic Respiratory - Lungs CTA bilateral Cardiac - RRR S1 and S2 without murmur GI - Abdomen soft Nontender and bowel sounds active x 4 Extremities - Left lower extremity with swelling but no cellulitis. Marked area without cellulitis. Neuro - Grossly intact.       Assessment & Plan:  celluliits - Resolved.  Recommend DC keflex.  HTN - Take xtra dose of clonidine for next few days.

## 2013-07-12 NOTE — ED Notes (Signed)
Pt up to br with assistance.  

## 2013-07-12 NOTE — ED Notes (Signed)
Per ems, pt was seen by her pcp today and was sent home with a bp of 220/80.  Pt reports checking it at home and it would not decrease.

## 2013-07-14 ENCOUNTER — Ambulatory Visit: Payer: Medicare Other | Admitting: Family Medicine

## 2013-07-21 ENCOUNTER — Encounter: Payer: Self-pay | Admitting: Family Medicine

## 2013-07-21 ENCOUNTER — Telehealth: Payer: Self-pay | Admitting: Family Medicine

## 2013-07-21 ENCOUNTER — Ambulatory Visit (INDEPENDENT_AMBULATORY_CARE_PROVIDER_SITE_OTHER): Payer: Medicare Other | Admitting: Family Medicine

## 2013-07-21 VITALS — BP 192/83 | HR 79 | Temp 97.1°F | Ht 63.0 in | Wt 162.0 lb

## 2013-07-21 DIAGNOSIS — I10 Essential (primary) hypertension: Secondary | ICD-10-CM

## 2013-07-21 MED ORDER — FUROSEMIDE 20 MG PO TABS: 20.0000 mg | ORAL_TABLET | Freq: Two times a day (BID) | ORAL | Status: DC

## 2013-07-21 MED ORDER — CLONIDINE HCL 0.2 MG PO TABS: ORAL_TABLET | ORAL | Status: DC

## 2013-07-21 MED ORDER — CLONIDINE HCL 0.1 MG PO TABS
0.2000 mg | ORAL_TABLET | Freq: Once | ORAL | Status: AC
  Administered 2013-07-21: 0.2 mg via ORAL

## 2013-07-21 NOTE — Patient Instructions (Signed)

## 2013-07-21 NOTE — Progress Notes (Signed)
  Subjective:    Patient ID: Tracy Walker, female    DOB: 02/23/1928, 79 y.o.   MRN: [id number]  HPI  This 79 y.o. female presents for evaluation of hypertension.  She was seen last week for follow Up on her cellulitis and it was resolving.  She states she took her bp at home and it was 220/90 and so she went to the ED where she was tx with clonidine and was DC'd.  She states her bp is running high and she feels fine.  Review of SystemsC/o htn No chest pain, SOB, HA, dizziness, vision change, N/V, diarrhea, constipation, dysuria, urinary urgency or frequency, myalgias, arthralgias or rash.     Objective:   Physical Exam Vital signs noted  Well developed well nourished female.  HEENT - Head atraumatic Normocephalic                Eyes - PERRLA, Conjuctiva - clear Sclera- Clear EOMI                Throat - oropharanx wnl Respiratory - Lungs CTA bilateral Cardiac - RRR S1 and S2 without murmur GI - Abdomen soft Nontender and bowel sounds active x 4 Extremities - No edema. Neuro - Grossly intact.       Assessment & Plan:  Hypertension - Plan: cloNIDine (CATAPRES) tablet 0.2 mg po tid Increase Lasix 20mg  2 po bid for a week and then follow up in one week. Clonidine 0.2mg  given here in clinic.

## 2013-07-28 ENCOUNTER — Ambulatory Visit (INDEPENDENT_AMBULATORY_CARE_PROVIDER_SITE_OTHER): Payer: Medicare Other | Admitting: Family Medicine

## 2013-07-28 ENCOUNTER — Encounter: Payer: Self-pay | Admitting: Family Medicine

## 2013-07-28 VITALS — BP 134/56 | HR 51 | Temp 97.3°F | Ht 63.0 in | Wt 165.0 lb

## 2013-07-28 DIAGNOSIS — I10 Essential (primary) hypertension: Secondary | ICD-10-CM

## 2013-07-28 DIAGNOSIS — Z23 Encounter for immunization: Secondary | ICD-10-CM

## 2013-07-28 LAB — POCT CBC
Granulocyte percent: 69.4 %G (ref 37–80)
HCT, POC: 36 % — AB (ref 37.7–47.9)
Hemoglobin: 11.9 g/dL — AB (ref 12.2–16.2)
Lymph, poc: 1.6 (ref 0.6–3.4)
MCH, POC: 27.6 pg (ref 27–31.2)
MCHC: 33 g/dL (ref 31.8–35.4)
MCV: 83.6 fL (ref 80–97)
MPV: 7.4 fL (ref 0–99.8)
POC Granulocyte: 5.3 (ref 2–6.9)
POC LYMPH PERCENT: 20.9 %L (ref 10–50)
Platelet Count, POC: 283 10*3/uL (ref 142–424)
RBC: 4.3 M/uL (ref 4.04–5.48)
RDW, POC: 17.4 %
WBC: 7.7 10*3/uL (ref 4.6–10.2)

## 2013-07-28 NOTE — Telephone Encounter (Signed)
Patient is suppose to take Lasix BID and Clonidine TID. New scripts were sent into the pharmacy with those directions on the day of her appointment.

## 2013-07-28 NOTE — Patient Instructions (Signed)

## 2013-07-28 NOTE — Progress Notes (Signed)
  Subjective:    Patient ID: Tracy Walker, female    DOB: [birth year], 79 y.o.   MRN: [id number]  HPI This 79 y.o. female presents for evaluation of follow up on hypertension. She has brought in some readings from home and her bp has come down To 140/80.  She is feeling better.  She had her clonidine increased to tid and her lasix doubled.   Review of Systems No chest pain, SOB, HA, dizziness, vision change, N/V, diarrhea, constipation, dysuria, urinary urgency or frequency, myalgias, arthralgias or rash.     Objective:   Physical Exam Vital signs noted  Well developed well nourished female.  HEENT - Head atraumatic Normocephalic                Eyes - PERRLA, Conjuctiva - clear Sclera- Clear EOMI                Ears - EAC's Wnl TM's Wnl Gross Hearing WNL                Throat - oropharanx wnl Respiratory - Lungs CTA bilateral Cardiac - RRR S1 and S2 without murmur Neuro - Grossly intact.       Assessment & Plan:  Essential hypertension, benign Continue clonidine tid and drop lasix 20mg  back to one po bid. Follow up in 3 months or if bp not controlled. Check cmp, cbc, tsh.  Deatra Canter FNP

## 2013-07-29 LAB — CMP14+EGFR
ALT: 12 IU/L (ref 0–32)
AST: 16 IU/L (ref 0–40)
Albumin/Globulin Ratio: 1.4 (ref 1.1–2.5)
Albumin: 4 g/dL (ref 3.5–4.7)
Alkaline Phosphatase: 67 IU/L (ref 39–117)
BUN/Creatinine Ratio: 18 (ref 11–26)
BUN: 41 mg/dL — ABNORMAL HIGH (ref 8–27)
CO2: 24 mmol/L (ref 18–29)
Calcium: 8.8 mg/dL (ref 8.6–10.2)
Chloride: 101 mmol/L (ref 97–108)
Creatinine, Ser: 2.3 mg/dL — ABNORMAL HIGH (ref 0.57–1.00)
GFR calc Af Amer: 22 mL/min/{1.73_m2} — ABNORMAL LOW (ref >59)
GFR calc non Af Amer: 19 mL/min/{1.73_m2} — ABNORMAL LOW (ref >59)
Globulin, Total: 2.8 g/dL (ref 1.5–4.5)
Glucose: 95 mg/dL (ref 65–99)
Potassium: 5.3 mmol/L — ABNORMAL HIGH (ref 3.5–5.2)
Sodium: 142 mmol/L (ref 134–144)
Total Bilirubin: 0.2 mg/dL (ref 0.0–1.2)
Total Protein: 6.8 g/dL (ref 6.0–8.5)

## 2013-07-29 LAB — THYROID PANEL WITH TSH
Free Thyroxine Index: 2 (ref 1.2–4.9)
T3 Uptake Ratio: 34 % (ref 24–39)
T4, Total: 5.9 ug/dL (ref 4.5–12.0)
TSH: 9.88 u[IU]/mL — ABNORMAL HIGH (ref 0.450–4.500)

## 2013-07-30 ENCOUNTER — Other Ambulatory Visit: Payer: Self-pay | Admitting: Family Medicine

## 2013-07-30 DIAGNOSIS — E039 Hypothyroidism, unspecified: Secondary | ICD-10-CM

## 2013-07-30 MED ORDER — LEVOTHYROXINE SODIUM 25 MCG PO TABS: 25.0000 ug | ORAL_TABLET | Freq: Every day | ORAL | Status: DC

## 2013-08-02 ENCOUNTER — Encounter: Payer: Self-pay | Admitting: Family Medicine

## 2013-08-02 ENCOUNTER — Telehealth: Payer: Self-pay | Admitting: Family Medicine

## 2013-08-02 ENCOUNTER — Ambulatory Visit (HOSPITAL_COMMUNITY)
Admission: RE | Admit: 2013-08-02 | Discharge: 2013-08-02 | Disposition: A | Discharge status: Home / Self Care | Payer: Medicare Other | Source: Ambulatory Visit | Attending: Family Medicine | Admitting: Family Medicine

## 2013-08-02 ENCOUNTER — Encounter (HOSPITAL_COMMUNITY): Payer: Self-pay | Admitting: *Deleted

## 2013-08-02 ENCOUNTER — Ambulatory Visit (INDEPENDENT_AMBULATORY_CARE_PROVIDER_SITE_OTHER): Payer: Medicare Other | Admitting: Family Medicine

## 2013-08-02 ENCOUNTER — Ambulatory Visit (INDEPENDENT_AMBULATORY_CARE_PROVIDER_SITE_OTHER): Payer: Medicare Other

## 2013-08-02 ENCOUNTER — Emergency Department (HOSPITAL_COMMUNITY)
Admission: EM | Admit: 2013-08-02 | Discharge: 2013-08-02 | Disposition: A | Discharge status: Home / Self Care | Payer: Medicare Other | Attending: Emergency Medicine | Admitting: Emergency Medicine

## 2013-08-02 VITALS — BP 158/66 | HR 60 | Temp 97.9°F | Wt 167.0 lb

## 2013-08-02 DIAGNOSIS — L02419 Cutaneous abscess of limb, unspecified: Secondary | ICD-10-CM

## 2013-08-02 DIAGNOSIS — I82402 Acute embolism and thrombosis of unspecified deep veins of left lower extremity: Secondary | ICD-10-CM

## 2013-08-02 DIAGNOSIS — Z87891 Personal history of nicotine dependence: Secondary | ICD-10-CM | POA: Insufficient documentation

## 2013-08-02 DIAGNOSIS — I10 Essential (primary) hypertension: Secondary | ICD-10-CM | POA: Insufficient documentation

## 2013-08-02 DIAGNOSIS — I878 Other specified disorders of veins: Secondary | ICD-10-CM | POA: Insufficient documentation

## 2013-08-02 DIAGNOSIS — K219 Gastro-esophageal reflux disease without esophagitis: Secondary | ICD-10-CM | POA: Insufficient documentation

## 2013-08-02 DIAGNOSIS — Z87448 Personal history of other diseases of urinary system: Secondary | ICD-10-CM | POA: Insufficient documentation

## 2013-08-02 DIAGNOSIS — I872 Venous insufficiency (chronic) (peripheral): Secondary | ICD-10-CM

## 2013-08-02 DIAGNOSIS — Z7901 Long term (current) use of anticoagulants: Secondary | ICD-10-CM | POA: Insufficient documentation

## 2013-08-02 DIAGNOSIS — I82409 Acute embolism and thrombosis of unspecified deep veins of unspecified lower extremity: Secondary | ICD-10-CM | POA: Insufficient documentation

## 2013-08-02 DIAGNOSIS — Z79899 Other long term (current) drug therapy: Secondary | ICD-10-CM | POA: Insufficient documentation

## 2013-08-02 DIAGNOSIS — M7989 Other specified soft tissue disorders: Secondary | ICD-10-CM

## 2013-08-02 LAB — POCT CBC
Granulocyte percent: 82.3 %G — AB (ref 37–80)
HCT, POC: 36.9 % — AB (ref 37.7–47.9)
Hemoglobin: 12.4 g/dL (ref 12.2–16.2)
Lymph, poc: 0.8 (ref 0.6–3.4)
MCH, POC: 28 pg (ref 27–31.2)
MCHC: 33.5 g/dL (ref 31.8–35.4)
MCV: 83.5 fL (ref 80–97)
MPV: 7.8 fL (ref 0–99.8)
POC Granulocyte: 6.9 (ref 2–6.9)
POC LYMPH PERCENT: 10.1 %L (ref 10–50)
Platelet Count, POC: 229 10*3/uL (ref 142–424)
RBC: 4.4 M/uL (ref 4.04–5.48)
RDW, POC: 17.1 %
WBC: 8.4 10*3/uL (ref 4.6–10.2)

## 2013-08-02 LAB — CBC WITH DIFFERENTIAL/PLATELET
Basophils Relative: 0 % (ref 0–1)
Eosinophils Absolute: 0.1 10*3/uL (ref 0.0–0.7)
HCT: 38.5 % (ref 36.0–46.0)
Lymphocytes Relative: 14 % (ref 12–46)
MCH: 28.2 pg (ref 26.0–34.0)
MCHC: 33 g/dL (ref 30.0–36.0)
Neutrophils Relative %: 73 % (ref 43–77)
Platelets: 251 10*3/uL (ref 150–400)
WBC: 9.7 10*3/uL (ref 4.0–10.5)

## 2013-08-02 LAB — BASIC METABOLIC PANEL
CO2: 27 mEq/L (ref 19–32)
Calcium: 9.1 mg/dL (ref 8.4–10.5)
Chloride: 98 mEq/L (ref 96–112)
Creatinine, Ser: 2.05 mg/dL — ABNORMAL HIGH (ref 0.50–1.10)
GFR calc Af Amer: 25 mL/min — ABNORMAL LOW (ref >90)
Glucose, Bld: 102 mg/dL — ABNORMAL HIGH (ref 70–99)

## 2013-08-02 LAB — PROTIME-INR
INR: 1.22 (ref 0.00–1.49)
Prothrombin Time: 15.1 seconds (ref 11.6–15.2)

## 2013-08-02 MED ORDER — WARFARIN SODIUM 5 MG PO TABS: ORAL_TABLET | ORAL | Status: DC

## 2013-08-02 MED ORDER — WARFARIN - PHYSICIAN DOSING INPATIENT: Freq: Every day | Status: DC

## 2013-08-02 MED ORDER — WARFARIN SODIUM 5 MG PO TABS
5.0000 mg | ORAL_TABLET | Freq: Once | ORAL | Status: AC
  Administered 2013-08-02: 5 mg via ORAL
  Filled 2013-08-02: qty 1

## 2013-08-02 MED ORDER — ENOXAPARIN SODIUM 100 MG/ML ~~LOC~~ SOLN: SUBCUTANEOUS | Status: DC

## 2013-08-02 MED ORDER — WARFARIN SODIUM 5 MG PO TABS
ORAL_TABLET | ORAL | Status: AC
  Filled 2013-08-02: qty 1

## 2013-08-02 MED ORDER — SULFAMETHOXAZOLE-TMP DS 800-160 MG PO TABS: 1.0000 | ORAL_TABLET | Freq: Two times a day (BID) | ORAL | Status: DC

## 2013-08-02 MED ORDER — ENOXAPARIN SODIUM 80 MG/0.8ML ~~LOC~~ SOLN
1.0000 mg/kg | Freq: Once | SUBCUTANEOUS | Status: AC
  Administered 2013-08-02: 22:00:00 75 mg via SUBCUTANEOUS
  Filled 2013-08-02: qty 0.8

## 2013-08-02 NOTE — ED Notes (Signed)
Sent from radiology for + DVT to left leg, per pt.

## 2013-08-02 NOTE — ED Notes (Signed)
Pt sent from radiology following discovery of DVT.  No distress noted at this time.

## 2013-08-02 NOTE — Telephone Encounter (Signed)
Appt scheduled for 2:00pm today with Dr. Modesto Charon. Caregiver will call back if this doesn't work.

## 2013-08-02 NOTE — ED Provider Notes (Addendum)
CSN: [id number]     Arrival date & time 08/02/13  1839 History   First MD Initiated Contact with Patient 08/02/13 1921     Chief Complaint  Patient presents with  . DVT   (Consider location/radiation/quality/duration/timing/severity/associated sxs/prior Treatment) Patient is a 79 y.o. female presenting with leg pain. The history is provided by the patient (pt has a dvt in her left leg and has some pain).  Leg Pain Lower extremity pain location: left calf. Injury: no   Pain details:    Radiates to:  Does not radiate   Severity:  Moderate   Onset quality:  Gradual   Timing:  Constant Associated symptoms: no back pain and no fatigue     Past Medical History  Diagnosis Date  . GERD (gastroesophageal reflux disease)   . Hypertension   . Overactive bladder    Past Surgical History  Procedure Laterality Date  . Total hip arthroplasty      right  . Total knee arthroplasty      Left  . Abdominal hysterectomy     No family history on file. History  Substance Use Topics  . Smoking status: Former Games developer  . Smokeless tobacco: Not on file  . Alcohol Use: No   OB History   Grav Para Term Preterm Abortions TAB SAB Ect Mult Living                 Review of Systems  Constitutional: Negative for appetite change and fatigue.  HENT: Negative for congestion, ear discharge and sinus pressure.   Eyes: Negative for discharge.  Respiratory: Negative for cough.   Cardiovascular: Negative for chest pain.  Gastrointestinal: Negative for abdominal pain and diarrhea.  Genitourinary: Negative for frequency and hematuria.  Musculoskeletal: Negative for back pain.       Pain in left leg  Skin: Negative for rash.  Neurological: Negative for seizures and headaches.  Psychiatric/Behavioral: Negative for hallucinations.    Allergies  Review of patient's allergies indicates no known allergies.  Home Medications   Current Outpatient Rx  Name  Route  Sig  Dispense  Refill  . aspirin EC 81  MG tablet   Oral   Take 81 mg by mouth at bedtime.         . cloNIDine (CATAPRES) 0.2 MG tablet   Oral   Take 0.2 mg by mouth 3 (three) times daily.         . furosemide (LASIX) 20 MG tablet   Oral   Take 20 mg by mouth 2 (two) times daily.         Marland Kitchen levothyroxine (SYNTHROID, LEVOTHROID) 25 MCG tablet   Oral   Take 25 mcg by mouth every morning.         . Multiple Vitamin (MULTIVITAMIN WITH MINERALS) TABS tablet   Oral   Take 1 tablet by mouth at bedtime.         Marland Kitchen omeprazole (PRILOSEC) 20 MG capsule   Oral   Take 20 mg by mouth 2 (two) times daily.         Marland Kitchen sulfamethoxazole-trimethoprim (BACTRIM DS) 800-160 MG per tablet   Oral   Take 1 tablet by mouth 2 (two) times daily. Course started on 08/02/2013         . enoxaparin (LOVENOX) 100 MG/ML injection      Take .75cc or 75mg  sub q for 4 days   20 mL   0   . warfarin (COUMADIN) 5 MG tablet  Take one 5mg  pill a day   30 tablet   0    BP 189/49  Pulse 53  Temp(Src) 97.4 F (36.3 C) (Oral)  Resp 18  Ht 5\' 4"  (1.626 m)  Wt 160 lb (72.576 kg)  BMI 27.45 kg/m2  SpO2 96% Physical Exam  Constitutional: She is oriented to person, place, and time. She appears well-developed.  HENT:  Head: Normocephalic.  Eyes: Conjunctivae are normal.  Neck: No tracheal deviation present.  Cardiovascular:  No murmur heard. Musculoskeletal: Normal range of motion.  Mild tender left calf  Neurological: She is oriented to person, place, and time.  Skin: Skin is warm.  Psychiatric: She has a normal mood and affect.    ED Course  Procedures (including critical care time) Labs Review Labs Reviewed  BASIC METABOLIC PANEL - Abnormal; Notable for the following:    Glucose, Bld 102 (*)    BUN 50 (*)    Creatinine, Ser 2.05 (*)    GFR calc non Af Amer 21 (*)    GFR calc Af Amer 25 (*)    All other components within normal limits  CBC WITH DIFFERENTIAL - Abnormal; Notable for the following:    RDW 17.2 (*)     Monocytes Absolute 1.1 (*)    All other components within normal limits  PROTIME-INR   Imaging Review Dg Tibia/fibula Left  08/02/2013   CLINICAL DATA:  Recurring cellulitis in the leg. Evaluate for underlying osteomyelitis.  EXAM: LEFT TIBIA AND FIBULA - 2 VIEW  COMPARISON:  No priors.  FINDINGS: AP and lateral views of the left tibia demonstrate postoperative changes of left total knee arthroplasty. The tibial component of the prosthesis appears properly seated without definite periprosthetic fracture or other acute abnormalities. No areas of osteolysis are identified within the tibia or fibula to suggest underlying osteomyelitis. No acute displaced fracture of either bone is identified. Soft tissues appear diffusely swollen.  IMPRESSION: 1. No definite evidence to suggest osteomyelitis on this plain film examination. 2. Diffuse soft tissue swelling. 3. Status post left total knee arthroplasty.   Electronically Signed   By: Trudie Reed M.D.   On: 08/02/2013 15:29   US Venous Img Lower Bilateral  08/02/2013   *RADIOLOGY REPORT*  Clinical Data: Left worse than right recurrent leg swelling  BILATERAL LOWER EXTREMITY VENOUS DUPLEX ULTRASOUND  Technique:  Gray-scale sonography with graded compression, as well as color Doppler and duplex ultrasound, were performed to evaluate the deep venous system of both lower extremities from the level of the common femoral vein through the popliteal and proximal calf veins.  Spectral Doppler was utilized to evaluate flow at rest and with distal augmentation maneuvers.  Comparison:  None.  Findings:  RIGHT: Normal compressibility of common femoral, superficial femoral, and popliteal veins is demonstrated, as well as the visualized proximal calf veins.  No filling defects to suggest DVT on grayscale or color Doppler imaging.  Doppler waveforms show normal direction of venous flow, normal respiratory phasicity and response to augmentation.  LEFT: Abnormal appearance of  the left common femoral vein.  There is echogenic material versus asymmetric wall thickening along the deep wall of the vessel and the vessel is incompletely compressible.  The echogenic material extends throughout the common femoral vein.  On color Doppler imaging, there are areas of flow suggesting that this is a nonocclusive process.  The saphenofemoral junction great saphenous vein are patent and compressible.  The most proximal femoral vein demonstrates some peripheral wall thickening is  incompletely compressible.  The profunda femoral vein is patent.  The femoral vein in the mid and distal thigh are patent and compressible as is the popliteal vein and visualized calf veins.  Subcutaneous edema is noted in the calf soft tissues.  IMPRESSION:  1.  Chronic recanalized DVT versus acute nonocclusive DVT in the left common and femoral veins in the upper thigh.  While the sonographic features are most suggestive of chronic recanalized DVT, a superimposed acute nonocclusive component is difficult to exclude entirely in the setting of acute symptoms.  2.  Negative for acute DVT in the right lower extremity.  These results were called by telephone on 08/02/2013 at approximately 7:00 p.m. to Dr. Vernon Prey, who verbally acknowledged these results. However, in the interim the patient had been sent to the emergency room for further evaluation.  Signed,  Sterling Big, MD [facility]   Original Report Authenticated By: Malachy Moan, M.D.    MDM  Will tx with lovenox and coumadin 1. DVT (deep venous thrombosis), left       Benny Lennert, MD 08/02/13 9528  Benny Lennert, MD 08/10/13 [phone]

## 2013-08-02 NOTE — Telephone Encounter (Signed)
DAUGHTER  F/U APPT 10/8 W/ WILLIAM OXFORD. RWS

## 2013-08-02 NOTE — Progress Notes (Signed)
Patient ID: Tracy Walker, female   DOB: 12-11-1927, 79 y.o.   MRN: [id number] SUBJECTIVE: CC: Chief Complaint  Patient presents with  . Acute Visit    saw bill last week         c/o leg infection   red and left leg swollen stataes 81yrs ago had fractured left leg    HPI: Has swelling and tenderness and red rash left leg recurrent. Has had a fracture of this leg 4 years ago when she fell.  No fever.. The right leg also swells a little. Left leg started to swell 2 days ago again.and is sore. The right leg is also a little swollen.   Past Medical History  Diagnosis Date  . GERD (gastroesophageal reflux disease)   . Hypertension   . Overactive bladder    Past Surgical History  Procedure Laterality Date  . Total hip arthroplasty      right  . Total knee arthroplasty      Left  . Abdominal hysterectomy     History   Social History  . Marital Status: Widowed    Spouse Name: N/A    Number of Children: N/A  . Years of Education: N/A   Occupational History  . Not on file.   Social History Main Topics  . Smoking status: Former Games developer  . Smokeless tobacco: Not on file  . Alcohol Use: No  . Drug Use: No  . Sexual Activity: Not on file   Other Topics Concern  . Not on file   Social History Narrative  . No narrative on file   No family history on file. Current Outpatient Prescriptions on File Prior to Visit  Medication Sig Dispense Refill  . cloNIDine (CATAPRES) 0.2 MG tablet One po tid  90 tablet  5  . furosemide (LASIX) 20 MG tablet Take 1 tablet (20 mg total) by mouth 2 (two) times daily.  60 tablet  5  . omeprazole (PRILOSEC) 20 MG capsule Take 1 capsule (20 mg total) by mouth 2 (two) times daily.  60 capsule  3  . tolterodine (DETROL LA) 4 MG 24 hr capsule Take 1 capsule (4 mg total) by mouth daily.  30 capsule  6  . levothyroxine (LEVOTHROID) 25 MCG tablet Take 1 tablet (25 mcg total) by mouth daily before breakfast.  30 tablet  5   No current  facility-administered medications on file prior to visit.   No Known Allergies Immunization History  Administered Date(s) Administered  . Influenza,inj,Quad PF,36+ Mos 07/28/2013   Prior to Admission medications   Medication Sig Start Date End Date Taking? Authorizing Provider  cloNIDine (CATAPRES) 0.2 MG tablet One po tid 07/21/13  Yes Deatra Canter, FNP  furosemide (LASIX) 20 MG tablet Take 1 tablet (20 mg total) by mouth 2 (two) times daily. 07/21/13  Yes Deatra Canter, FNP  omeprazole (PRILOSEC) 20 MG capsule Take 1 capsule (20 mg total) by mouth 2 (two) times daily. 07/02/13  Yes Mae Shelda Altes, FNP  tolterodine (DETROL LA) 4 MG 24 hr capsule Take 1 capsule (4 mg total) by mouth daily. 07/11/13  Yes Mae Shelda Altes, FNP  levothyroxine (LEVOTHROID) 25 MCG tablet Take 1 tablet (25 mcg total) by mouth daily before breakfast. 07/30/13   Deatra Canter, FNP    ROS: As above in the HPI. All other systems are stable or negative.  OBJECTIVE: APPEARANCE:  Patient in no acute distress.The patient appeared well nourished and normally developed. Acyanotic. Waist: VITAL  SIGNS:BP 158/66  Pulse 60  Temp(Src) 97.9 F (36.6 C) (Oral)  Wt 167 lb (75.751 kg)  BMI 29.59 kg/m2 WF. Ambulates with a rolling walker.  SKIN: warm and  Dry without overt rashes, tattoos and scars  HEAD and Neck: without JVD, Head and scalp: normal Eyes:No scleral icterus. Fundi normal, eye movements normal. Ears: Auricle normal, canal normal, Tympanic membranes normal, insufflation normal. Nose: normal Throat: normal Neck & thyroid: normal  CHEST & LUNGS: Chest wall: normal Lungs: Clear  CVS: Reveals the PMI to be normally located. Regular rhythm, First and Second Heart sounds are normal,  absence of murmurs, rubs or gallops. Peripheral vasculature: Radial pulses: normal Dorsal pedis pulses: normal Posterior pulses: normal  ABDOMEN:  Appearance: overweight. Benign, no organomegaly, no masses,  no Abdominal Aortic enlargement. No Guarding , no rebound. No Bruits. Bowel sounds: normal  RECTAL: N/A GU: N/A  EXTREMETIES:  3+edematous left leg wit redness and warmth and tenderness.  Right leg 1 + edema with mild redness, non tender. Suspect stasis dermatitis with cellulitis of the left leg.   NEUROLOGIC: oriented to time,place and person; nonfocal. Gait unsteady but stable with the walker.  ASSESSMENT: Venous stasis of lower extremity  Cellulitis and abscess of leg - mainly left - Plan: POCT CBC, DG Tibia/Fibula Left, sulfamethoxazole-trimethoprim (BACTRIM DS) 800-160 MG per tablet  GERD (gastroesophageal reflux disease)  HTN (hypertension)  Leg swelling - Plan: US Venous Img Lower Bilateral    PLAN:  [facility] reading (PRIMARY) by  Dr. Modesto Charon: knee prosthetic in plate from old proximal tibial plate fracture. No osteomyelitis nidus visible.                             Results for orders placed in visit on 07/28/13  CMP14+EGFR      Result Value Range   Glucose 95  65 - 99 mg/dL   BUN 41 (*) 8 - 27 mg/dL   Creatinine, Ser 0.98 (*) 0.57 - 1.00 mg/dL   GFR calc non Af Amer 19 (*) >59 mL/min/1.73   GFR calc Af Amer 22 (*) >59 mL/min/1.73   BUN/Creatinine Ratio 18  11 - 26   Sodium 142  134 - 144 mmol/L   Potassium 5.3 (*) 3.5 - 5.2 mmol/L   Chloride 101  97 - 108 mmol/L   CO2 24  18 - 29 mmol/L   Calcium 8.8  8.6 - 10.2 mg/dL   Total Protein 6.8  6.0 - 8.5 g/dL   Albumin 4.0  3.5 - 4.7 g/dL   Globulin, Total 2.8  1.5 - 4.5 g/dL   Albumin/Globulin Ratio 1.4  1.1 - 2.5   Total Bilirubin 0.2  0.0 - 1.2 mg/dL   Alkaline Phosphatase 67  39 - 117 IU/L   AST 16  0 - 40 IU/L   ALT 12  0 - 32 IU/L  THYROID PANEL WITH TSH      Result Value Range   TSH 9.880 (*) 0.450 - 4.500 uIU/mL   COMMENT Comment     T4, Total 5.9  4.5 - 12.0 ug/dL   T3 Uptake Ratio 34  24 - 39 %   Free Thyroxine Index 2.0  1.2 - 4.9  POCT CBC      Result Value Range   WBC 7.7  4.6 - 10.2 K/uL    Lymph, poc 1.6  0.6 - 3.4   POC LYMPH PERCENT 20.9  10 - 50 %L  POC Granulocyte 5.3  2 - 6.9   Granulocyte percent 69.4  37 - 80 %G   RBC 4.3  4.04 - 5.48 M/uL   Hemoglobin 11.9 (*) 12.2 - 16.2 g/dL   HCT, POC 40.9 (*) 81.1 - 47.9 %   MCV 83.6  80 - 97 fL   MCH, POC 27.6  27 - 31.2 pg   MCHC 33.0  31.8 - 35.4 g/dL   RDW, POC 91.4     Platelet Count, POC 283.0  142 - 424 K/uL   MPV 7.4  0 - 99.8 fL   Orders Placed This Encounter  Procedures  . DG Tibia/Fibula Left    Standing Status: Future     Number of Occurrences: 1     Standing Expiration Date: 10/02/2014    Order Specific Question:  Reason for Exam (SYMPTOM  OR DIAGNOSIS REQUIRED)    Answer:  old fracture, recurring cellulitis, r/o osteomyelitis    Order Specific Question:  Preferred imaging location?    Answer:  Internal  . US Venous Img Lower Bilateral    Standing Status: Future     Number of Occurrences:      Standing Expiration Date: 10/02/2014    Order Specific Question:  Reason for Exam (SYMPTOM  OR DIAGNOSIS REQUIRED)    Answer:  leg swelling recurrent with redness and warmth. left worse than right. past history of left leg fracture. r/o DVT    Order Specific Question:  Preferred imaging location?    Answer:  [facility]  . POCT CBC    Await U/s to R/O DVT. If no DVT , then her problems with her legs is due to significant venous incompetence with stasis dermatitis and will need Unna Boots initially and compression thigh highs after.  Return if symptoms worsen or fail to improve. And pending U/S  Tonny Isensee P. Modesto Charon, M.D.

## 2013-08-04 ENCOUNTER — Ambulatory Visit: Payer: Medicare Other | Admitting: Family Medicine

## 2013-08-09 ENCOUNTER — Ambulatory Visit (INDEPENDENT_AMBULATORY_CARE_PROVIDER_SITE_OTHER): Payer: Medicare Other | Admitting: Family Medicine

## 2013-08-09 ENCOUNTER — Encounter: Payer: Self-pay | Admitting: Family Medicine

## 2013-08-09 VITALS — BP 157/79 | HR 105 | Temp 98.0°F | Ht 64.0 in | Wt 160.0 lb

## 2013-08-09 DIAGNOSIS — R21 Rash and other nonspecific skin eruption: Secondary | ICD-10-CM

## 2013-08-09 DIAGNOSIS — I82409 Acute embolism and thrombosis of unspecified deep veins of unspecified lower extremity: Secondary | ICD-10-CM

## 2013-08-09 MED ORDER — NYSTATIN-TRIAMCINOLONE 100000-0.1 UNIT/GM-% EX OINT: TOPICAL_OINTMENT | Freq: Two times a day (BID) | CUTANEOUS | Status: DC

## 2013-08-09 NOTE — Progress Notes (Signed)
  Subjective:    Patient ID: Tracy Walker, female    DOB: 1928-01-18, 79 y.o.   MRN: [id number]  HPI  This 79 y.o. female presents for evaluation of cellulitis of left lower extremity.  She was seen a week ago and went to ED where she was Found to have a Left femoral DVT.  She was started on coumadin and also given lovenox.  She is off lovenox and is here to get ptinr.  She Fell 3 days ago and c/o hematoma, contusion to head, left arm, pain in her legs and left chest.  She was not using her walker when she fell. She has hx of DVT and in 2008 she had IVC placed prior to hip surgery.  Review of Systems C/o recent fall, contusions, bruising, DVT, rash and cellulitis of lower extremity No chest pain, SOB, HA, dizziness, vision change, N/V, diarrhea, constipation, dysuria, urinary urgency or frequency, myalgias, arthralgias or rash.     Objective:   Physical Exam  Vital signs noted  Well developed well nourished female.  HEENT - Head with swelling and scab/clot left parietal region with TTP.                Eyes - PERRLA, Conjuctiva - clear Sclera- Clear EOMI                Ears - EAC's Wnl TM's Wnl Gross Hearing WNL                Nose - Nares patent                 Throat - oropharanx wnl Respiratory - Lungs CTA bilateral Cardiac - RRR S1 and S2 without murmur GI - Abdomen soft Nontender and bowel sounds active x 4 Extremities - Mild swelling and erythema left lower extremity.  Ecchymosis to left wrist. MS - TTP left lateral chest wall. Neuro - Grossly intact. Skin - Bruising left lower abdomen.     Assessment & Plan:  DVT (deep venous thrombosis), unspecified laterality - Plan: POCT INR, nystatin-triamcinolone ointment (MYCOLOG), CANCELED: Ambulatory referral to Anticoagulation Monitoring, CANCELED: POCT INR.  DC coumadin due to poor candidate and take ASA 81mg  po qd.  She has IVC filter and this should suffice. She has hx of falls.  Rash and nonspecific skin eruption - Plan:  nystatin-triamcinolone ointment (MYCOLOG)  Scalp contusion - Advised to apply heating pad and take tylenol prn discomfort.  Falls - Discussed with patient she needs to use walker and DC coumadin.  Deatra Canter FNP

## 2013-08-09 NOTE — Patient Instructions (Signed)
Deep Vein Thrombosis A deep vein thrombosis (DVT) is a blood clot that develops in a deep vein. A DVT is a clot in the deep, larger veins of the leg, arm, or pelvis. These are more dangerous than clots that might form in veins near the surface of the body. A DVT can lead to complications if the clot breaks off and travels in the bloodstream to the lungs.  A DVT can damage the valves in your leg veins, so that instead of flowing upwards, the blood pools in the lower leg. This is called post-thrombotic syndrome, and can result in pain, swelling, discoloration, and sores on the leg. Once identified, a DVT can be treated. It can also be prevented in some circumstances. Once you have had a DVT, you may be at increased risk for a DVT in the future. CAUSES Blood clots form in a vein for different reasons. Usually several things contribute to blood clots. Contributing factors include:  The flow of blood slows down.  The inside of the vein is damaged in some way.  The person has a condition that makes blood clot more easily. Some people are more likely than others to develop blood clots. That is because they have more factors that make clots likely. These are called risk factors. Risk factors include:   Older age, especially over 75 years old.  Having a history of blood clots. This means you have had one before. Or, it means that someone else in your family has had blood clots. You may have a genetic tendency to form clots.  Having major or lengthy surgery. This is especially true for surgery on the hip, knee, or belly (abdomen). Hip surgery is particularly high risk.  Breaking a hip or leg.  Sitting or lying still for a long time. This includes long distance travel, paralysis, or recovery from an illness or surgery.  Cancer, or cancer treatment.  Having a long, thin tube (catheter) placed inside a vein during a medical procedure.  Being overweight (obese).  Pregnancy and childbirth. Hormone  changes make the blood clot more easily during pregnancy. The fetus puts pressure on the veins of the pelvis. There is also risk of injury to veins during delivery or a caesarean. The risk is at its highest just after childbirth.  Medicines with the female hormone estrogen. This includes birth control pills and hormone replacement therapy.  Smoking.  Other circulation or heart problems. SYMPTOMS When a clot forms, it can either partially or totally block the blood flow in that vein. Symptoms of a DVT can include:  Swelling of the leg or arm, especially if one side is much worse.  Warmth and redness of the leg or arm, especially if one side is much worse.  Pain in an arm or leg. If the clot is in the leg, symptoms may be more noticeable or worse when standing or walking. The symptoms of a DVT that has traveled to the lungs (pulmonary embolism, PE) usually start suddenly, and include:  Shortness of breath.  Coughing.  Coughing up blood or blood-tinged phlegm.  Chest pain. The chest pain is often worse with deep breaths.  Rapid heartbeat. Anyone with these symptoms should get emergency medical treatment right away. Call your local emergency services (911 in [place] if you have these symptoms. DIAGNOSIS If a DVT is suspected, your caregiver will take a full medical history and carry out a physical exam. Tests that also may be required include:  Blood tests, including studies of   the clotting properties of the blood.  Ultrasonography to see if you have clots in your legs or lungs.  X-rays to show the flow of blood when dye is injected into the veins (venography).  Studies of your lungs, if you have any chest symptoms. PREVENTION  Exercise the legs regularly. Take a brisk 30 minute walk every day.  Maintain a weight that is appropriate for your height.  Avoid sitting or lying in bed for long periods of time without moving your legs.  Women, particularly those over the age of 35,  should consider the risks and benefits of taking estrogen medicines, including birth control pills.  Do not smoke, especially if you take estrogen medicines.  Long distance travel can increase your risk of DVT. You should exercise your legs by walking or pumping the muscles every hour.  In-hospital prevention:  Many of the risk factors above relate to situations that exist with hospitalization, either for illness, injury, or elective surgery.  Your caregiver will assess you for the need for venous thromboembolism prophylaxis when you are admitted to the hospital. If you are having surgery, your surgeon will assess you the day of or day after surgery.  Prevention may include medical and nonmedical measures. TREATMENT Treatment for DVT helps prevent death and disability. The most common treatment for DVT is blood thinning (anticoagulant) medicine, which reduces the blood's tendency to clot. Anticoagulants can stop new blood clots from forming and old ones from growing. They cannot dissolve existing clots. Your body does this by itself over time. Anticoagulants can be given by mouth, by intravenous (IV) access, or by injection. Your caregiver will determine the best program for you.  Heparin or related medicines (low molecular weight heparin) are usually the first treatment for a blood clot. They act quickly. However, they cannot be taken orally.  Heparin can cause a fall in a component of blood that stops bleeding and forms blood clots (platelets). You will be monitored with blood tests to be sure this does not occur.  Warfarin is an anticoagulant that can be swallowed (taken orally). It takes a few days to start working, so usually heparin or related medicines are used in combination. Once warfarin is working, heparin is usually stopped.  Less commonly, clot dissolving drugs (thrombolytics) are used to dissolve a DVT. They carry a high risk of bleeding, so they are used mainly in severe cases,  where a life or limb is threatened.  Very rarely, a blood clot in the leg needs to be removed surgically.  If you are unable to take anticoagulants, your caregiver may arrange for you to have a filter placed in a main vein in your belly (abdomen). This filter prevents clots from traveling to your lungs. HOME CARE INSTRUCTIONS  Take all medicines prescribed by your caregiver. Follow the directions carefully.  Warfarin. Most people will continue taking warfarin after hospital discharge. Your caregiver will advise you on the length of treatment (usually 3 6 months, sometimes lifelong).  Too much and too little warfarin are both dangerous. Too much warfarin increases the risk of bleeding. Too little warfarin continues to allow the risk for blood clots. While taking warfarin, you will need to have regular blood tests to measure your blood clotting time. These blood tests usually include both the prothrombin time (PT) and international normalized ratio (INR) tests. The PT and INR results allow your caregiver to adjust your dose of warfarin. The dose can change for many reasons. It is critically important that   you take warfarin exactly as prescribed, and that you have your PT and INR levels drawn exactly as directed.  Many foods, especially foods high in vitamin K can interfere with warfarin and affect the PT and INR results. Foods high in vitamin K include spinach, kale, broccoli, cabbage, collard and turnip greens, brussels sprouts, peas, cauliflower, seaweed, and parsley as well as beef and pork liver, green tea, and soybean oil. You should eat a consistent amount of foods high in vitamin K. Avoid major changes in your diet, or notify your caregiver before changing your diet. Arrange a visit with a dietitian to answer your questions.  Many medicines can interfere with warfarin and affect the PT and INR results. You must tell your caregiver about any and all medicines you take, this includes all vitamins  and supplements. Be especially cautious with aspirin and anti-inflammatory medicines. Ask your caregiver before taking these. Do not take or discontinue any prescribed or over-the-counter medicine except on the advice of your caregiver or pharmacist.  Warfarin can have side effects, primarily excessive bruising or bleeding. You will need to hold pressure over cuts for longer than usual. Your caregiver or pharmacist will discuss other potential side effects.  Alcohol can change the body's ability to handle warfarin. It is best to avoid alcoholic drinks or consume only very small amounts while taking warfarin. Notify your caregiver if you change your alcohol intake.  Notify your dentist or other caregivers before procedures.  Activity. Ask your caregiver how soon you can go back to normal activities. It is important to stay active to prevent blood clots. If you are on anticoagulant medicine, avoid contact sports.  Exercise. It is very important to exercise. This is especially important while traveling, sitting or standing for long periods of time. Exercise your legs by walking or by pumping the muscles frequently. Take frequent walks.  Compression stockings. These are tight elastic stockings that apply pressure to the lower legs. This pressure can help keep the blood in the legs from clotting. You may need to wear compressions stockings at home to help prevent a DVT.  Smoking. If you smoke, quit. Ask your caregiver for help with quitting smoking.  Learn as much as you can about DVT. Knowing more about the condition should help you keep it from coming back.  Wear a medical alert bracelet or carry a medical alert card. SEEK MEDICAL CARE IF:  You notice a rapid heartbeat.  You feel weaker or more tired than usual.  You feel faint.  You notice increased bruising.  You feel your symptoms are not getting better in the time expected.  You believe you are having side effects of medicine. SEEK  IMMEDIATE MEDICAL CARE IF:  You have chest pain.  You have trouble breathing.  You have new or increased swelling or pain in one leg.  You cough up blood.  You notice blood in vomit, in a bowel movement, or in urine. MAKE SURE YOU:  Understand these instructions.  Will watch your condition.  Will get help right away if you are not doing well or get worse. Document Released: 10/14/2005 Document Revised: 07/08/2012 Document Reviewed: 12/06/2010 Exit[employer] Patient Information 2014 [employer].  

## 2013-08-16 ENCOUNTER — Telehealth: Payer: Self-pay | Admitting: Family Medicine

## 2013-08-17 NOTE — Telephone Encounter (Signed)
Could the BP med or thyroid med be causing weakness and lack of energy. She doesn't seem to want to go anymore.  Please advise.  Does she need to come in or change medicine doses?

## 2013-08-18 NOTE — Telephone Encounter (Signed)
Pt aware to schedule appt with Bill O for the fatigue.

## 2013-08-18 NOTE — Telephone Encounter (Signed)
She should come in get UA checked and get bp checked and seen.  Her thyroid medicine is not causing this.

## 2013-08-23 ENCOUNTER — Ambulatory Visit (INDEPENDENT_AMBULATORY_CARE_PROVIDER_SITE_OTHER): Payer: Medicare Other | Admitting: Family Medicine

## 2013-08-23 ENCOUNTER — Encounter (INDEPENDENT_AMBULATORY_CARE_PROVIDER_SITE_OTHER): Payer: Self-pay

## 2013-08-23 ENCOUNTER — Other Ambulatory Visit: Payer: Self-pay | Admitting: Family Medicine

## 2013-08-23 VITALS — BP 189/84 | HR 112 | Temp 98.0°F | Ht 64.0 in | Wt 156.6 lb

## 2013-08-23 DIAGNOSIS — I82402 Acute embolism and thrombosis of unspecified deep veins of left lower extremity: Secondary | ICD-10-CM

## 2013-08-23 DIAGNOSIS — N184 Chronic kidney disease, stage 4 (severe): Secondary | ICD-10-CM

## 2013-08-23 DIAGNOSIS — R5381 Other malaise: Secondary | ICD-10-CM

## 2013-08-23 DIAGNOSIS — N390 Urinary tract infection, site not specified: Secondary | ICD-10-CM

## 2013-08-23 DIAGNOSIS — N189 Chronic kidney disease, unspecified: Secondary | ICD-10-CM

## 2013-08-23 DIAGNOSIS — E039 Hypothyroidism, unspecified: Secondary | ICD-10-CM

## 2013-08-23 DIAGNOSIS — E875 Hyperkalemia: Secondary | ICD-10-CM

## 2013-08-23 DIAGNOSIS — R531 Weakness: Secondary | ICD-10-CM

## 2013-08-23 DIAGNOSIS — I10 Essential (primary) hypertension: Secondary | ICD-10-CM

## 2013-08-23 LAB — POCT CBC
Granulocyte percent: 78.1 %G (ref 37–80)
HCT, POC: 37.4 % — AB (ref 37.7–47.9)
Hemoglobin: 12 g/dL — AB (ref 12.2–16.2)
Lymph, poc: 1.2 (ref 0.6–3.4)
MCH, POC: 26.9 pg — AB (ref 27–31.2)
MCHC: 32.1 g/dL (ref 31.8–35.4)
MCV: 83.6 fL (ref 80–97)
MPV: 6.9 fL (ref 0–99.8)
POC Granulocyte: 5.2 (ref 2–6.9)
POC LYMPH PERCENT: 18.8 %L (ref 10–50)
Platelet Count, POC: 407 10*3/uL (ref 142–424)
RBC: 4.5 M/uL (ref 4.04–5.48)
RDW, POC: 19.1 %
WBC: 6.6 10*3/uL (ref 4.6–10.2)

## 2013-08-23 LAB — POCT URINALYSIS DIPSTICK
Bilirubin, UA: NEGATIVE
Blood, UA: NEGATIVE
Glucose, UA: NEGATIVE
Ketones, UA: NEGATIVE
Nitrite, UA: POSITIVE
Protein, UA: NEGATIVE
Spec Grav, UA: 1.01
Urobilinogen, UA: NEGATIVE
pH, UA: 5

## 2013-08-23 LAB — POCT UA - MICROSCOPIC ONLY
Casts, Ur, LPF, POC: NEGATIVE
Crystals, Ur, HPF, POC: NEGATIVE
Mucus, UA: NEGATIVE
RBC, urine, microscopic: NEGATIVE
Yeast, UA: NEGATIVE

## 2013-08-23 MED ORDER — CIPROFLOXACIN HCL 250 MG PO TABS: 250.0000 mg | ORAL_TABLET | Freq: Two times a day (BID) | ORAL | Status: DC

## 2013-08-23 NOTE — Patient Instructions (Signed)
Urinary Tract Infection  Urinary tract infections (UTIs) can develop anywhere along your urinary tract. Your urinary tract is your body's drainage system for removing wastes and extra water. Your urinary tract includes two kidneys, two ureters, a bladder, and a urethra. Your kidneys are a pair of bean-shaped organs. Each kidney is about the size of your fist. They are located below your ribs, one on each side of your spine.  CAUSES  Infections are caused by microbes, which are microscopic organisms, including fungi, viruses, and bacteria. These organisms are so small that they can only be seen through a microscope. Bacteria are the microbes that most commonly cause UTIs.  SYMPTOMS   Symptoms of UTIs may vary by age and gender of the patient and by the location of the infection. Symptoms in young women typically include a frequent and intense urge to urinate and a painful, burning feeling in the bladder or urethra during urination. Older women and men are more likely to be tired, shaky, and weak and have muscle aches and abdominal pain. A fever may mean the infection is in your kidneys. Other symptoms of a kidney infection include pain in your back or sides below the ribs, nausea, and vomiting.  DIAGNOSIS  To diagnose a UTI, your caregiver will ask you about your symptoms. Your caregiver also will ask to provide a urine sample. The urine sample will be tested for bacteria and white blood cells. White blood cells are made by your body to help fight infection.  TREATMENT   Typically, UTIs can be treated with medication. Because most UTIs are caused by a bacterial infection, they usually can be treated with the use of antibiotics. The choice of antibiotic and length of treatment depend on your symptoms and the type of bacteria causing your infection.  HOME CARE INSTRUCTIONS   If you were prescribed antibiotics, take them exactly as your caregiver instructs you. Finish the medication even if you feel better after you  have only taken some of the medication.   Drink enough water and fluids to keep your urine clear or pale yellow.   Avoid caffeine, tea, and carbonated beverages. They tend to irritate your bladder.   Empty your bladder often. Avoid holding urine for long periods of time.   Empty your bladder before and after sexual intercourse.   After a bowel movement, women should cleanse from front to back. Use each tissue only once.  SEEK MEDICAL CARE IF:    You have back pain.   You develop a fever.   Your symptoms do not begin to resolve within 3 days.  SEEK IMMEDIATE MEDICAL CARE IF:    You have severe back pain or lower abdominal pain.   You develop chills.   You have nausea or vomiting.   You have continued burning or discomfort with urination.  MAKE SURE YOU:    Understand these instructions.   Will watch your condition.   Will get help right away if you are not doing well or get worse.  Document Released: 07/24/2005 Document Revised: 04/14/2012 Document Reviewed: 11/22/2011  Exit[employer] Patient Information 2014 [employer].

## 2013-08-23 NOTE — Progress Notes (Signed)
Subjective:    Patient ID: Tracy Walker, female    DOB: September 30, 1928, 79 y.o.   MRN: [id number]  HPI This 79 y.o. female presents for evaluation of weakness and fatigue.  She has been feeling Tired and listless for the past few weeks.  She has hx of hypertension and DVT.  She was seen a few Weeks ago when she developed the DVT and in the interim of her ER visit and visit here at the clinic She fell and had a hematoma on her scalp so the coumadin was DC'd.  She has had difficulties with Her BP and was having extremely elevated bp readings of 220 / 120 and had her lasix increased to 40mg  po bid and her clonidine increased which lowered her bp down to normal range. She had follow  Up labs on 10/10 showing declining renal fx's and elevated K so her potassium was decreased back To 20mg  po bid.  She has not had follow up labs since..   Review of Systems C/o weakness No chest pain, SOB, HA, dizziness, vision change, N/V, diarrhea, constipation, dysuria, urinary urgency or frequency, myalgias, arthralgias or rash.     Objective:   Physical Exam Vital signs noted  Chronically ill appearing female in NAD.  HEENT - Head atraumatic Normocephalic                Eyes - PERRLA, Conjuctiva - clear Sclera- Clear EOMI                Ears - EAC's Wnl TM's Wnl Gross Hearing WNL                Nose - Nares patent                 Throat - oropharanx wnl Respiratory - Lungs CTA bilateral Cardiac - RRR S1 and S2 without murmur GI - Abdomen soft Nontender and bowel sounds active x 4 Extremities - No edema. Neuro - Grossly intact.   Results for orders placed in visit on 08/23/13  POCT CBC      Result Value Range   WBC 6.6  4.6 - 10.2 K/uL   Lymph, poc 1.2  0.6 - 3.4   POC LYMPH PERCENT 18.8  10 - 50 %L   MID (cbc)    0 - 0.9   POC MID %    0 - 12 %M   POC Granulocyte 5.2  2 - 6.9   Granulocyte percent 78.1  37 - 80 %G   RBC 4.5  4.04 - 5.48 M/uL   Hemoglobin 12.0 (*) 12.2 - 16.2 g/dL   HCT,  POC 40.9 (*) 81.1 - 47.9 %   MCV 83.6  80 - 97 fL   MCH, POC 26.9 (*) 27 - 31.2 pg   MCHC 32.1  31.8 - 35.4 g/dL   RDW, POC 91.4     Platelet Count, POC 407.0  142 - 424 K/uL   MPV 6.9  0 - 99.8 fL  POCT URINALYSIS DIPSTICK      Result Value Range   Color, UA yellow     Clarity, UA cloudy     Glucose, UA neg     Bilirubin, UA neg     Ketones, UA neg     Spec Grav, UA 1.010     Blood, UA neg     pH, UA 5.0     Protein, UA neg     Urobilinogen, UA negative  Nitrite, UA pos     Leukocytes, UA Trace    POCT UA - MICROSCOPIC ONLY      Result Value Range   WBC, Ur, HPF, POC 15-20     RBC, urine, microscopic neg     Bacteria, U Microscopic many     Mucus, UA neg     Epithelial cells, urine per micros occ     Crystals, Ur, HPF, POC neg     Casts, Ur, LPF, POC neg     Yeast, UA neg        Assessment & Plan:  Weakness - Plan: POCT CBC, POCT urinalysis dipstick, POCT UA - Microscopic Only, CMP14+EGFR, TSH, Thyroid Panel With TSH. GET STAT renal fx and K  Unspecified essential hypertension - Plan: CMP14+EGFR Her bp is elevated  CKD (chronic kidney disease) - Plan: CMP14+EGFR  Hyperkalemia - Plan: CMP14+EGFR get stat K  Unspecified hypothyroidism - Plan: Thyroid Panel With TSH  UTI - Cipro 500mg  one po bid x 10 days  Deatra Canter FNP

## 2013-08-24 LAB — THYROID PANEL WITH TSH
Free Thyroxine Index: 2.1 (ref 1.2–4.9)
Free Thyroxine Index: 2.1 (ref 1.2–4.9)
T3 Uptake Ratio: 31 % (ref 24–39)
T3 Uptake Ratio: 31 % (ref 24–39)
T4, Total: 6.9 ug/dL (ref 4.5–12.0)
T4, Total: 6.9 ug/dL (ref 4.5–12.0)
TSH: 6.12 u[IU]/mL — ABNORMAL HIGH (ref 0.450–4.500)
TSH: 5.72 u[IU]/mL — ABNORMAL HIGH (ref 0.450–4.500)

## 2013-08-24 LAB — CMP14+EGFR
ALT: 13 IU/L (ref 0–32)
AST: 19 IU/L (ref 0–40)
Albumin/Globulin Ratio: 1.4 (ref 1.1–2.5)
Albumin: 4.2 g/dL (ref 3.5–4.7)
Alkaline Phosphatase: 74 IU/L (ref 39–117)
BUN/Creatinine Ratio: 17 (ref 11–26)
BUN: 31 mg/dL — ABNORMAL HIGH (ref 8–27)
CO2: 23 mmol/L (ref 18–29)
Calcium: 10.1 mg/dL (ref 8.6–10.2)
Chloride: 103 mmol/L (ref 97–108)
Creatinine, Ser: 1.78 mg/dL — ABNORMAL HIGH (ref 0.57–1.00)
GFR calc Af Amer: 30 mL/min/{1.73_m2} — ABNORMAL LOW (ref >59)
GFR calc non Af Amer: 26 mL/min/{1.73_m2} — ABNORMAL LOW (ref >59)
Globulin, Total: 2.9 g/dL (ref 1.5–4.5)
Glucose: 124 mg/dL — ABNORMAL HIGH (ref 65–99)
Potassium: 4.4 mmol/L (ref 3.5–5.2)
Sodium: 142 mmol/L (ref 134–144)
Total Bilirubin: 0.2 mg/dL (ref 0.0–1.2)
Total Protein: 7.1 g/dL (ref 6.0–8.5)

## 2013-08-24 NOTE — Addendum Note (Signed)
Addended by: Magdalene River on: 08/24/2013 11:32 AM   Modules accepted: Orders

## 2013-08-25 ENCOUNTER — Telehealth: Payer: Self-pay | Admitting: Family Medicine

## 2013-08-26 ENCOUNTER — Other Ambulatory Visit: Payer: Self-pay | Admitting: Family Medicine

## 2013-08-26 NOTE — Telephone Encounter (Signed)
Pt re-called this am and also has f/u here monday

## 2013-08-30 ENCOUNTER — Encounter: Payer: Self-pay | Admitting: Family Medicine

## 2013-08-30 ENCOUNTER — Ambulatory Visit (INDEPENDENT_AMBULATORY_CARE_PROVIDER_SITE_OTHER): Payer: Medicare Other | Admitting: Family Medicine

## 2013-08-30 VITALS — BP 113/56 | HR 56 | Temp 97.0°F | Ht 65.0 in | Wt 158.2 lb

## 2013-08-30 DIAGNOSIS — I82402 Acute embolism and thrombosis of unspecified deep veins of left lower extremity: Secondary | ICD-10-CM

## 2013-08-30 DIAGNOSIS — R5383 Other fatigue: Secondary | ICD-10-CM | POA: Insufficient documentation

## 2013-08-30 DIAGNOSIS — R5381 Other malaise: Secondary | ICD-10-CM

## 2013-08-30 DIAGNOSIS — I82409 Acute embolism and thrombosis of unspecified deep veins of unspecified lower extremity: Secondary | ICD-10-CM

## 2013-08-30 DIAGNOSIS — I878 Other specified disorders of veins: Secondary | ICD-10-CM

## 2013-08-30 DIAGNOSIS — I872 Venous insufficiency (chronic) (peripheral): Secondary | ICD-10-CM

## 2013-08-30 DIAGNOSIS — I10 Essential (primary) hypertension: Secondary | ICD-10-CM

## 2013-08-30 DIAGNOSIS — K219 Gastro-esophageal reflux disease without esophagitis: Secondary | ICD-10-CM

## 2013-08-30 DIAGNOSIS — E079 Disorder of thyroid, unspecified: Secondary | ICD-10-CM | POA: Insufficient documentation

## 2013-08-30 NOTE — Progress Notes (Signed)
SUBJECTIVE: CC: Chief Complaint  Patient presents with  . Follow-up    DISCUSS LABS  REFILL OMPERAZOLE STATES SINCE ON THYROID MED CANT DO ANYTHING    HPI: Here for follow up. Was recently placed on a low dose levothyroxine because of subclinical hypothyroidism. however she is up all night and cannot sleep and in the daytime she is tired and sleepy from lack of sleep. No palpitations, no diarrhea, no headaches.   Past Medical History  Diagnosis Date  . GERD (gastroesophageal reflux disease)   . Hypertension   . Overactive bladder   . Thyroid disease     subclinical hypothyroidism  . DVT (deep venous thrombosis)    Past Surgical History  Procedure Laterality Date  . Total hip arthroplasty      right  . Total knee arthroplasty      Left  . Abdominal hysterectomy    . Ivc filter     History   Social History  . Marital Status: Widowed    Spouse Name: N/A    Number of Children: N/A  . Years of Education: N/A   Occupational History  . Not on file.   Social History Main Topics  . Smoking status: Former Games developer  . Smokeless tobacco: Not on file  . Alcohol Use: No  . Drug Use: No  . Sexual Activity: Not on file   Other Topics Concern  . Not on file   Social History Narrative  . No narrative on file   No family history on file. Current Outpatient Prescriptions on File Prior to Visit  Medication Sig Dispense Refill  . aspirin EC 81 MG tablet Take 81 mg by mouth at bedtime.      . cloNIDine (CATAPRES) 0.2 MG tablet Take 0.2 mg by mouth 3 (three) times daily.      . furosemide (LASIX) 20 MG tablet Take 20 mg by mouth 2 (two) times daily.      . Multiple Vitamin (MULTIVITAMIN WITH MINERALS) TABS tablet Take 1 tablet by mouth at bedtime.      Marland Kitchen omeprazole (PRILOSEC) 20 MG capsule Take 20 mg by mouth 2 (two) times daily.       No current facility-administered medications on file prior to visit.   No Known Allergies Immunization History  Administered Date(s)  Administered  . Influenza,inj,Quad PF,36+ Mos 07/28/2013   Prior to Admission medications   Medication Sig Start Date End Date Taking? Authorizing Provider  aspirin EC 81 MG tablet Take 81 mg by mouth at bedtime.   Yes Historical Provider, MD  cloNIDine (CATAPRES) 0.2 MG tablet Take 0.2 mg by mouth 3 (three) times daily.   Yes Historical Provider, MD  furosemide (LASIX) 20 MG tablet Take 20 mg by mouth 2 (two) times daily.   Yes Historical Provider, MD  levothyroxine (SYNTHROID, LEVOTHROID) 25 MCG tablet Take 25 mcg by mouth every morning.   Yes Historical Provider, MD  Multiple Vitamin (MULTIVITAMIN WITH MINERALS) TABS tablet Take 1 tablet by mouth at bedtime.   Yes Historical Provider, MD  omeprazole (PRILOSEC) 20 MG capsule Take 20 mg by mouth 2 (two) times daily.   Yes Historical Provider, MD  tolterodine (DETROL LA) 4 MG 24 hr capsule  06/30/13  Yes Historical Provider, MD     ROS: As above in the HPI. All other systems are stable or negative.  OBJECTIVE: APPEARANCE:  Patient in no acute distress.The patient appeared well nourished and normally developed. Acyanotic. Waist: VITAL SIGNS:BP 113/56  Pulse 56  Temp(Src) 97 F (36.1 C) (Oral)  Ht 5\' 5"  (1.651 m)  Wt 158 lb 3.2 oz (71.759 kg)  BMI 26.33 kg/m2 Elderly WF Ambulates with a rolling walker  SKIN: warm and  Dry without overt rashes, tattoos and scars  HEAD and Neck: without JVD, Head and scalp: normal Eyes:No scleral icterus. Fundi normal, eye movements normal. Ears: Auricle normal, canal normal, Tympanic membranes normal, insufflation normal. Nose: normal Throat: normal Neck & thyroid: normal  CHEST & LUNGS: Chest wall: normal Lungs: Clear  CVS: Reveals the PMI to be normally located. Regular rhythm, First and Second Heart sounds are normal,  absence of murmurs, rubs or gallops. Peripheral vasculature: Radial pulses: normal Dorsal pedis pulses: normal Posterior pulses: normal  ABDOMEN:  Appearance:  normal Benign, no organomegaly, no masses, no Abdominal Aortic enlargement. No Guarding , no rebound. No Bruits. Bowel sounds: normal  RECTAL: N/A GU: N/A  EXTREMETIES: trace edema.   NEUROLOGIC: oriented to time,place and person; nonfocal.  ASSESSMENT: Fatigue - due to levthyroxine  DVT of lower extremity (deep venous thrombosis), left  GERD (gastroesophageal reflux disease)  HTN (hypertension)  Venous stasis of lower extremity  Thyroid disease - Plan: levothyroxine (SYNTHROID, LEVOTHROID) 25 MCG tablet  PLAN:  No orders of the defined types were placed in this encounter.   Meds ordered this encounter  Medications  . DISCONTD: sulfamethoxazole-trimethoprim (BACTRIM DS) 800-160 MG per tablet    Sig:   . tolterodine (DETROL LA) 4 MG 24 hr capsule    Sig:   . levothyroxine (SYNTHROID, LEVOTHROID) 25 MCG tablet    Sig: Take 0.5 tablets (12.5 mcg total) by mouth every morning.   Medications Discontinued During This Encounter  Medication Reason  . ciprofloxacin (CIPRO) 250 MG tablet Completed Course  . nystatin-triamcinolone ointment (MYCOLOG) Completed Course  . sulfamethoxazole-trimethoprim (BACTRIM DS) 800-160 MG per tablet Completed Course  . levothyroxine (SYNTHROID, LEVOTHROID) 25 MCG tablet Reorder   Return in about 4 weeks (around 09/27/2013) for Recheck medical problems. and to recheck TSH and symptoms and other problems.  Ithzel Fedorchak P. [place] Charon, M.D.

## 2013-09-05 ENCOUNTER — Ambulatory Visit (INDEPENDENT_AMBULATORY_CARE_PROVIDER_SITE_OTHER): Payer: Self-pay | Admitting: Pharmacist

## 2013-09-05 DIAGNOSIS — I82409 Acute embolism and thrombosis of unspecified deep veins of unspecified lower extremity: Secondary | ICD-10-CM

## 2013-09-05 DIAGNOSIS — I82402 Acute embolism and thrombosis of unspecified deep veins of left lower extremity: Secondary | ICD-10-CM

## 2013-09-05 NOTE — Progress Notes (Signed)
Encounter created to discontinue patient from monitoring through [facility] anticoagulation clinic.  Warfarin was discontinued in hospital due to skull hematoma.

## 2013-09-05 NOTE — Progress Notes (Signed)
erro  neous encounter

## 2013-09-07 ENCOUNTER — Other Ambulatory Visit: Payer: Self-pay

## 2013-09-07 MED ORDER — OMEPRAZOLE 20 MG PO CPDR: 20.0000 mg | DELAYED_RELEASE_CAPSULE | Freq: Two times a day (BID) | ORAL | Status: DC

## 2013-09-27 ENCOUNTER — Ambulatory Visit (INDEPENDENT_AMBULATORY_CARE_PROVIDER_SITE_OTHER): Payer: Medicare Other | Admitting: Family Medicine

## 2013-09-27 ENCOUNTER — Encounter: Payer: Self-pay | Admitting: Family Medicine

## 2013-09-27 VITALS — BP 113/59 | HR 75 | Temp 97.3°F | Ht 65.0 in | Wt 154.4 lb

## 2013-09-27 DIAGNOSIS — I878 Other specified disorders of veins: Secondary | ICD-10-CM

## 2013-09-27 DIAGNOSIS — I10 Essential (primary) hypertension: Secondary | ICD-10-CM

## 2013-09-27 DIAGNOSIS — I872 Venous insufficiency (chronic) (peripheral): Secondary | ICD-10-CM

## 2013-09-27 DIAGNOSIS — I82409 Acute embolism and thrombosis of unspecified deep veins of unspecified lower extremity: Secondary | ICD-10-CM

## 2013-09-27 DIAGNOSIS — R5383 Other fatigue: Secondary | ICD-10-CM

## 2013-09-27 DIAGNOSIS — E079 Disorder of thyroid, unspecified: Secondary | ICD-10-CM

## 2013-09-27 DIAGNOSIS — K219 Gastro-esophageal reflux disease without esophagitis: Secondary | ICD-10-CM

## 2013-09-27 DIAGNOSIS — R5381 Other malaise: Secondary | ICD-10-CM

## 2013-09-27 DIAGNOSIS — I82403 Acute embolism and thrombosis of unspecified deep veins of lower extremity, bilateral: Secondary | ICD-10-CM

## 2013-09-27 MED ORDER — LEVOTHYROXINE SODIUM 25 MCG PO TABS: 12.5000 ug | ORAL_TABLET | Freq: Every morning | ORAL | Status: DC

## 2013-09-27 MED ORDER — OMEPRAZOLE 20 MG PO CPDR: 20.0000 mg | DELAYED_RELEASE_CAPSULE | Freq: Two times a day (BID) | ORAL | Status: DC

## 2013-09-27 MED ORDER — FUROSEMIDE 20 MG PO TABS: 20.0000 mg | ORAL_TABLET | Freq: Two times a day (BID) | ORAL | Status: DC

## 2013-09-27 MED ORDER — CLONIDINE HCL 0.2 MG PO TABS: 0.2000 mg | ORAL_TABLET | Freq: Three times a day (TID) | ORAL | Status: DC

## 2013-09-27 MED ORDER — TOLTERODINE TARTRATE ER 4 MG PO CP24: 4.0000 mg | ORAL_CAPSULE | Freq: Every day | ORAL | Status: DC

## 2013-09-27 NOTE — Progress Notes (Signed)
Patient ID: Tracy Walker, female   DOB: Mar 15, 1928, 79 y.o.   MRN: [id number] SUBJECTIVE: CC: Chief Complaint  Patient presents with  . Follow-up    RECK  LABS  legs look better no complaints    HPI: Had another episode of cellulitis which was treated by Urgent Care clinic a month ago. Resolved   Patient is here for follow up of hypertension/Venous stasis/DVT/GERD/Fatigue: denies Headache;deniesChest Pain;denies weakness;denies Shortness of Breath or Orthopnea;denies Visual changes;denies palpitations;denies cough;denies pedal edema;denies symptoms of TIA or stroke; admits to Compliance with medications. denies Problems with medications. Fatigue  Resolved with adjustment of the thyroid medication. She thinks her dose was the cause. Feels good now.   Past Medical History  Diagnosis Date  . GERD (gastroesophageal reflux disease)   . Hypertension   . Overactive bladder   . Thyroid disease     subclinical hypothyroidism  . DVT (deep venous thrombosis)    Past Surgical History  Procedure Laterality Date  . Total hip arthroplasty      right  . Total knee arthroplasty      Left  . Abdominal hysterectomy    . Ivc filter     History   Social History  . Marital Status: Widowed    Spouse Name: N/A    Number of Children: N/A  . Years of Education: N/A   Occupational History  . Not on file.   Social History Main Topics  . Smoking status: Former Games developer  . Smokeless tobacco: Not on file  . Alcohol Use: No  . Drug Use: No  . Sexual Activity: Not on file   Other Topics Concern  . Not on file   Social History Narrative  . No narrative on file   No family history on file. Current Outpatient Prescriptions on File Prior to Visit  Medication Sig Dispense Refill  . aspirin EC 81 MG tablet Take 81 mg by mouth at bedtime.      . Multiple Vitamin (MULTIVITAMIN WITH MINERALS) TABS tablet Take 1 tablet by mouth at bedtime.       No current facility-administered medications on  file prior to visit.   No Known Allergies Immunization History  Administered Date(s) Administered  . Influenza,inj,Quad PF,36+ Mos 07/28/2013  . Tdap 05/28/2012   Prior to Admission medications   Medication Sig Start Date End Date Taking? Authorizing Provider  aspirin EC 81 MG tablet Take 81 mg by mouth at bedtime.   Yes Historical Provider, MD  cloNIDine (CATAPRES) 0.2 MG tablet Take 0.2 mg by mouth 3 (three) times daily.   Yes Historical Provider, MD  furosemide (LASIX) 20 MG tablet Take 20 mg by mouth 2 (two) times daily.   Yes Historical Provider, MD  levothyroxine (SYNTHROID, LEVOTHROID) 25 MCG tablet Take 0.5 tablets (12.5 mcg total) by mouth every morning. 08/30/13  Yes Ileana Ladd, MD  Multiple Vitamin (MULTIVITAMIN WITH MINERALS) TABS tablet Take 1 tablet by mouth at bedtime.   Yes Historical Provider, MD  omeprazole (PRILOSEC) 20 MG capsule Take 1 capsule (20 mg total) by mouth 2 (two) times daily. 09/07/13  Yes Ileana Ladd, MD  tolterodine (DETROL LA) 4 MG 24 hr capsule  06/30/13  Yes Historical Provider, MD     ROS: As above in the HPI. All other systems are stable or negative.  OBJECTIVE: APPEARANCE:  Patient in no acute distress.The patient appeared well nourished and normally developed. Acyanotic. Waist: VITAL SIGNS:BP 113/59  Pulse 75  Temp(Src) 97.3 F (36.3  C) (Oral)  Ht 5\' 5"  (1.651 m)  Wt 154 lb 6.4 oz (70.035 kg)  BMI 25.69 kg/m2   SKIN: warm and  Dry without overt rashes, tattoos and scars  HEAD and Neck: without JVD, Head and scalp: normal Eyes:No scleral icterus. Fundi normal, eye movements normal. Ears: Auricle normal, canal normal, Tympanic membranes normal, insufflation normal. Nose: normal Throat: normal Neck & thyroid: normal  CHEST & LUNGS: Chest wall: normal Lungs: Clear  CVS: Reveals the PMI to be normally located. Regular rhythm, First and Second Heart sounds are normal,  absence of murmurs, rubs or gallops. Peripheral  vasculature: Radial pulses: normal Varicose veins. Distal pulses palpable reduced.  ABDOMEN:  Appearance: normal Benign, no organomegaly, no masses, no Abdominal Aortic enlargement. No Guarding , no rebound. No Bruits. Bowel sounds: normal  RECTAL: N/A GU: N/A  EXTREMETIES: trace edematous both lower extremities.  NEUROLOGIC: oriented to time,place and person; nonfocal. Ambulates with a rolling walker  Results for orders placed in visit on 08/23/13  CMP14+EGFR      Result Value Range   Glucose 124 (*) 65 - 99 mg/dL   BUN 31 (*) 8 - 27 mg/dL   Creatinine, Ser 1.61 (*) 0.57 - 1.00 mg/dL   GFR calc non Af Amer 26 (*) >59 mL/min/1.73   GFR calc Af Amer 30 (*) >59 mL/min/1.73   BUN/Creatinine Ratio 17  11 - 26   Sodium 142  134 - 144 mmol/L   Potassium 4.4  3.5 - 5.2 mmol/L   Chloride 103  97 - 108 mmol/L   CO2 23  18 - 29 mmol/L   Calcium 10.1  8.6 - 10.2 mg/dL   Total Protein 7.1  6.0 - 8.5 g/dL   Albumin 4.2  3.5 - 4.7 g/dL   Globulin, Total 2.9  1.5 - 4.5 g/dL   Albumin/Globulin Ratio 1.4  1.1 - 2.5   Total Bilirubin 0.2  0.0 - 1.2 mg/dL   Alkaline Phosphatase 74  39 - 117 IU/L   AST 19  0 - 40 IU/L   ALT 13  0 - 32 IU/L  THYROID PANEL WITH TSH      Result Value Range   TSH 6.120 (*) 0.450 - 4.500 uIU/mL   COMMENT Comment     T4, Total 6.9  4.5 - 12.0 ug/dL   T3 Uptake Ratio 31  24 - 39 %   Free Thyroxine Index 2.1  1.2 - 4.9  THYROID PANEL WITH TSH      Result Value Range   TSH 5.720 (*) 0.450 - 4.500 uIU/mL   COMMENT Comment     T4, Total 6.9  4.5 - 12.0 ug/dL   T3 Uptake Ratio 31  24 - 39 %   Free Thyroxine Index 2.1  1.2 - 4.9  POCT CBC      Result Value Range   WBC 6.6  4.6 - 10.2 K/uL   Lymph, poc 1.2  0.6 - 3.4   POC LYMPH PERCENT 18.8  10 - 50 %L   MID (cbc)    0 - 0.9   POC MID %    0 - 12 %M   POC Granulocyte 5.2  2 - 6.9   Granulocyte percent 78.1  37 - 80 %G   RBC 4.5  4.04 - 5.48 M/uL   Hemoglobin 12.0 (*) 12.2 - 16.2 g/dL   HCT, POC 09.6  (*) 04.5 - 47.9 %   MCV 83.6  80 - 97 fL  MCH, POC 26.9 (*) 27 - 31.2 pg   MCHC 32.1  31.8 - 35.4 g/dL   RDW, POC 96.0     Platelet Count, POC 407.0  142 - 424 K/uL   MPV 6.9  0 - 99.8 fL  POCT URINALYSIS DIPSTICK      Result Value Range   Color, UA yellow     Clarity, UA cloudy     Glucose, UA neg     Bilirubin, UA neg     Ketones, UA neg     Spec Grav, UA 1.010     Blood, UA neg     pH, UA 5.0     Protein, UA neg     Urobilinogen, UA negative     Nitrite, UA pos     Leukocytes, UA Trace    POCT UA - MICROSCOPIC ONLY      Result Value Range   WBC, Ur, HPF, POC 15-20     RBC, urine, microscopic neg     Bacteria, U Microscopic many     Mucus, UA neg     Epithelial cells, urine per micros occ     Crystals, Ur, HPF, POC neg     Casts, Ur, LPF, POC neg     Yeast, UA neg      ASSESSMENT: Fatigue - resolved with synthrpoid adjustment - Plan: Thyroid Panel With TSH  GERD (gastroesophageal reflux disease) - Plan: omeprazole (PRILOSEC) 20 MG capsule  HTN (hypertension) - Plan: furosemide (LASIX) 20 MG tablet, cloNIDine (CATAPRES) 0.2 MG tablet  Thyroid disease - Plan: levothyroxine (SYNTHROID, LEVOTHROID) 25 MCG tablet, Thyroid Panel With TSH  Venous stasis of lower extremity - stable  DVT of lower extremity (deep venous thrombosis), bilateral - resolved and stable   PLAN:  Rx written manually. Medium pressure knee high stockings #1 pair RF x 3  Orders Placed This Encounter  Procedures  . Thyroid Panel With TSH   Meds ordered this encounter  Medications  . levothyroxine (SYNTHROID, LEVOTHROID) 25 MCG tablet    Sig: Take 0.5 tablets (12.5 mcg total) by mouth every morning.    Dispense:  90 tablet    Refill:  1  . tolterodine (DETROL LA) 4 MG 24 hr capsule    Sig: Take 1 capsule (4 mg total) by mouth daily.    Dispense:  90 capsule    Refill:  1  . omeprazole (PRILOSEC) 20 MG capsule    Sig: Take 1 capsule (20 mg total) by mouth 2 (two) times daily.     Dispense:  180 capsule    Refill:  1  . furosemide (LASIX) 20 MG tablet    Sig: Take 1 tablet (20 mg total) by mouth 2 (two) times daily.    Dispense:  180 tablet    Refill:  1  . cloNIDine (CATAPRES) 0.2 MG tablet    Sig: Take 1 tablet (0.2 mg total) by mouth 3 (three) times daily.    Dispense:  270 tablet    Refill:  1   Medications Discontinued During This Encounter  Medication Reason  . levothyroxine (SYNTHROID, LEVOTHROID) 25 MCG tablet Reorder  . tolterodine (DETROL LA) 4 MG 24 hr capsule Reorder  . omeprazole (PRILOSEC) 20 MG capsule Reorder  . furosemide (LASIX) 20 MG tablet Reorder  . cloNIDine (CATAPRES) 0.2 MG tablet Reorder   Return in about 3 months (around 12/26/2013) for Recheck medical problems.  Srihitha Tagliaferri P. Modesto Charon, M.D.

## 2013-09-28 LAB — THYROID PANEL WITH TSH
Free Thyroxine Index: 2.2 (ref 1.2–4.9)
T3 Uptake Ratio: 36 % (ref 24–39)
T4, Total: 6 ug/dL (ref 4.5–12.0)
TSH: 10.17 u[IU]/mL — ABNORMAL HIGH (ref 0.450–4.500)

## 2013-09-29 ENCOUNTER — Ambulatory Visit (INDEPENDENT_AMBULATORY_CARE_PROVIDER_SITE_OTHER): Payer: Medicare Other | Admitting: *Deleted

## 2013-09-29 DIAGNOSIS — Z23 Encounter for immunization: Secondary | ICD-10-CM

## 2013-09-29 NOTE — Patient Instructions (Signed)
Shingles Vaccine  What You Need to Know  WHAT IS SHINGLES?  · Shingles is a painful skin rash, often with blisters. It is also called Herpes Zoster or just Zoster.  · A shingles rash usually appears on one side of the face or body and lasts from 2 to 4 weeks. Its main symptom is pain, which can be quite severe. Other symptoms of shingles can include fever, headache, chills, and upset stomach. Very rarely, a shingles infection can lead to pneumonia, hearing problems, blindness, brain inflammation (encephalitis), or death.  · For about 1 person in 5, severe pain can continue even after the rash clears up. This is called post-herpetic neuralgia.  · Shingles is caused by the Varicella Zoster virus. This is the same virus that causes chickenpox. Only someone who has had a case of chickenpox or rarely, has gotten chickenpox vaccine, can get shingles. The virus stays in your body. It can reappear many years later to cause a case of shingles.  · You cannot catch shingles from another person with shingles. However, a person who has never had chickenpox (or chickenpox vaccine) could get chickenpox from someone with shingles. This is not very common.  · Shingles is far more common in people 50 and older than in younger people. It is also more common in people whose immune systems are weakened because of a disease such as cancer or drugs such as steroids or chemotherapy.  · At least 1 million people get shingles per year in the [place].  SHINGLES VACCINE  · A vaccine for shingles was licensed in 2006. In clinical trials, the vaccine reduced the risk of shingles by 50%. It can also reduce the pain in people who still get shingles after being vaccinated.  · A single dose of shingles vaccine is recommended for adults 60 years of age and older.  SOME PEOPLE SHOULD NOT GET SHINGLES VACCINE OR SHOULD WAIT  A person should not get shingles vaccine if he or she:  · Has ever had a life-threatening allergic reaction to gelatin, the  antibiotic neomycin, or any other component of shingles vaccine. Tell your caregiver if you have any severe allergies.  · Has a weakened immune system because of current:  · AIDS or another disease that affects the immune system.  · Treatment with drugs that affect the immune system, such as prolonged use of high-dose steroids.  · Cancer treatment, such as radiation or chemotherapy.  · Cancer affecting the bone marrow or lymphatic system, such as leukemia or lymphoma.  · Is pregnant, or might be pregnant. Women should not become pregnant until at least 4 weeks after getting shingles vaccine.  Someone with a minor illness, such as a cold, may be vaccinated. Anyone with a moderate or severe acute illness should usually wait until he or she recovers before getting the vaccine. This includes anyone with a temperature of 101.3° F (38° C) or higher.  WHAT ARE THE RISKS FROM SHINGLES VACCINE?  · A vaccine, like any medicine, could possibly cause serious problems, such as severe allergic reactions. However, the risk of a vaccine causing serious harm, or death, is extremely small.  · No serious problems have been identified with shingles vaccine.  Mild Problems  · Redness, soreness, swelling, or itching at the site of the injection (about 1 person in 3).  · Headache (about 1 person in 70).  Like all vaccines, shingles vaccine is being closely monitored for unusual or severe problems.  WHAT IF   THERE IS A MODERATE OR SEVERE REACTION?  What should I look for?  Any unusual condition, such as a severe allergic reaction or a high fever. If a severe allergic reaction occurred, it would be within a few minutes to an hour after the shot. Signs of a serious allergic reaction can include difficulty breathing, weakness, hoarseness or wheezing, a fast heartbeat, hives, dizziness, paleness, or swelling of the throat.  What should I do?  · Call your caregiver, or get the person to a caregiver right away.  · Tell the caregiver what  happened, the date and time it happened, and when the vaccination was given.  · Ask the caregiver to report the reaction by filing a Vaccine Adverse Event Reporting System (VAERS) form. Or, you can file this report through the VAERS web site at www.vaers.hhs.gov or by calling 1-800-822-7967.  VAERS does not provide medical advice.  HOW CAN I LEARN MORE?  · Ask your caregiver. He or she can give you the vaccine package insert or suggest other sources of information.  · Contact the Centers for Disease Control and Prevention (CDC):  · Call 1-800-232-4636 (1-800-CDC-INFO).  · Visit the CDC website at www.cdc.gov/vaccines  CDC Shingles Vaccine VIS (08/02/08)  Document Released: 08/11/2006 Document Revised: 01/06/2012 Document Reviewed: 02/03/2013  ExitCare® Patient Information ©2014 [employer].

## 2013-09-29 NOTE — Progress Notes (Signed)
zostavax given and tolerated well 

## 2013-09-30 NOTE — Progress Notes (Signed)
Quick Note:  Call Patient Labs abnormal: hypothyroid still.  Recommendations: Take a whole thyroid tablet on Mondays and Thursdays, and continue with a half a tablet on the other days. Follow up to recheck thyroid and heart rate in 6 weeks.     ______

## 2013-11-01 ENCOUNTER — Ambulatory Visit (INDEPENDENT_AMBULATORY_CARE_PROVIDER_SITE_OTHER): Payer: Medicare HMO

## 2013-11-01 DIAGNOSIS — Z23 Encounter for immunization: Secondary | ICD-10-CM

## 2013-11-03 ENCOUNTER — Telehealth: Payer: Self-pay | Admitting: Family Medicine

## 2013-11-03 NOTE — Telephone Encounter (Signed)
appt moved to 3/16

## 2014-01-06 ENCOUNTER — Ambulatory Visit: Payer: Medicare Other | Admitting: Family Medicine

## 2014-01-10 ENCOUNTER — Encounter: Payer: Self-pay | Admitting: Family Medicine

## 2014-01-10 ENCOUNTER — Ambulatory Visit (INDEPENDENT_AMBULATORY_CARE_PROVIDER_SITE_OTHER): Payer: Medicare HMO | Admitting: Family Medicine

## 2014-01-10 VITALS — BP 145/72 | HR 63 | Temp 97.2°F | Ht 65.0 in | Wt 152.2 lb

## 2014-01-10 DIAGNOSIS — E079 Disorder of thyroid, unspecified: Secondary | ICD-10-CM

## 2014-01-10 DIAGNOSIS — I872 Venous insufficiency (chronic) (peripheral): Secondary | ICD-10-CM

## 2014-01-10 DIAGNOSIS — I878 Other specified disorders of veins: Secondary | ICD-10-CM

## 2014-01-10 DIAGNOSIS — N318 Other neuromuscular dysfunction of bladder: Secondary | ICD-10-CM

## 2014-01-10 DIAGNOSIS — I10 Essential (primary) hypertension: Secondary | ICD-10-CM

## 2014-01-10 DIAGNOSIS — K219 Gastro-esophageal reflux disease without esophagitis: Secondary | ICD-10-CM

## 2014-01-10 DIAGNOSIS — N3281 Overactive bladder: Secondary | ICD-10-CM | POA: Insufficient documentation

## 2014-01-10 MED ORDER — CLONIDINE HCL 0.2 MG PO TABS: 0.2000 mg | ORAL_TABLET | Freq: Three times a day (TID) | ORAL | Status: DC

## 2014-01-10 MED ORDER — TOLTERODINE TARTRATE ER 4 MG PO CP24: 4.0000 mg | ORAL_CAPSULE | Freq: Every day | ORAL | Status: DC

## 2014-01-10 MED ORDER — FUROSEMIDE 20 MG PO TABS: 20.0000 mg | ORAL_TABLET | Freq: Two times a day (BID) | ORAL | Status: DC

## 2014-01-10 NOTE — Progress Notes (Signed)
Patient ID: Tracy Walker, female   DOB: 1928/09/21, 80 y.o.   MRN: [id number] SUBJECTIVE: CC: Chief Complaint  Patient presents with  . Follow-up    3 month follow up chronic problems. wants 90 day supply refills    HPI: Patient is here for follow up of hypertension/OAB/subclinical hypothyroid/GERD: denies Headache;deniesChest Pain;denies weakness;denies Shortness of Breath or Orthopnea;denies Visual changes;denies palpitations;denies cough;denies pedal edema;denies symptoms of TIA or stroke; admits to Compliance with medications. denies Problems with medications.  Past Medical History  Diagnosis Date  . GERD (gastroesophageal reflux disease)   . Hypertension   . Thyroid disease     subclinical hypothyroidism  . DVT (deep venous thrombosis)   . Overactive bladder    Past Surgical History  Procedure Laterality Date  . Total hip arthroplasty      right  . Total knee arthroplasty      Left  . Abdominal hysterectomy    . Ivc filter     History   Social History  . Marital Status: Widowed    Spouse Name: N/A    Number of Children: N/A  . Years of Education: N/A   Occupational History  . Not on file.   Social History Main Topics  . Smoking status: Former Research scientist (life sciences)  . Smokeless tobacco: Not on file  . Alcohol Use: No  . Drug Use: No  . Sexual Activity: Not on file   Other Topics Concern  . Not on file   Social History Narrative  . No narrative on file   No family history on file. Current Outpatient Prescriptions on File Prior to Visit  Medication Sig Dispense Refill  . aspirin EC 81 MG tablet Take 81 mg by mouth at bedtime.      . Multiple Vitamin (MULTIVITAMIN WITH MINERALS) TABS tablet Take 1 tablet by mouth at bedtime.       No current facility-administered medications on file prior to visit.   No Known Allergies Immunization History  Administered Date(s) Administered  . Influenza,inj,Quad PF,36+ Mos 07/28/2013  . Pneumococcal Conjugate-13 11/01/2013  .  Tdap 05/28/2012  . Zoster 09/29/2013   Prior to Admission medications   Medication Sig Start Date End Date Taking? Authorizing Provider  aspirin EC 81 MG tablet Take 81 mg by mouth at bedtime.   Yes Historical Provider, MD  cloNIDine (CATAPRES) 0.2 MG tablet Take 1 tablet (0.2 mg total) by mouth 3 (three) times daily. 09/27/13  Yes Vernie Shanks, MD  furosemide (LASIX) 20 MG tablet Take 1 tablet (20 mg total) by mouth 2 (two) times daily. 09/27/13  Yes Vernie Shanks, MD  Multiple Vitamin (MULTIVITAMIN WITH MINERALS) TABS tablet Take 1 tablet by mouth at bedtime.   Yes Historical Provider, MD  tolterodine (DETROL LA) 4 MG 24 hr capsule Take 1 capsule (4 mg total) by mouth daily. 09/27/13  Yes Vernie Shanks, MD  levothyroxine (SYNTHROID, LEVOTHROID) 25 MCG tablet Take 0.5 tablets (12.5 mcg total) by mouth every morning. 09/27/13   Vernie Shanks, MD  omeprazole (PRILOSEC) 20 MG capsule Take 1 capsule (20 mg total) by mouth 2 (two) times daily. 09/27/13   Vernie Shanks, MD     ROS: As above in the HPI. All other systems are stable or negative.  OBJECTIVE: APPEARANCE:  Patient in no acute distress.The patient appeared well nourished and normally developed. Acyanotic. Waist: VITAL SIGNS:BP 145/72  Pulse 63  Temp(Src) 97.2 F (36.2 C) (Oral)  Ht 5' 5" (1.651 m)  Wt  152 lb 3.2 oz (69.037 kg)  BMI 25.33 kg/m2   SKIN: warm and  Dry without overt rashes, tattoos and scars  HEAD and Neck: without JVD, Head and scalp: normal Eyes:No scleral icterus. Fundi normal, eye movements normal. Ears: Auricle normal, canal normal, Tympanic membranes normal, insufflation normal. Nose: normal Throat: normal Neck & thyroid: normal  CHEST & LUNGS: Chest wall: normal Lungs: Clear  CVS: Reveals the PMI to be normally located. Regular rhythm, First and Second Heart sounds are normal,  absence of murmurs, rubs or gallops. Peripheral vasculature: Radial pulses: normal Dorsal pedis pulses:  normal Posterior pulses: normal  ABDOMEN:  Appearance: normal Benign, no organomegaly, no masses, no Abdominal Aortic enlargement. No Guarding , no rebound. No Bruits. Bowel sounds: normal  RECTAL: N/A GU: N/A  EXTREMETIES: trace edematous with venous stasis  NEUROLOGIC: oriented to time,place and person; nonfocal.  ASSESSMENT:  HTN (hypertension) - Plan: cloNIDine (CATAPRES) 0.2 MG tablet, furosemide (LASIX) 20 MG tablet, CMP14+EGFR  Venous stasis of lower extremity  GERD (gastroesophageal reflux disease)  Thyroid disease  Overactive bladder - Plan: tolterodine (DETROL LA) 4 MG 24 hr capsule  PLAN: DASH Diet handout in the AVS discussed.  Orders Placed This Encounter  Procedures  . CMP14+EGFR   Meds ordered this encounter  Medications  . cloNIDine (CATAPRES) 0.2 MG tablet    Sig: Take 1 tablet (0.2 mg total) by mouth 3 (three) times daily.    Dispense:  270 tablet    Refill:  1  . furosemide (LASIX) 20 MG tablet    Sig: Take 1 tablet (20 mg total) by mouth 2 (two) times daily.    Dispense:  180 tablet    Refill:  1  . tolterodine (DETROL LA) 4 MG 24 hr capsule    Sig: Take 1 capsule (4 mg total) by mouth daily.    Dispense:  90 capsule    Refill:  1  patient refuses to be on any thyroid medications. She says s he feels bad with it.  Medications Discontinued During This Encounter  Medication Reason  . levothyroxine (SYNTHROID, LEVOTHROID) 25 MCG tablet Patient has not taken in last 30 days  . omeprazole (PRILOSEC) 20 MG capsule Patient has not taken in last 30 days  . cloNIDine (CATAPRES) 0.2 MG tablet Reorder  . furosemide (LASIX) 20 MG tablet Reorder  . tolterodine (DETROL LA) 4 MG 24 hr capsule Reorder   Return in about 4 months (around 05/12/2014) for Recheck medical problems.  Mason Burleigh P. Jacelyn Grip, M.D.

## 2014-01-10 NOTE — Patient Instructions (Signed)
DASH Diet  The DASH diet stands for "Dietary Approaches to Stop Hypertension." It is a healthy eating plan that has been shown to reduce high blood pressure (hypertension) in as little as 14 days, while also possibly providing other significant health benefits. These other health benefits include reducing the risk of breast cancer after menopause and reducing the risk of type 2 diabetes, heart disease, colon cancer, and stroke. Health benefits also include weight loss and slowing kidney failure in patients with chronic kidney disease.   DIET GUIDELINES  · Limit salt (sodium). Your diet should contain less than 1500 mg of sodium daily.  · Limit refined or processed carbohydrates. Your diet should include mostly whole grains. Desserts and added sugars should be used sparingly.  · Include small amounts of heart-healthy fats. These types of fats include nuts, oils, and tub margarine. Limit saturated and trans fats. These fats have been shown to be harmful in the body.  CHOOSING FOODS   The following food groups are based on a 2000 calorie diet. See your Registered Dietitian for individual calorie needs.  Grains and Grain Products (6 to 8 servings daily)  · Eat More Often: Whole-wheat bread, brown rice, whole-grain or wheat pasta, quinoa, popcorn without added fat or salt (air popped).  · Eat Less Often: White bread, white pasta, white rice, cornbread.  Vegetables (4 to 5 servings daily)  · Eat More Often: Fresh, frozen, and canned vegetables. Vegetables may be raw, steamed, roasted, or grilled with a minimal amount of fat.  · Eat Less Often/Avoid: Creamed or fried vegetables. Vegetables in a cheese sauce.  Fruit (4 to 5 servings daily)  · Eat More Often: All fresh, canned (in natural juice), or frozen fruits. Dried fruits without added sugar. One hundred percent fruit juice (½ cup [237 mL] daily).  · Eat Less Often: Dried fruits with added sugar. Canned fruit in light or heavy syrup.  Lean Meats, Fish, and Poultry (2  servings or less daily. One serving is 3 to 4 oz [85-114 g]).  · Eat More Often: Ninety percent or leaner ground beef, tenderloin, sirloin. Round cuts of beef, chicken breast, turkey breast. All fish. Grill, bake, or broil your meat. Nothing should be fried.  · Eat Less Often/Avoid: Fatty cuts of meat, turkey, or chicken leg, thigh, or wing. Fried cuts of meat or fish.  Dairy (2 to 3 servings)  · Eat More Often: Low-fat or fat-free milk, low-fat plain or light yogurt, reduced-fat or part-skim cheese.  · Eat Less Often/Avoid: Milk (whole, 2%). Whole milk yogurt. Full-fat cheeses.  Nuts, Seeds, and Legumes (4 to 5 servings per week)  · Eat More Often: All without added salt.  · Eat Less Often/Avoid: Salted nuts and seeds, canned beans with added salt.  Fats and Sweets (limited)  · Eat More Often: Vegetable oils, tub margarines without trans fats, sugar-free gelatin. Mayonnaise and salad dressings.  · Eat Less Often/Avoid: Coconut oils, palm oils, butter, stick margarine, cream, half and half, cookies, candy, pie.  FOR MORE INFORMATION  The Dash Diet Eating Plan: www.dashdiet.org  Document Released: 10/03/2011 Document Revised: 01/06/2012 Document Reviewed: 10/03/2011  ExitCare® Patient Information ©2014 [employer].

## 2014-01-11 LAB — CMP14+EGFR
ALT: 9 IU/L (ref 0–32)
AST: 19 IU/L (ref 0–40)
Albumin/Globulin Ratio: 1.5 (ref 1.1–2.5)
Albumin: 4 g/dL (ref 3.5–4.7)
Alkaline Phosphatase: 56 IU/L (ref 39–117)
BUN/Creatinine Ratio: 15 (ref 11–26)
BUN: 31 mg/dL — ABNORMAL HIGH (ref 8–27)
CO2: 23 mmol/L (ref 18–29)
Calcium: 9.3 mg/dL (ref 8.7–10.3)
Chloride: 102 mmol/L (ref 97–108)
Creatinine, Ser: 2.11 mg/dL — ABNORMAL HIGH (ref 0.57–1.00)
GFR calc Af Amer: 24 mL/min/{1.73_m2} — ABNORMAL LOW (ref >59)
GFR calc non Af Amer: 21 mL/min/{1.73_m2} — ABNORMAL LOW (ref >59)
Globulin, Total: 2.6 g/dL (ref 1.5–4.5)
Glucose: 99 mg/dL (ref 65–99)
Potassium: 4.5 mmol/L (ref 3.5–5.2)
Sodium: 141 mmol/L (ref 134–144)
Total Bilirubin: 0.2 mg/dL (ref 0.0–1.2)
Total Protein: 6.6 g/dL (ref 6.0–8.5)

## 2014-01-11 NOTE — Progress Notes (Signed)
Quick Note:  Call Patient Labs that are abnormal: Kidney function deteriorating.   The rest are at goal  Recommendations: I suggest that she sees the kidney specialist. Will she be willing to go?    ______

## 2014-02-04 ENCOUNTER — Emergency Department (HOSPITAL_COMMUNITY): Payer: Medicare HMO

## 2014-02-04 ENCOUNTER — Inpatient Hospital Stay (HOSPITAL_COMMUNITY): Payer: Medicare HMO

## 2014-02-04 ENCOUNTER — Inpatient Hospital Stay (HOSPITAL_COMMUNITY)
Admission: EM | Admit: 2014-02-04 | Discharge: 2014-02-10 | DRG: 065 | Disposition: A | Discharge status: Skilled Nursing Facility | Payer: Medicare HMO | Attending: Family Medicine | Admitting: Family Medicine

## 2014-02-04 ENCOUNTER — Encounter (HOSPITAL_COMMUNITY): Payer: Self-pay | Admitting: Emergency Medicine

## 2014-02-04 DIAGNOSIS — Z96649 Presence of unspecified artificial hip joint: Secondary | ICD-10-CM

## 2014-02-04 DIAGNOSIS — Z87891 Personal history of nicotine dependence: Secondary | ICD-10-CM

## 2014-02-04 DIAGNOSIS — R9401 Abnormal electroencephalogram [EEG]: Secondary | ICD-10-CM | POA: Diagnosis present

## 2014-02-04 DIAGNOSIS — E669 Obesity, unspecified: Secondary | ICD-10-CM | POA: Diagnosis present

## 2014-02-04 DIAGNOSIS — I129 Hypertensive chronic kidney disease with stage 1 through stage 4 chronic kidney disease, or unspecified chronic kidney disease: Secondary | ICD-10-CM | POA: Diagnosis present

## 2014-02-04 DIAGNOSIS — Z7982 Long term (current) use of aspirin: Secondary | ICD-10-CM

## 2014-02-04 DIAGNOSIS — I639 Cerebral infarction, unspecified: Secondary | ICD-10-CM | POA: Diagnosis present

## 2014-02-04 DIAGNOSIS — I4891 Unspecified atrial fibrillation: Secondary | ICD-10-CM | POA: Diagnosis present

## 2014-02-04 DIAGNOSIS — N179 Acute kidney failure, unspecified: Secondary | ICD-10-CM | POA: Diagnosis present

## 2014-02-04 DIAGNOSIS — I10 Essential (primary) hypertension: Secondary | ICD-10-CM | POA: Diagnosis present

## 2014-02-04 DIAGNOSIS — B373 Candidiasis of vulva and vagina: Secondary | ICD-10-CM | POA: Diagnosis not present

## 2014-02-04 DIAGNOSIS — N390 Urinary tract infection, site not specified: Secondary | ICD-10-CM | POA: Diagnosis present

## 2014-02-04 DIAGNOSIS — Z96659 Presence of unspecified artificial knee joint: Secondary | ICD-10-CM

## 2014-02-04 DIAGNOSIS — I674 Hypertensive encephalopathy: Secondary | ICD-10-CM | POA: Diagnosis present

## 2014-02-04 DIAGNOSIS — K219 Gastro-esophageal reflux disease without esophagitis: Secondary | ICD-10-CM | POA: Diagnosis present

## 2014-02-04 DIAGNOSIS — I160 Hypertensive urgency: Secondary | ICD-10-CM | POA: Diagnosis present

## 2014-02-04 DIAGNOSIS — G9340 Encephalopathy, unspecified: Secondary | ICD-10-CM

## 2014-02-04 DIAGNOSIS — Z9181 History of falling: Secondary | ICD-10-CM

## 2014-02-04 DIAGNOSIS — Z86718 Personal history of other venous thrombosis and embolism: Secondary | ICD-10-CM

## 2014-02-04 DIAGNOSIS — I6350 Cerebral infarction due to unspecified occlusion or stenosis of unspecified cerebral artery: Principal | ICD-10-CM | POA: Diagnosis present

## 2014-02-04 DIAGNOSIS — N189 Chronic kidney disease, unspecified: Secondary | ICD-10-CM | POA: Diagnosis present

## 2014-02-04 DIAGNOSIS — N318 Other neuromuscular dysfunction of bladder: Secondary | ICD-10-CM | POA: Diagnosis present

## 2014-02-04 DIAGNOSIS — E039 Hypothyroidism, unspecified: Secondary | ICD-10-CM | POA: Diagnosis present

## 2014-02-04 DIAGNOSIS — Z66 Do not resuscitate: Secondary | ICD-10-CM | POA: Diagnosis present

## 2014-02-04 DIAGNOSIS — B3731 Acute candidiasis of vulva and vagina: Secondary | ICD-10-CM | POA: Diagnosis not present

## 2014-02-04 DIAGNOSIS — E079 Disorder of thyroid, unspecified: Secondary | ICD-10-CM | POA: Diagnosis present

## 2014-02-04 DIAGNOSIS — R4701 Aphasia: Secondary | ICD-10-CM | POA: Diagnosis present

## 2014-02-04 DIAGNOSIS — F0390 Unspecified dementia without behavioral disturbance: Secondary | ICD-10-CM | POA: Diagnosis present

## 2014-02-04 HISTORY — DX: Chronic kidney disease, unspecified: N18.9

## 2014-02-04 LAB — CBC WITH DIFFERENTIAL/PLATELET
Basophils Absolute: 0 10*3/uL (ref 0.0–0.1)
Basophils Relative: 0 % (ref 0–1)
EOS PCT: 0 % (ref 0–5)
Eosinophils Absolute: 0 10*3/uL (ref 0.0–0.7)
HEMATOCRIT: 43.3 % (ref 36.0–46.0)
HEMOGLOBIN: 14.8 g/dL (ref 12.0–15.0)
LYMPHS ABS: 0.8 10*3/uL (ref 0.7–4.0)
Lymphocytes Relative: 8 % — ABNORMAL LOW (ref 12–46)
MCH: 29.1 pg (ref 26.0–34.0)
MCHC: 34.2 g/dL (ref 30.0–36.0)
MCV: 85.1 fL (ref 78.0–100.0)
MONO ABS: 1 10*3/uL (ref 0.1–1.0)
MONOS PCT: 9 % (ref 3–12)
NEUTROS ABS: 8.4 10*3/uL — AB (ref 1.7–7.7)
Neutrophils Relative %: 82 % — ABNORMAL HIGH (ref 43–77)
Platelets: 256 10*3/uL (ref 150–400)
RBC: 5.09 MIL/uL (ref 3.87–5.11)
RDW: 14.9 % (ref 11.5–15.5)
WBC: 10.2 10*3/uL (ref 4.0–10.5)

## 2014-02-04 LAB — URINALYSIS, ROUTINE W REFLEX MICROSCOPIC
Bilirubin Urine: NEGATIVE
Glucose, UA: NEGATIVE mg/dL
Ketones, ur: NEGATIVE mg/dL
Nitrite: POSITIVE — AB
PROTEIN: 30 mg/dL — AB
Specific Gravity, Urine: 1.015 (ref 1.005–1.030)
UROBILINOGEN UA: 0.2 mg/dL (ref 0.0–1.0)
pH: 7.5 (ref 5.0–8.0)

## 2014-02-04 LAB — URINE MICROSCOPIC-ADD ON

## 2014-02-04 LAB — MRSA PCR SCREENING: MRSA by PCR: POSITIVE — AB

## 2014-02-04 LAB — COMPREHENSIVE METABOLIC PANEL
ALT: 15 U/L (ref 0–35)
AST: 36 U/L (ref 0–37)
Albumin: 3.6 g/dL (ref 3.5–5.2)
Alkaline Phosphatase: 57 U/L (ref 39–117)
BILIRUBIN TOTAL: 0.8 mg/dL (ref 0.3–1.2)
BUN: 18 mg/dL (ref 6–23)
CALCIUM: 9.6 mg/dL (ref 8.4–10.5)
CO2: 27 meq/L (ref 19–32)
CREATININE: 1.26 mg/dL — AB (ref 0.50–1.10)
Chloride: 101 mEq/L (ref 96–112)
GFR calc Af Amer: 44 mL/min — ABNORMAL LOW (ref >90)
GFR, EST NON AFRICAN AMERICAN: 38 mL/min — AB (ref >90)
GLUCOSE: 121 mg/dL — AB (ref 70–99)
Potassium: 3.4 mEq/L — ABNORMAL LOW (ref 3.7–5.3)
Sodium: 142 mEq/L (ref 137–147)
Total Protein: 7.7 g/dL (ref 6.0–8.3)

## 2014-02-04 LAB — CK: CK TOTAL: 320 U/L — AB (ref 7–177)

## 2014-02-04 LAB — TROPONIN I
Troponin I: <0.3 ng/mL (ref <0.30)
Troponin I: 0.31 ng/mL (ref <0.30)

## 2014-02-04 LAB — PROTIME-INR
INR: 1.07 (ref 0.00–1.49)
Prothrombin Time: 13.7 seconds (ref 11.6–15.2)

## 2014-02-04 LAB — MAGNESIUM: Magnesium: 2.2 mg/dL (ref 1.5–2.5)

## 2014-02-04 MED ORDER — DEXTROSE 5 % IV SOLN
0.5000 mg/min | INTRAVENOUS | Status: AC
  Filled 2014-02-04: qty 100

## 2014-02-04 MED ORDER — DEXTROSE 5 % IV SOLN
1.0000 g | Freq: Once | INTRAVENOUS | Status: AC
  Administered 2014-02-04: 1 g via INTRAVENOUS
  Filled 2014-02-04: qty 10

## 2014-02-04 MED ORDER — DEXTROSE 5 % IV SOLN
1.0000 g | INTRAVENOUS | Status: DC
  Administered 2014-02-05 – 2014-02-06 (×2): 1 g via INTRAVENOUS
  Filled 2014-02-04 (×3): qty 10

## 2014-02-04 MED ORDER — ONDANSETRON HCL 4 MG PO TABS: 4.0000 mg | ORAL_TABLET | Freq: Four times a day (QID) | ORAL | Status: DC | PRN

## 2014-02-04 MED ORDER — ACETAMINOPHEN 650 MG RE SUPP: 650.0000 mg | Freq: Four times a day (QID) | RECTAL | Status: DC | PRN

## 2014-02-04 MED ORDER — LABETALOL HCL 5 MG/ML IV SOLN
0.5000 mg/min | INTRAVENOUS | Status: DC
  Administered 2014-02-04: 0.5 mg/min via INTRAVENOUS
  Filled 2014-02-04: qty 100

## 2014-02-04 MED ORDER — ASPIRIN EC 325 MG PO TBEC
325.0000 mg | DELAYED_RELEASE_TABLET | Freq: Every day | ORAL | Status: DC
  Administered 2014-02-04 – 2014-02-09 (×7): 325 mg via ORAL
  Filled 2014-02-04 (×7): qty 1

## 2014-02-04 MED ORDER — ENOXAPARIN SODIUM 40 MG/0.4ML ~~LOC~~ SOLN
40.0000 mg | SUBCUTANEOUS | Status: DC
  Administered 2014-02-04 – 2014-02-05 (×2): 40 mg via SUBCUTANEOUS
  Filled 2014-02-04 (×2): qty 0.4

## 2014-02-04 MED ORDER — PANTOPRAZOLE SODIUM 40 MG PO TBEC
40.0000 mg | DELAYED_RELEASE_TABLET | Freq: Every day | ORAL | Status: DC
  Administered 2014-02-04 – 2014-02-10 (×7): 40 mg via ORAL
  Filled 2014-02-04 (×7): qty 1

## 2014-02-04 MED ORDER — ACETAMINOPHEN 325 MG PO TABS
650.0000 mg | ORAL_TABLET | Freq: Four times a day (QID) | ORAL | Status: DC | PRN
Start: 2014-02-04 — End: 2014-02-10

## 2014-02-04 MED ORDER — CLONIDINE HCL 0.2 MG PO TABS
0.2000 mg | ORAL_TABLET | ORAL | Status: AC
  Administered 2014-02-04: 0.2 mg via ORAL
  Filled 2014-02-04: qty 1

## 2014-02-04 MED ORDER — CLONIDINE HCL 0.2 MG PO TABS
0.2000 mg | ORAL_TABLET | Freq: Three times a day (TID) | ORAL | Status: DC
  Administered 2014-02-04: 0.2 mg via ORAL
  Filled 2014-02-04: qty 1

## 2014-02-04 MED ORDER — ADULT MULTIVITAMIN W/MINERALS CH
1.0000 | ORAL_TABLET | Freq: Every day | ORAL | Status: DC
  Administered 2014-02-04 – 2014-02-09 (×6): 1 via ORAL
  Filled 2014-02-04 (×7): qty 1

## 2014-02-04 MED ORDER — FESOTERODINE FUMARATE ER 4 MG PO TB24
4.0000 mg | ORAL_TABLET | Freq: Every day | ORAL | Status: DC
  Administered 2014-02-04 – 2014-02-10 (×7): 4 mg via ORAL
  Filled 2014-02-04 (×10): qty 1

## 2014-02-04 MED ORDER — ASPIRIN EC 81 MG PO TBEC: 81.0000 mg | DELAYED_RELEASE_TABLET | Freq: Every day | ORAL | Status: DC

## 2014-02-04 MED ORDER — CHLORHEXIDINE GLUCONATE CLOTH 2 % EX PADS
6.0000 | MEDICATED_PAD | Freq: Every day | CUTANEOUS | Status: AC
  Administered 2014-02-05 – 2014-02-09 (×5): 6 via TOPICAL

## 2014-02-04 MED ORDER — ALUM & MAG HYDROXIDE-SIMETH 200-200-20 MG/5ML PO SUSP: 30.0000 mL | Freq: Four times a day (QID) | ORAL | Status: DC | PRN

## 2014-02-04 MED ORDER — MUPIROCIN 2 % EX OINT
1.0000 "application " | TOPICAL_OINTMENT | Freq: Two times a day (BID) | CUTANEOUS | Status: AC
  Administered 2014-02-04 – 2014-02-09 (×10): 1 via NASAL
  Filled 2014-02-04 (×4): qty 22

## 2014-02-04 MED ORDER — ONDANSETRON HCL 4 MG/2ML IJ SOLN
4.0000 mg | Freq: Four times a day (QID) | INTRAMUSCULAR | Status: DC | PRN
  Administered 2014-02-07: 4 mg via INTRAVENOUS
  Filled 2014-02-04: qty 2

## 2014-02-04 MED ORDER — HYDROCODONE-ACETAMINOPHEN 5-325 MG PO TABS: 1.0000 | ORAL_TABLET | ORAL | Status: DC | PRN

## 2014-02-04 NOTE — Progress Notes (Signed)
Daughter in Education officer, [employer] called and spoke to patient, she was pleased with her orientation, patient has no CP with mildly elevated troponins.

## 2014-02-04 NOTE — Progress Notes (Signed)
Notified Dr. Claiborne Billingsallahan of elevated troponin 0.31. No new orders at this time.

## 2014-02-04 NOTE — H&P (Signed)
[facility] History and Physical  Tracy Walker:[id number] DOB: 09/15/1928 DOA: 02/04/2014  Referring physician:  PCP: Tracy Heap, MD   Chief Complaint: fall/confusion  HPI: Tracy Walker is a 80 y.o. female with a past medical history that includes hypertension, DVT no longer on anticoagulation and has a filter, thyroid disease, chronic kidney disease, presents to the emergency department with the chief complaint of fall. Information is obtained from the son who is at the bedside as patient is currently confused. Patient lives alone but is checked on frequently by family members and daily by police department. Family member reports that the police department calls the patient every morning and this morning she did not answer the phone. Son states that he spoke to the patient last evening around 9 PM and noted nothing out of the ordinary. When the family went to check on the patient this morning around on a clock they found her on the floor. Patient has no recollection of when she fell or how she fell were the circumstances prior to her falling. There has been no recent illness fever or chills nausea vomiting diarrhea. She has been eating and drinking her normal amount and there has been no unintentional weight loss. She does have a history of falling and is not steady on her feet do to a history of hip and knee replacements. Initial workup reveals hypertension at 228/122, EKG with SR with PAC's, creatinine 1.26 urinary tract infection. CT of the head cervical spine without acute abnormality. Chest x-ray and pelvis x-ray without acute abnormality. In the emergency department she received 1 g of Rocephin intravenously and Catapres 0.2 mg orally.  Review of Systems:  10 point review of systems completed to the best of our ability given patient's acute encephalopathy. Systems negative except as indicated in history of present illness   Past Medical History  Diagnosis Date  . GERD  (gastroesophageal reflux disease)   . Hypertension   . Thyroid disease     subclinical hypothyroidism  . DVT (deep venous thrombosis)   . Overactive bladder   . CKD (chronic kidney disease)    Past Surgical History  Procedure Laterality Date  . Total hip arthroplasty      right  . Total knee arthroplasty      Left  . Abdominal hysterectomy    . Ivc filter     Social History:  reports that she has quit smoking. She does not have any smokeless tobacco history on file. She reports that she does not drink alcohol or use illicit drugs. She lives at home alone she ambulates with a walker or cane is fairly independent with ADLs No Known Allergies  History reviewed. No pertinent family history.   Prior to Admission medications   Medication Sig Start Date End Date Taking? Authorizing Provider  aspirin EC 81 MG tablet Take 81 mg by mouth at bedtime.   Yes Historical Provider, MD  cloNIDine (CATAPRES) 0.2 MG tablet Take 1 tablet (0.2 mg total) by mouth 3 (three) times daily. 01/10/14  Yes Tracy Ladd, MD  furosemide (LASIX) 20 MG tablet Take 1 tablet (20 mg total) by mouth 2 (two) times daily. 01/10/14  Yes Tracy Ladd, MD  Multiple Vitamin (MULTIVITAMIN WITH MINERALS) TABS tablet Take 1 tablet by mouth at bedtime.   Yes Historical Provider, MD  omeprazole (PRILOSEC) 40 MG capsule Take 40 mg by mouth daily. 12/10/13  Yes Historical Provider, MD  tolterodine (DETROL LA) 4 MG 24 hr  capsule Take 1 capsule (4 mg total) by mouth daily. 01/10/14  Yes Tracy LaddFrancis P Wong, MD   Physical Exam: Filed Vitals:   02/04/14 1458  BP: 228/122  Pulse: 106  Temp:   Resp: 18    BP 228/122  Pulse 106  Temp(Src) 98.9 F (37.2 C) (Rectal)  Resp 18  SpO2 95%  General:  Appears calm and comfortable Eyes: PERRL, normal lids, irises & conjunctiva ENT: grossly normal hearing, lips & tongue Neck: no LAD, masses or thyromegaly Cardiovascular: Irregularly irregular no murmur no gallop or rub no lower  extremity edema pedal pulses present and palpable Telemetry: Monitor in exam room shows A. fib at a rate of 110 Respiratory: CTA bilaterally, no w/r/r. Normal respiratory effort. Abdomen: soft, ntnd positive bowel sounds throughout no mass organomegaly noted Skin: no rash or induration seen on limited exam Musculoskeletal: grossly normal tone BUE/BLE Psychiatric: grossly normal mood and affect, speech fluent and appropriate Neurologic: grossly non-focal. Oriented to self only able to follow commands bilateral upper extremity strength 5 over 5 bilateral lower extremity strength 5 out of 5 facial symmetry no pronator drift speech clear           Labs on Admission:  Basic Metabolic Panel:  Recent Labs Lab 02/04/14 1122  NA 142  K 3.4*  CL 101  CO2 27  GLUCOSE 121*  BUN 18  CREATININE 1.26*  CALCIUM 9.6   Liver Function Tests:  Recent Labs Lab 02/04/14 1122  AST 36  ALT 15  ALKPHOS 57  BILITOT 0.8  PROT 7.7  ALBUMIN 3.6   No results found for this basename: LIPASE, AMYLASE,  in the last 168 hours No results found for this basename: AMMONIA,  in the last 168 hours CBC:  Recent Labs Lab 02/04/14 1122  WBC 10.2  NEUTROABS 8.4*  HGB 14.8  HCT 43.3  MCV 85.1  PLT 256   Cardiac Enzymes:  Recent Labs Lab 02/04/14 1122  CKTOTAL 320*  TROPONINI <0.30    BNP (last 3 results) No results found for this basename: PROBNP,  in the last 8760 hours CBG: No results found for this basename: GLUCAP,  in the last 168 hours  Radiological Exams on Admission: Ct Head Wo Contrast  02/04/2014   CLINICAL DATA:  Fall.  Altered level of consciousness.  EXAM: CT HEAD WITHOUT CONTRAST  CT CERVICAL SPINE WITHOUT CONTRAST  TECHNIQUE: Multidetector CT imaging of the head and cervical spine was performed following the standard protocol without intravenous contrast. Multiplanar CT image reconstructions of the cervical spine were also generated.  COMPARISON:  CT head 05/18/2008  FINDINGS:  CT HEAD FINDINGS  Moderate atrophy. Chronic microvascular ischemic changes in the white matter.  Negative for acute infarct, hemorrhage, or mass lesion. Negative for skull fracture.  CT CERVICAL SPINE FINDINGS  Multilevel degenerative change in the cervical spine. There is disc and facet degeneration diffusely. 2 mm anterior slip at C3-4 consistent with disc and facet degeneration. There is a central disc protrusion at C3-4. 2 mm anterior slip C7-T1 also appears degenerative. Spondylosis most prominent at C4-5 and C5-6 and C6-7. Multilevel foraminal encroachment secondary to spurring.  Negative for cervical spine fracture.  IMPRESSION: Atrophy and chronic microvascular ischemic change. No acute intracranial abnormality.  Cervical degenerative changes, moderate to advanced in degree. Negative for cervical spine fracture.   Electronically Signed   By: Marlan Palauharles  Clark M.D.   On: 02/04/2014 12:31   Ct Cervical Spine Wo Contrast  02/04/2014   CLINICAL DATA:  Fall.  Altered level of consciousness.  EXAM: CT HEAD WITHOUT CONTRAST  CT CERVICAL SPINE WITHOUT CONTRAST  TECHNIQUE: Multidetector CT imaging of the head and cervical spine was performed following the standard protocol without intravenous contrast. Multiplanar CT image reconstructions of the cervical spine were also generated.  COMPARISON:  CT head 05/18/2008  FINDINGS: CT HEAD FINDINGS  Moderate atrophy. Chronic microvascular ischemic changes in the white matter.  Negative for acute infarct, hemorrhage, or mass lesion. Negative for skull fracture.  CT CERVICAL SPINE FINDINGS  Multilevel degenerative change in the cervical spine. There is disc and facet degeneration diffusely. 2 mm anterior slip at C3-4 consistent with disc and facet degeneration. There is a central disc protrusion at C3-4. 2 mm anterior slip C7-T1 also appears degenerative. Spondylosis most prominent at C4-5 and C5-6 and C6-7. Multilevel foraminal encroachment secondary to spurring.  Negative  for cervical spine fracture.  IMPRESSION: Atrophy and chronic microvascular ischemic change. No acute intracranial abnormality.  Cervical degenerative changes, moderate to advanced in degree. Negative for cervical spine fracture.   Electronically Signed   By: Marlan Palau M.D.   On: 02/04/2014 12:31   Dg Pelvis Portable  02/04/2014   CLINICAL DATA:  Fall yesterday, patient found in the floor earlier today complaining of pelvic pain.  EXAM: PORTABLE PELVIS 1-2 VIEWS  COMPARISON:  AP pelvis and left hip x-rays 04/10/2013, 12/11/2009.  FINDINGS: No acute fractures involving the bony pelvis or the proximal femora. Prior right hip arthroplasty with anatomic alignment. Prior ORIF of an intertrochanteric left femoral neck fracture with healing. Generalized osseous demineralization. Degenerative changes involving the sacroiliac joints. Symphysis pubis intact. Degenerative changes involving the visualized lower lumbar spine.  IMPRESSION: No acute osseous abnormality.   Electronically Signed   By: Hulan Saas M.D.   On: 02/04/2014 12:17   Dg Chest Portable 1 View  02/04/2014   CLINICAL DATA:  Fall  EXAM: PORTABLE CHEST - 1 VIEW  COMPARISON:  10/17/2010  FINDINGS: Cardiac enlargement without heart failure.  Lungs are clear.  IMPRESSION: No active disease.   Electronically Signed   By: Marlan Palau M.D.   On: 02/04/2014 12:15    EKG: Independently reviewed sinus rhythm with PACs. Monitor at time of my exam reveals atrial fibrillation.  Assessment/Plan Principal Problem:   Acute encephalopathy: Baseline is alert and oriented x3. May be multifactorial i.e. hypertensive encephalopathy versus infectious process as well as concern regarding atrial fibrillation. Admit to step down. Will control blood pressure by resuming her home clonidine and initiating a labetalol drip. Treat urinary tract infection with Rocephin. Will obtain an MRI of the brain and a 2-D echo. We'll cycle cardiac enzymes. Active  Problems: Hypertensive urgency: Likely related to missing clonidine dose resulting in rebound. It is unclear how long patient was on the floor and how many doses of her clonidine she missed. Will continue her home clonidine 3 times a day and initiate labetalol drip. As her blood pressure becomes controlled wean the labetalol. A-fib: No history of atrial fibrillation. Will obtain a 2-D echo. Will repeat 12-lead EKG. Check TSH and magnesium level. Potassium currently 3.4 will replete. We'll cycle cardiac enzymes. See #2. Patient not on a beta blocker prior to admission.  UTI (lower urinary tract infection): Continue Rocephin. She is afebrile with no leukocytosis. Will await cultures.      Acute on chronic renal failure: Likely related to decreased by mouth intake given it is unclear how long she was on the floor. Will gently hydrate.  Will hold nephrotoxins. Will recheck in the a.m.    GERD (gastroesophageal reflux disease): Appears stable at baseline. Continue PPI    HTN (hypertension): See #2.    Thyroid disease: We'll check a TSH. Continue home meds    DVT (deep venous thrombosis): Approximately 5 years ago. She was on anticoagulation but that has been discontinued. 2 status post filter      Code Status: DNR Family Communication: son in law at bedside Disposition Plan: may need placement.   Time spent: 70 minutes  Lesle Chris [facility] Pager [phone]

## 2014-02-04 NOTE — H&P (Signed)
Patient seen and examined. Above note reviewed.  Patient has been admitted to the hospital with altered mental status. She was found to be severely hypertensive, with probable hypertensive encephalopathy. She was also found to have a urinary tract infection. 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 This is likely related to her severe hypertension. Patient will be placed on full dose aspirin. Further workup including carotid Dopplers, echocardiogram will be obtained. We'll check hemoglobin A1c and fasting lipid panel. She is not felt to be candidate for TPA since times since onset of symptoms is not known. Patient was also noted to have atrial fibrillation. This is new onset. She'll be placed on rate control medications. She does not appear to be a candidate for anticoagulation. She was noted to have a DVT in the past and has an IVC filter placed. Her family reports that she frequently falls. She was placed on aspirin. We'll request physical therapy consultation.  Darden RestaurantsJehanzeb Memon

## 2014-02-04 NOTE — ED Notes (Addendum)
When caretaker went to check on her this morning.  They found her in the kitchen floor.  Pt does not remember what happened.  Denies any pain.  No visible injury at this time. cbg 132.  B/p with ems was 210/122.  Was in a fib with ems.  Pt not immobilized when she came in.

## 2014-02-04 NOTE — ED Provider Notes (Signed)
CSN: [id number]     Arrival date & time 02/04/14  1027 History   This chart was scribed for Benny Lennert, MD by Bronson Curb, ED Scribe. This patient was seen in room APA10/APA10 and the patient's care was started at 11:03 AM.    Chief Complaint  Patient presents with  . Fall   LEVEL 5 CAVEAT-CONFUSION Patient is a 80 y.o. female presenting with fall. The history is provided by the patient and a relative. No language interpreter was used.  Fall This is a new problem. The current episode started 6 to 12 hours ago. The problem occurs rarely. The problem has been resolved. Nothing aggravates the symptoms. Nothing relieves the symptoms. She has tried nothing for the symptoms.  HPI Comments: Tracy Walker is a 80 y.o. female who presents to the Emergency Department complaining of fall that occurred this morning. Patient does not recall what happened to cause the fall. Family member states patient was found on the kitchen floor this morning around 9 am. Family member states the police department calls her every morning but they were unable to reach her today. He states his wife checks on her several times per day and found her on the floor upon arriving to her home. Patient denies any pain.     Past Medical History  Diagnosis Date  . GERD (gastroesophageal reflux disease)   . Hypertension   . Thyroid disease     subclinical hypothyroidism  . DVT (deep venous thrombosis)   . Overactive bladder    Past Surgical History  Procedure Laterality Date  . Total hip arthroplasty      right  . Total knee arthroplasty      Left  . Abdominal hysterectomy    . Ivc filter     History reviewed. No pertinent family history. History  Substance Use Topics  . Smoking status: Former Games developer  . Smokeless tobacco: Not on file  . Alcohol Use: No   OB History   Grav Para Term Preterm Abortions TAB SAB Ect Mult Living                 Review of Systems  Unable to perform ROS: Other       Allergies  Review of patient's allergies indicates no known allergies.  Home Medications   Current Outpatient Rx  Name  Route  Sig  Dispense  Refill  . aspirin EC 81 MG tablet   Oral   Take 81 mg by mouth at bedtime.         . cloNIDine (CATAPRES) 0.2 MG tablet   Oral   Take 1 tablet (0.2 mg total) by mouth 3 (three) times daily.   270 tablet   1   . furosemide (LASIX) 20 MG tablet   Oral   Take 1 tablet (20 mg total) by mouth 2 (two) times daily.   180 tablet   1   . Multiple Vitamin (MULTIVITAMIN WITH MINERALS) TABS tablet   Oral   Take 1 tablet by mouth at bedtime.         . tolterodine (DETROL LA) 4 MG 24 hr capsule   Oral   Take 1 capsule (4 mg total) by mouth daily.   90 capsule   1    BP 212/98  Pulse 98  Temp(Src) 99.3 F (37.4 C) (Rectal)  Resp 18  SpO2 92% Physical Exam  Nursing note and vitals reviewed. Constitutional: She appears well-developed.  HENT:  Head: Normocephalic.  Eyes: Conjunctivae and EOM are normal. No scleral icterus.  Neck: Neck supple. No thyromegaly present.  Cardiovascular: Normal rate, regular rhythm and normal heart sounds.  Exam reveals no gallop and no friction rub.   No murmur heard. Pulmonary/Chest: Effort normal and breath sounds normal. No stridor. No respiratory distress. She has no wheezes. She has no rales. She exhibits no tenderness.  Abdominal: Soft. She exhibits no distension. There is no tenderness. There is no rebound.  Musculoskeletal: Normal range of motion. She exhibits no edema.       Right knee: Tenderness found.       Left knee: Tenderness found.  Tenderness to bilateral knees.  Lymphadenopathy:    She has no cervical adenopathy.  Neurological: She is alert. She is disoriented (Oriented only to person.). She exhibits normal muscle tone. Coordination normal.  Skin: No rash noted. No erythema.  Psychiatric: She has a normal mood and affect. Her behavior is normal.    ED Course  Procedures  (including critical care time) DIAGNOSTIC STUDIES: Oxygen Saturation is 92% on RA, adequate by my interpretation.    COORDINATION OF CARE: 11:07 AM Will order X-rays. Pt and family member advised of plan for treatment and pt agrees.  Labs Review Labs Reviewed  CBC WITH DIFFERENTIAL - Abnormal; Notable for the following:    Neutrophils Relative % 82 (*)    Neutro Abs 8.4 (*)    Lymphocytes Relative 8 (*)    All other components within normal limits  COMPREHENSIVE METABOLIC PANEL - Abnormal; Notable for the following:    Potassium 3.4 (*)    Glucose, Bld 121 (*)    Creatinine, Ser 1.26 (*)    GFR calc non Af Amer 38 (*)    GFR calc Af Amer 44 (*)    All other components within normal limits  URINALYSIS, ROUTINE W REFLEX MICROSCOPIC - Abnormal; Notable for the following:    Hgb urine dipstick MODERATE (*)    Protein, ur 30 (*)    Nitrite POSITIVE (*)    Leukocytes, UA SMALL (*)    All other components within normal limits  CK - Abnormal; Notable for the following:    Total CK 320 (*)    All other components within normal limits  URINE MICROSCOPIC-ADD ON - Abnormal; Notable for the following:    Bacteria, UA MANY (*)    All other components within normal limits  TROPONIN I   Imaging Review Ct Head Wo Contrast  02/04/2014   CLINICAL DATA:  Fall.  Altered level of consciousness.  EXAM: CT HEAD WITHOUT CONTRAST  CT CERVICAL SPINE WITHOUT CONTRAST  TECHNIQUE: Multidetector CT imaging of the head and cervical spine was performed following the standard protocol without intravenous contrast. Multiplanar CT image reconstructions of the cervical spine were also generated.  COMPARISON:  CT head 05/18/2008  FINDINGS: CT HEAD FINDINGS  Moderate atrophy. Chronic microvascular ischemic changes in the white matter.  Negative for acute infarct, hemorrhage, or mass lesion. Negative for skull fracture.  CT CERVICAL SPINE FINDINGS  Multilevel degenerative change in the cervical spine. There is disc and  facet degeneration diffusely. 2 mm anterior slip at C3-4 consistent with disc and facet degeneration. There is a central disc protrusion at C3-4. 2 mm anterior slip C7-T1 also appears degenerative. Spondylosis most prominent at C4-5 and C5-6 and C6-7. Multilevel foraminal encroachment secondary to spurring.  Negative for cervical spine fracture.  IMPRESSION: Atrophy and chronic microvascular ischemic change. No acute intracranial abnormality.  Cervical degenerative  changes, moderate to advanced in degree. Negative for cervical spine fracture.   Electronically Signed   By: Marlan Palau M.D.   On: 02/04/2014 12:31   Ct Cervical Spine Wo Contrast  02/04/2014   CLINICAL DATA:  Fall.  Altered level of consciousness.  EXAM: CT HEAD WITHOUT CONTRAST  CT CERVICAL SPINE WITHOUT CONTRAST  TECHNIQUE: Multidetector CT imaging of the head and cervical spine was performed following the standard protocol without intravenous contrast. Multiplanar CT image reconstructions of the cervical spine were also generated.  COMPARISON:  CT head 05/18/2008  FINDINGS: CT HEAD FINDINGS  Moderate atrophy. Chronic microvascular ischemic changes in the white matter.  Negative for acute infarct, hemorrhage, or mass lesion. Negative for skull fracture.  CT CERVICAL SPINE FINDINGS  Multilevel degenerative change in the cervical spine. There is disc and facet degeneration diffusely. 2 mm anterior slip at C3-4 consistent with disc and facet degeneration. There is a central disc protrusion at C3-4. 2 mm anterior slip C7-T1 also appears degenerative. Spondylosis most prominent at C4-5 and C5-6 and C6-7. Multilevel foraminal encroachment secondary to spurring.  Negative for cervical spine fracture.  IMPRESSION: Atrophy and chronic microvascular ischemic change. No acute intracranial abnormality.  Cervical degenerative changes, moderate to advanced in degree. Negative for cervical spine fracture.   Electronically Signed   By: Marlan Palau M.D.   On:  02/04/2014 12:31   Dg Pelvis Portable  02/04/2014   CLINICAL DATA:  Fall yesterday, patient found in the floor earlier today complaining of pelvic pain.  EXAM: PORTABLE PELVIS 1-2 VIEWS  COMPARISON:  AP pelvis and left hip x-rays 04/10/2013, 12/11/2009.  FINDINGS: No acute fractures involving the bony pelvis or the proximal femora. Prior right hip arthroplasty with anatomic alignment. Prior ORIF of an intertrochanteric left femoral neck fracture with healing. Generalized osseous demineralization. Degenerative changes involving the sacroiliac joints. Symphysis pubis intact. Degenerative changes involving the visualized lower lumbar spine.  IMPRESSION: No acute osseous abnormality.   Electronically Signed   By: Hulan Saas M.D.   On: 02/04/2014 12:17   Dg Chest Portable 1 View  02/04/2014   CLINICAL DATA:  Fall  EXAM: PORTABLE CHEST - 1 VIEW  COMPARISON:  10/17/2010  FINDINGS: Cardiac enlargement without heart failure.  Lungs are clear.  IMPRESSION: No active disease.   Electronically Signed   By: Marlan Palau M.D.   On: 02/04/2014 12:15     EKG Interpretation None      MDM   Final diagnoses:  None    The chart was scribed for me under my direct supervision.  I personally performed the history, physical, and medical decision making and all procedures in the evaluation of this patient.Benny Lennert, MD 02/04/14 [phone]

## 2014-02-04 NOTE — Progress Notes (Signed)
Notified Dr. Welton FlakesKhan of MRI results, no new orders at this time.  Continue to monitor neuro status.  Patient resting at present. Patient remains oriented x 2. Moves all extremities. Follows commands. Labetalol gtt on hold for BP of 96/46 and HR of 62.  Turned off at 2020.  At present BP is 111/50 HR 69.  Reported gtt held to MD at time of MRI report. Patient is also positive for MRSA.   Monitoring closely

## 2014-02-04 NOTE — Progress Notes (Signed)
ANTIBIOTIC CONSULT NOTE - INITIAL  Pharmacy Consult for Rocephin Indication: UTI  No Known Allergies  Patient Measurements:   Adjusted Body Weight:   Vital Signs: Temp: 98.9 F (37.2 C) (04/10 1446) Temp src: Oral (04/10 1434) BP: 246/76 mmHg (04/10 1549) Pulse Rate: 95 (04/10 1549) Intake/Output from previous day:   Intake/Output from this shift: Total I/O In: -  Out: 828 [Urine:828]  Labs:  Recent Labs  02/04/14 1122  WBC 10.2  HGB 14.8  PLT 256  CREATININE 1.26*   The CrCl is unknown because both a height and weight (above a minimum accepted value) are required for this calculation. No results found for this basename: VANCOTROUGH, VANCOPEAK, VANCORANDOM, GENTTROUGH, GENTPEAK, GENTRANDOM, TOBRATROUGH, TOBRAPEAK, TOBRARND, AMIKACINPEAK, AMIKACINTROU, AMIKACIN,  in the last 72 hours   Microbiology: No results found for this or any previous visit (from the past 720 hour(s)).  Medical History: Past Medical History  Diagnosis Date  . GERD (gastroesophageal reflux disease)   . Hypertension   . Thyroid disease     subclinical hypothyroidism  . DVT (deep venous thrombosis)   . Overactive bladder   . CKD (chronic kidney disease)     Medications:  Scheduled:  . aspirin EC  81 mg Oral QHS  . [START ON 02/05/2014] cefTRIAXone (ROCEPHIN)  IV  1 g Intravenous Q24H  . cloNIDine  0.2 mg Oral TID  . enoxaparin (LOVENOX) injection  40 mg Subcutaneous Q24H  . fesoterodine  4 mg Oral Daily  . multivitamin with minerals  1 tablet Oral QHS  . pantoprazole  40 mg Oral Daily   Assessment: Rocephin 1 GM IV given in ED today Continue on admission for UTI  Goal of Therapy:  Eradicate infection   Plan:  Rocephin 1 GM IV every 24 hours, next dose tomorrow at 2 PM Labs per protocol F/U cultures  Liannah Yarbough [employer] CorporationBennett Tallulah Hosman 02/04/2014,5:10 PM

## 2014-02-04 NOTE — Progress Notes (Signed)
CRITICAL VALUE ALERT  Critical value received:  Troponin 0.31  Date of notification:  02/04/14  Time of notification:  2125  Critical value read back: yes  Nurse who received alert:  Foye Deer. Carolann Brazell RN  MD notified (1st page):  Benedetto Coons. Callahan  Time of first page:  2130  MD notified (2nd page):  Time of second page:  Responding MD:  Benedetto Coons. Callahan  Time MD responded: 2135

## 2014-02-05 ENCOUNTER — Inpatient Hospital Stay (HOSPITAL_COMMUNITY): Payer: Medicare HMO

## 2014-02-05 DIAGNOSIS — I359 Nonrheumatic aortic valve disorder, unspecified: Secondary | ICD-10-CM

## 2014-02-05 LAB — TSH: TSH: 2.98 u[IU]/mL (ref 0.350–4.500)

## 2014-02-05 LAB — LIPID PANEL
CHOL/HDL RATIO: 3.7 ratio
CHOLESTEROL: 133 mg/dL (ref 0–200)
HDL: 36 mg/dL — ABNORMAL LOW (ref >39)
LDL Cholesterol: 75 mg/dL (ref 0–99)
Triglycerides: 110 mg/dL (ref <150)
VLDL: 22 mg/dL (ref 0–40)

## 2014-02-05 LAB — HEMOGLOBIN A1C
Hgb A1c MFr Bld: 5.6 % (ref <5.7)
Mean Plasma Glucose: 114 mg/dL (ref <117)

## 2014-02-05 LAB — TROPONIN I: Troponin I: <0.3 ng/mL (ref <0.30)

## 2014-02-05 MED ORDER — HYDRALAZINE HCL 20 MG/ML IJ SOLN
10.0000 mg | Freq: Four times a day (QID) | INTRAMUSCULAR | Status: DC | PRN
  Administered 2014-02-05 – 2014-02-06 (×3): 10 mg via INTRAVENOUS
  Filled 2014-02-05 (×3): qty 1

## 2014-02-05 NOTE — Progress Notes (Signed)
  Echocardiogram 2D Echocardiogram has been performed.  Tracy Walker 02/05/2014, 11:35 AM

## 2014-02-05 NOTE — Plan of Care (Signed)
Problem: Phase I Progression Outcomes Goal: Pain controlled with appropriate interventions Outcome: Not Applicable Date Met:  67/20/91 No complaints of pain Goal: OOB as tolerated unless otherwise ordered Outcome: Progressing Patient on bedrest tonight, neuro checks Q4 hrs. Goal: Vital Signs stable- temperature less than 102 Outcome: Progressing BP has decreased since patient drifted off to sleep.

## 2014-02-05 NOTE — Progress Notes (Signed)
[facility] PROGRESS NOTE  Tracy Walker EXB:[id number] DOB: 09/18/1928 DOA: 02/04/2014 PCP: Rudi HeapMOORE, DONALD, MD  Assessment/Plan: 1. Acute CVA. Likely related to malignant hypertension. Patient was found to have an acute infarct in the right caudate. Aspirin has been increased to 325 mg daily. She will undergo echocardiogram and carotid Dopplers. LDL was found to be less than 100. Hemoglobin A1c is in process. She has passed her swallow screen. Physical therapy consultation is pending. 2. Malignant hypertension. Patient was initially started on labetalol infusion. Shortly after, her blood pressure improved and she was weaned off. She is currently not on any antihypertensives her blood pressure is normotensive. We will continue monitor and adjust medications accordingly. 3. Transient atrial fibrillation. On admission, he was noted the patient had atrial fibrillation. She has since converted to sinus rhythm. TSH was normal. She does not appear to be a candidate for anticoagulation due to history of frequent falls. She also has a history of DVT in the past and has an IVC filter placed for. Family could not tell me exact details as to why anticoagulation was not used. I suspect it may be due to her high risk of falls. 4. UTI. Continue Rocephin and follow up urine Culture 5. Mild troponin leak. Likely related to severe hypertension. Echocardiogram is pending. No further workup. She's not had any chest pain  Code Status: DNR Family Communication: discussed with son over the phone Disposition Plan: pending hospital course   Consultants:    Procedures:    Antibiotics:  Rocephin 4/10  HPI/Subjective: Confused. Does not appear to be in distress  Objective: Filed Vitals:   02/05/14 0730  BP:   Pulse:   Temp: 97.9 F (36.6 C)  Resp:     Intake/Output Summary (Last 24 hours) at 02/05/14 0946 Last data filed at 02/05/14 0600  Gross per 24 hour  Intake     11 ml  Output   1078 ml   Net  -1067 ml   Filed Weights   02/04/14 1700  Weight: 66.2 kg (145 lb 15.1 oz)    Exam:   General:  NAD  Cardiovascular: S1, S2 RRR  Respiratory: CTA B  Abdomen: soft, nt, nd, bs+  Musculoskeletal: no edema b/l  Neuro: no facial asymmetry, strength is 5/5 bilaterally in upper extremities, 3-4/5 in RLE and 3/5 in LLE. She remains confused   Data Reviewed: Basic Metabolic Panel:  Recent Labs Lab 02/04/14 1122  NA 142  K 3.4*  CL 101  CO2 27  GLUCOSE 121*  BUN 18  CREATININE 1.26*  CALCIUM 9.6  MG 2.2   Liver Function Tests:  Recent Labs Lab 02/04/14 1122  AST 36  ALT 15  ALKPHOS 57  BILITOT 0.8  PROT 7.7  ALBUMIN 3.6   No results found for this basename: LIPASE, AMYLASE,  in the last 168 hours No results found for this basename: AMMONIA,  in the last 168 hours CBC:  Recent Labs Lab 02/04/14 1122  WBC 10.2  NEUTROABS 8.4*  HGB 14.8  HCT 43.3  MCV 85.1  PLT 256   Cardiac Enzymes:  Recent Labs Lab 02/04/14 1122 02/04/14 1957 02/05/14 0543  CKTOTAL 320*  --   --   TROPONINI <0.30 0.31* <0.30   BNP (last 3 results) No results found for this basename: PROBNP,  in the last 8760 hours CBG: No results found for this basename: GLUCAP,  in the last 168 hours  Recent Results (from the past 240 hour(s))  MRSA PCR  SCREENING     Status: Abnormal   Collection Time    02/04/14  5:07 PM      Result Value Ref Range Status   MRSA by PCR POSITIVE (*) NEGATIVE Final   Comment:            The GeneXpert MRSA Assay (FDA     approved for NASAL specimens     only), is one component of a     comprehensive MRSA colonization     surveillance program. It is not     intended to diagnose MRSA     infection nor to guide or     monitor treatment for     MRSA infections.     CRITICAL RESULT CALLED TO, READ BACK BY AND VERIFIED WITH:     L.KOGER AT 1928 ON 02/04/14 BY S.VANHOORNE     Studies: Ct Head Wo Contrast  02/04/2014   CLINICAL DATA:  Fall.   Altered level of consciousness.  EXAM: CT HEAD WITHOUT CONTRAST  CT CERVICAL SPINE WITHOUT CONTRAST  TECHNIQUE: Multidetector CT imaging of the head and cervical spine was performed following the standard protocol without intravenous contrast. Multiplanar CT image reconstructions of the cervical spine were also generated.  COMPARISON:  CT head 05/18/2008  FINDINGS: CT HEAD FINDINGS  Moderate atrophy. Chronic microvascular ischemic changes in the white matter.  Negative for acute infarct, hemorrhage, or mass lesion. Negative for skull fracture.  CT CERVICAL SPINE FINDINGS  Multilevel degenerative change in the cervical spine. There is disc and facet degeneration diffusely. 2 mm anterior slip at C3-4 consistent with disc and facet degeneration. There is a central disc protrusion at C3-4. 2 mm anterior slip C7-T1 also appears degenerative. Spondylosis most prominent at C4-5 and C5-6 and C6-7. Multilevel foraminal encroachment secondary to spurring.  Negative for cervical spine fracture.  IMPRESSION: Atrophy and chronic microvascular ischemic change. No acute intracranial abnormality.  Cervical degenerative changes, moderate to advanced in degree. Negative for cervical spine fracture.   Electronically Signed   By: Marlan Palau M.D.   On: 02/04/2014 12:31   Ct Cervical Spine Wo Contrast  02/04/2014   CLINICAL DATA:  Fall.  Altered level of consciousness.  EXAM: CT HEAD WITHOUT CONTRAST  CT CERVICAL SPINE WITHOUT CONTRAST  TECHNIQUE: Multidetector CT imaging of the head and cervical spine was performed following the standard protocol without intravenous contrast. Multiplanar CT image reconstructions of the cervical spine were also generated.  COMPARISON:  CT head 05/18/2008  FINDINGS: CT HEAD FINDINGS  Moderate atrophy. Chronic microvascular ischemic changes in the white matter.  Negative for acute infarct, hemorrhage, or mass lesion. Negative for skull fracture.  CT CERVICAL SPINE FINDINGS  Multilevel degenerative  change in the cervical spine. There is disc and facet degeneration diffusely. 2 mm anterior slip at C3-4 consistent with disc and facet degeneration. There is a central disc protrusion at C3-4. 2 mm anterior slip C7-T1 also appears degenerative. Spondylosis most prominent at C4-5 and C5-6 and C6-7. Multilevel foraminal encroachment secondary to spurring.  Negative for cervical spine fracture.  IMPRESSION: Atrophy and chronic microvascular ischemic change. No acute intracranial abnormality.  Cervical degenerative changes, moderate to advanced in degree. Negative for cervical spine fracture.   Electronically Signed   By: Marlan Palau M.D.   On: 02/04/2014 12:31   Mr Brain Wo Contrast  02/04/2014   CLINICAL DATA:  Fall.  Confusion.  Hypertension.  EXAM: MRI HEAD WITHOUT CONTRAST  TECHNIQUE: Multiplanar, multiecho pulse sequences of the brain  and surrounding structures were obtained without intravenous contrast.  COMPARISON:  02/04/2014 CT.  FINDINGS: Small acute nonhemorrhagic right caudate head infarct.  Right occipital lobe small infarct with minimal amount of blood breakdown products may represent a subacute infarct.  Remote left thalamic infarct.  Prominent small vessel disease type changes.  Scattered blood breakdown products throughout the supratentorial and infratentorial region. This may reflect changes of hemorrhagic ischemia. Episodes of prior trauma may also contribute to these areas of blood breakdown products. Cavernomas felt unlikely  No intracranial mass lesion noted on this unenhanced exam.  Major intracranial vascular structures are patent.  Cervical medullary junction, pituitary region, pineal region and orbital structures unremarkable.  Minimal paranasal sinus mucosal thickening.  IMPRESSION: Small acute nonhemorrhagic right caudate head infarct.  Right occipital lobe small infarct with minimal amount of blood breakdown products may represent a subacute infarct.  Remote left thalamic infarct.   Prominent small vessel disease type changes.  Scattered blood breakdown products throughout the supratentorial and infratentorial region. This may reflect changes of hemorrhagic ischemia. Episodes of prior trauma could conceivably contribute to this finding.  These results were called by telephone at the time of interpretation on 02/04/2014 at 6:56 PM to [facility] patients nurse, who verbally acknowledged these results.   Electronically Signed   By: Bridgett Larsson M.D.   On: 02/04/2014 18:59   Dg Pelvis Portable  02/04/2014   CLINICAL DATA:  Fall yesterday, patient found in the floor earlier today complaining of pelvic pain.  EXAM: PORTABLE PELVIS 1-2 VIEWS  COMPARISON:  AP pelvis and left hip x-rays 04/10/2013, 12/11/2009.  FINDINGS: No acute fractures involving the bony pelvis or the proximal femora. Prior right hip arthroplasty with anatomic alignment. Prior ORIF of an intertrochanteric left femoral neck fracture with healing. Generalized osseous demineralization. Degenerative changes involving the sacroiliac joints. Symphysis pubis intact. Degenerative changes involving the visualized lower lumbar spine.  IMPRESSION: No acute osseous abnormality.   Electronically Signed   By: Hulan Saas M.D.   On: 02/04/2014 12:17   Dg Chest Portable 1 View  02/04/2014   CLINICAL DATA:  Fall  EXAM: PORTABLE CHEST - 1 VIEW  COMPARISON:  10/17/2010  FINDINGS: Cardiac enlargement without heart failure.  Lungs are clear.  IMPRESSION: No active disease.   Electronically Signed   By: Marlan Palau M.D.   On: 02/04/2014 12:15    Scheduled Meds: . aspirin EC  325 mg Oral QHS  . cefTRIAXone (ROCEPHIN)  IV  1 g Intravenous Q24H  . Chlorhexidine Gluconate Cloth  6 each Topical Q0600  . enoxaparin (LOVENOX) injection  40 mg Subcutaneous Q24H  . fesoterodine  4 mg Oral Daily  . multivitamin with minerals  1 tablet Oral QHS  . mupirocin ointment  1 application Nasal BID  . pantoprazole  40 mg Oral Daily   Continuous  Infusions:   Principal Problem:   Acute encephalopathy Active Problems:   GERD (gastroesophageal reflux disease)   HTN (hypertension)   Thyroid disease   DVT (deep venous thrombosis)   UTI (lower urinary tract infection)   Hypertensive urgency   Acute on chronic renal failure   A-fib   CVA (cerebral infarction)    Time spent:    [facility]  [facility] Pager [phone]. If 7PM-7AM, please contact night-coverage at [email or web address], password [facility] 02/05/2014, 9:46 AM  LOS: 1 day

## 2014-02-05 NOTE — Progress Notes (Signed)
Utilization review Completed Marciel Offenberger RN BSN   

## 2014-02-05 NOTE — Evaluation (Signed)
Physical Therapy Evaluation Patient Details Name: Tracy Walker MRN: [id number] DOB: 10/24/1928 Today's Date: 02/05/2014   History of Present Illness  80 y.o. female with beleived to have experienced an acute infarct. Past medical history that includes hypertension, DVT no longer on anticoagulation and has a filter, thyroid disease, chronic kidney disease, presents to the emergency department with the chief complaint of fall. Information is obtained from the son who is at the bedside as patient is currently confused. Patient lives alone but is checked on frequently by family members and daily by police department. Family member reports that the police department calls the patient every morning and this morning she did not answer the phone. Son states that he spoke to the patient last evening around 9 PM and noted nothing out of the ordinary. When the family went to check on the patient this morning around on a clock they found her on the floor. Patient has no recollection of when she fell or how she fell were the circumstances prior to her falling. There has been no recent illness fever or chills nausea vomiting diarrhea. She has been eating and drinking her normal amount and there has been no unintentional weight loss. She does have a history of falling and is not steady on her feet do to a history of hip and knee replacements. Initial workup reveals hypertension at 228/122, EKG with SR with PAC's, creatinine 1.26 urinary tract infection. CT of the head cervical spine without acute abnormality. Chest x-ray and pelvis x-ray without acute abnormality.   Clinical Impression  Patient sitting up in Bed on arrival in good mood ready to perform therapy, though she wanted to finish her dinner while it was hot. Despite recent diagnosis of CVA patient displays no abnormal UE or LE weakness or change in sensation outside of normal deconditioning following extended (>24hours) bed rest. Upon completing meal patient  demonstrated Independence with all bed mobility, and transfers requiring supervision during gait in room due to patient noting decreased strength in her legs and being able to tolerate only minor walking challenges including unable to perform changes in gait speed and patient only able to walk 15 ft. Expecting patient to be safe to Discharge home with Home health PT to return to prior level of function including independently performing all in home ADL's independently. PT will continue to see patient during current hospital stay.     Follow Up Recommendations Home health PT    Equipment Recommendations  None recommended by PT       Precautions / Restrictions Precautions Precautions: Fall Precaution Comments: due to decreased strength Restrictions Weight Bearing Restrictions: No      Mobility  Bed Mobility Overal bed mobility: Independent                Transfers Overall transfer level: Independent                  Ambulation/Gait Ambulation/Gait assistance: Supervision Ambulation Distance (Feet): 15 Feet Assistive device: Rolling walker (2 wheeled) Gait Pattern/deviations: Decreased stride length   Gait velocity interpretation: Below normal speed for age/gender      Wheelchair Mobility    Modified Rankin (Stroke Patients Only)       Balance Overall balance assessment: Needs assistance Sitting-balance support: No upper extremity supported Sitting balance-Leahy Scale: Normal     Standing balance support: Bilateral upper extremity supported Standing balance-Leahy Scale: Fair  Pertinent Vitals/Pain No pain    Home Living Family/patient expects to be discharged to:: Private residence Living Arrangements: Alone Available Help at Discharge: Family Type of Home: House Home Access: Level entry     Home Layout: One level Home Equipment: Environmental consultant - 4 wheels      Prior Function Level of Independence:  Independent with assistive device(s)         Comments: Patient's daughter-in0law would drive her for short errands, she primarily performed all in home ADLs independently     Extremity/Trunk Assessment   Upper Extremity Assessment: Overall WFL for tasks assessed           Lower Extremity Assessment: Overall WFL for tasks assessed;Generalized weakness (general LE weakness limited walkign endurance. )      Cervical / Trunk Assessment: Kyphotic  Communication   Communication: No difficulties  Cognition Arousal/Alertness: Awake/alert Behavior During Therapy: WFL for tasks assessed/performed Overall Cognitive Status: Within Functional Limits for tasks assessed                         Exercises Other Exercises Other Exercises: Bridges 10x in supine  Other Exercises: Ankle pumps 10x in supine         PT Assessment Patient needs continued PT services  PT Diagnosis Difficulty walking;Generalized weakness   PT Problem List Decreased strength;Decreased activity tolerance;Decreased mobility  PT Treatment Interventions Gait training;Stair training;Functional mobility training;Therapeutic exercise;Therapeutic activities;Balance training;Patient/family education   PT Goals (Current goals can be found in the Care Plan section) Acute Rehab PT Goals Patient Stated Goal: to return home, and to be able to walk 15ft PT Goal Formulation: With patient Time For Goal Achievement: 02/12/14 Potential to Achieve Goals: Good    Frequency Min 3X/week (Should be seen on Monday 02/07/14)           End of Session Equipment Utilized During Treatment: Gait belt Activity Tolerance: Patient tolerated treatment well Patient left: in bed;with call bell/phone within reach;with nursing/sitter in room Nurse Communication: Mobility status         Time: 1710-1740 PT Time Calculation (min): 30 min   Charges:   PT Evaluation $Initial PT Evaluation Tier I: 1 Procedure PT  Treatments $Gait Training: 8-22 mins $Therapeutic Exercise: 8-22 mins   PT G Codes:          Cera Rorke R Francisco Ostrovsky PT DPT 02/05/2014, 5:52 PM

## 2014-02-06 LAB — URINE CULTURE

## 2014-02-06 LAB — CBC
HCT: 42.6 % (ref 36.0–46.0)
Hemoglobin: 14.2 g/dL (ref 12.0–15.0)
MCH: 29 pg (ref 26.0–34.0)
MCHC: 33.3 g/dL (ref 30.0–36.0)
MCV: 87.1 fL (ref 78.0–100.0)
PLATELETS: 278 10*3/uL (ref 150–400)
RBC: 4.89 MIL/uL (ref 3.87–5.11)
RDW: 15.2 % (ref 11.5–15.5)
WBC: 6.9 10*3/uL (ref 4.0–10.5)

## 2014-02-06 LAB — BASIC METABOLIC PANEL
BUN: 16 mg/dL (ref 6–23)
CALCIUM: 9.3 mg/dL (ref 8.4–10.5)
CO2: 26 meq/L (ref 19–32)
Chloride: 103 mEq/L (ref 96–112)
Creatinine, Ser: 1.37 mg/dL — ABNORMAL HIGH (ref 0.50–1.10)
GFR calc Af Amer: 40 mL/min — ABNORMAL LOW (ref >90)
GFR, EST NON AFRICAN AMERICAN: 34 mL/min — AB (ref >90)
Glucose, Bld: 106 mg/dL — ABNORMAL HIGH (ref 70–99)
Potassium: 4.4 mEq/L (ref 3.7–5.3)
SODIUM: 140 meq/L (ref 137–147)

## 2014-02-06 MED ORDER — FLUCONAZOLE 100MG IVPB
100.0000 mg | INTRAVENOUS | Status: DC
  Administered 2014-02-06 – 2014-02-08 (×3): 100 mg via INTRAVENOUS
  Filled 2014-02-06 (×3): qty 50

## 2014-02-06 MED ORDER — FLUCONAZOLE IN SODIUM CHLORIDE 200-0.9 MG/100ML-% IV SOLN
INTRAVENOUS | Status: AC
  Filled 2014-02-06: qty 100

## 2014-02-06 MED ORDER — NYSTATIN 100000 UNIT/GM EX POWD
Freq: Two times a day (BID) | CUTANEOUS | Status: DC
  Administered 2014-02-07: 22:00:00 via TOPICAL
  Administered 2014-02-07: 1 via TOPICAL
  Administered 2014-02-08 – 2014-02-10 (×5): via TOPICAL
  Filled 2014-02-06 (×2): qty 15

## 2014-02-06 MED ORDER — AMLODIPINE BESYLATE 5 MG PO TABS
5.0000 mg | ORAL_TABLET | Freq: Every day | ORAL | Status: DC
  Administered 2014-02-06 – 2014-02-07 (×2): 5 mg via ORAL
  Filled 2014-02-06 (×2): qty 1

## 2014-02-06 MED ORDER — ENOXAPARIN SODIUM 30 MG/0.3ML ~~LOC~~ SOLN
30.0000 mg | SUBCUTANEOUS | Status: DC
  Administered 2014-02-06: 30 mg via SUBCUTANEOUS
  Filled 2014-02-06: qty 0.3

## 2014-02-06 MED ORDER — METOPROLOL TARTRATE 25 MG PO TABS
25.0000 mg | ORAL_TABLET | Freq: Two times a day (BID) | ORAL | Status: DC
  Administered 2014-02-06 – 2014-02-07 (×4): 25 mg via ORAL
  Filled 2014-02-06 (×4): qty 1

## 2014-02-06 NOTE — Progress Notes (Signed)
[facility] PROGRESS NOTE  Paschal DoppGarnette T Golaszewski ZOX:[id number] DOB: 08/04/1928 DOA: 02/04/2014 PCP: Rudi HeapMOORE, DONALD, MD  Assessment/Plan: 1. Acute CVA. Likely related to malignant hypertension. Patient was found to have an acute infarct in the right caudate. Aspirin has been increased to 325 mg daily. She will undergo echocardiogram and carotid Dopplers. LDL was found to be less than 100. Hemoglobin A1c is in process. She has passed her swallow screen. Physical therapy consultation recommends HHPT. Will request neurology consultation for antiplatelet regimen. 2. Malignant hypertension. Patient was initially started on labetalol infusion. Shortly after, her blood pressure improved and she was weaned off. She has now been started on lopressor. Will add norvasc. 3. Transient atrial fibrillation. On admission, he was noted the patient had atrial fibrillation. She has since converted to sinus rhythm. TSH was normal. She does not appear to be a candidate for anticoagulation due to history of frequent falls. She also has a history of DVT in the past and has an IVC filter placed for. Family could not tell me exact details as to why anticoagulation was not used. I suspect it may be due to her high risk of falls. 4. UTI. Urine culture shows no growth, will discontinue rocephin. 5. Mild troponin leak. Likely related to severe hypertension. Echocardiogram shows no WMA. No further workup. She's not had any chest pain  Code Status: DNR Family Communication: discussed with son over the phone Disposition Plan: probable discharge home in am.   Consultants:    Procedures: Echo:- Left ventricle: The cavity size was normal. There was moderate concentric hypertrophy. Systolic function was vigorous. The estimated ejection fraction was in the range of 65% to 70%. Wall motion was normal; there were no regional wall motion abnormalities. The study was not technically sufficient to allow evaluation of LV  diastolic dysfunction due to atrial fibrillation. - Aortic valve: Trileaflet; mildly thickened, mildly calcified leaflets. Transvalvular velocity was within the normal range. There was no stenosis. Mild regurgitation. - Mitral valve: Calcified annulus. Mildly thickened leaflets . No regurgitation. - Left atrium: The atrium was mildly dilated. - Right ventricle: Systolic function was normal. - Pulmonary arteries: Systolic pressure was within the normal range. - Pericardium    Antibiotics:  Rocephin 4/10>>4/12  HPI/Subjective: No new complaints  Objective: Filed Vitals:   02/06/14 1300  BP: 161/70  Pulse: 71  Temp: 98.2 F (36.8 C)  Resp: 18    Intake/Output Summary (Last 24 hours) at 02/06/14 1955 Last data filed at 02/06/14 1300  Gross per 24 hour  Intake      0 ml  Output   2425 ml  Net  -2425 ml   Filed Weights   02/04/14 1700  Weight: 66.2 kg (145 lb 15.1 oz)    Exam:   General:  NAD  Cardiovascular: S1, S2 RRR  Respiratory: CTA B  Abdomen: soft, nt, nd, bs+  Musculoskeletal: no edema b/l  Neuro: no facial asymmetry, strength is 5/5 bilaterally in upper extremities, 3-4/5 in RLE and 3/5 in LLE. She remains confused   Data Reviewed: Basic Metabolic Panel:  Recent Labs Lab 02/04/14 1122 02/06/14 0556  NA 142 140  K 3.4* 4.4  CL 101 103  CO2 27 26  GLUCOSE 121* 106*  BUN 18 16  CREATININE 1.26* 1.37*  CALCIUM 9.6 9.3  MG 2.2  --    Liver Function Tests:  Recent Labs Lab 02/04/14 1122  AST 36  ALT 15  ALKPHOS 57  BILITOT 0.8  PROT 7.7  ALBUMIN  3.6   No results found for this basename: LIPASE, AMYLASE,  in the last 168 hours No results found for this basename: AMMONIA,  in the last 168 hours CBC:  Recent Labs Lab 02/04/14 1122 02/06/14 0556  WBC 10.2 6.9  NEUTROABS 8.4*  --   HGB 14.8 14.2  HCT 43.3 42.6  MCV 85.1 87.1  PLT 256 278   Cardiac Enzymes:  Recent Labs Lab 02/04/14 1122 02/04/14 1957 02/05/14 0543   CKTOTAL 320*  --   --   TROPONINI <0.30 0.31* <0.30   BNP (last 3 results) No results found for this basename: PROBNP,  in the last 8760 hours CBG: No results found for this basename: GLUCAP,  in the last 168 hours  Recent Results (from the past 240 hour(s))  URINE CULTURE     Status: None   Collection Time    02/04/14 11:51 AM      Result Value Ref Range Status   Specimen Description URINE, CATHETERIZED   Final   Special Requests NONE   Final   Culture  Setup Time     Final   Value: 02/05/2014 01:21     Performed at [facility] Count     Final   Value: 5,000 COLONIES/ML     Performed at [facility]   Culture     Final   Value: INSIGNIFICANT GROWTH     Performed at [facility]   Report Status 02/06/2014 FINAL   Final  MRSA PCR SCREENING     Status: Abnormal   Collection Time    02/04/14  5:07 PM      Result Value Ref Range Status   MRSA by PCR POSITIVE (*) NEGATIVE Final   Comment:            The GeneXpert MRSA Assay (FDA     approved for NASAL specimens     only), is one component of a     comprehensive MRSA colonization     surveillance program. It is not     intended to diagnose MRSA     infection nor to guide or     monitor treatment for     MRSA infections.     CRITICAL RESULT CALLED TO, READ BACK BY AND VERIFIED WITH:     L.KOGER AT 1928 ON 02/04/14 BY S.VANHOORNE     Studies: Ultrasound Carotid Bilateral  02/05/2014   CLINICAL DATA:  Cerebrovascular accident.  EXAM: BILATERAL CAROTID DUPLEX ULTRASOUND  TECHNIQUE: Wallace Cullens scale imaging, color Doppler and duplex ultrasound were performed of bilateral carotid and vertebral arteries in the neck.  COMPARISON:  None.  FINDINGS: Criteria: Quantification of carotid stenosis is based on velocity parameters that correlate the residual internal carotid diameter with NASCET-based stenosis levels, using the diameter of the distal internal carotid lumen as the denominator for stenosis  measurement.  The following velocity measurements were obtained:  RIGHT  ICA:  71/15 cm/sec  CCA:  52/7 cm/sec  SYSTOLIC ICA/CCA RATIO:  1.35  DIASTOLIC ICA/CCA RATIO:  2.13  ECA:  87 cm/sec  LEFT  ICA:  78/19 cm/sec  CCA:  76/9 cm/sec  SYSTOLIC ICA/CCA RATIO:  1.04  DIASTOLIC ICA/CCA RATIO:  2.16  ECA:  76 cm/sec  RIGHT CAROTID ARTERY: Minimal plaque formation is noted and proximal right internal carotid artery consistent with less than 50% diameter stenosis based on ultrasound and Doppler criteria.  RIGHT VERTEBRAL ARTERY:  Antegrade flow is noted.  LEFT CAROTID  ARTERY: Minimal plaque formation is noted in the proximal left internal carotid artery consistent with less than 50% diameter stenosis based on ultrasound and Doppler criteria.  LEFT VERTEBRAL ARTERY:  Antegrade flow is noted.  IMPRESSION: No hemodynamically significant stenosis or plaque is noted in either cervical carotid artery.   Electronically Signed   By: Roque Lias M.D.   On: 02/05/2014 11:24    Scheduled Meds: . amLODipine  5 mg Oral Daily  . aspirin EC  325 mg Oral QHS  . Chlorhexidine Gluconate Cloth  6 each Topical Q0600  . enoxaparin (LOVENOX) injection  30 mg Subcutaneous Q24H  . fesoterodine  4 mg Oral Daily  . fluconazole (DIFLUCAN) IV  100 mg Intravenous Q24H  . metoprolol tartrate  25 mg Oral BID  . multivitamin with minerals  1 tablet Oral QHS  . mupirocin ointment  1 application Nasal BID  . nystatin   Topical BID  . pantoprazole  40 mg Oral Daily   Continuous Infusions:   Principal Problem:   Acute encephalopathy Active Problems:   GERD (gastroesophageal reflux disease)   HTN (hypertension)   Thyroid disease   DVT (deep venous thrombosis)   UTI (lower urinary tract infection)   Hypertensive urgency   Acute on chronic renal failure   A-fib   CVA (cerebral infarction)    Time spent:    [employer]  [facility] Pager [phone]. If 7PM-7AM, please contact night-coverage at  [email or web address], password [facility] 02/06/2014, 7:55 PM  LOS: 2 days

## 2014-02-06 NOTE — Progress Notes (Signed)
Notified Dr. Kerry HoughMemon of the patients severe rash noted to the entire vagina and between the buttock area.  The area is red, itchy, and has a odor.  New orders given and followed.

## 2014-02-07 ENCOUNTER — Encounter: Payer: Self-pay | Admitting: *Deleted

## 2014-02-07 ENCOUNTER — Inpatient Hospital Stay (HOSPITAL_COMMUNITY)
Admit: 2014-02-07 | Discharge: 2014-02-07 | Disposition: A | Discharge status: Home / Self Care | Payer: Medicare HMO | Attending: Neurology | Admitting: Neurology

## 2014-02-07 LAB — CBC
HCT: 44.6 % (ref 36.0–46.0)
Hemoglobin: 14.7 g/dL (ref 12.0–15.0)
MCH: 28.8 pg (ref 26.0–34.0)
MCHC: 33 g/dL (ref 30.0–36.0)
MCV: 87.3 fL (ref 78.0–100.0)
PLATELETS: 296 10*3/uL (ref 150–400)
RBC: 5.11 MIL/uL (ref 3.87–5.11)
RDW: 15.2 % (ref 11.5–15.5)
WBC: 7.6 10*3/uL (ref 4.0–10.5)

## 2014-02-07 LAB — BASIC METABOLIC PANEL
BUN: 11 mg/dL (ref 6–23)
CO2: 26 meq/L (ref 19–32)
Calcium: 9.5 mg/dL (ref 8.4–10.5)
Chloride: 104 mEq/L (ref 96–112)
Creatinine, Ser: 1.1 mg/dL (ref 0.50–1.10)
GFR calc Af Amer: 52 mL/min — ABNORMAL LOW (ref >90)
GFR calc non Af Amer: 45 mL/min — ABNORMAL LOW (ref >90)
Glucose, Bld: 122 mg/dL — ABNORMAL HIGH (ref 70–99)
Potassium: 4.1 mEq/L (ref 3.7–5.3)
SODIUM: 142 meq/L (ref 137–147)

## 2014-02-07 LAB — HOMOCYSTEINE: Homocysteine: 26.1 umol/L — ABNORMAL HIGH (ref 4.0–15.4)

## 2014-02-07 LAB — TSH: TSH: 4.72 u[IU]/mL — ABNORMAL HIGH (ref 0.350–4.500)

## 2014-02-07 LAB — VITAMIN B12: VITAMIN B 12: 920 pg/mL — AB (ref 211–911)

## 2014-02-07 MED ORDER — HYDRALAZINE HCL 25 MG PO TABS
25.0000 mg | ORAL_TABLET | Freq: Four times a day (QID) | ORAL | Status: DC | PRN
  Administered 2014-02-08 – 2014-02-09 (×3): 25 mg via ORAL
  Filled 2014-02-07 (×3): qty 1

## 2014-02-07 MED ORDER — ENOXAPARIN SODIUM 40 MG/0.4ML ~~LOC~~ SOLN
40.0000 mg | SUBCUTANEOUS | Status: DC
  Administered 2014-02-07 – 2014-02-09 (×3): 40 mg via SUBCUTANEOUS
  Filled 2014-02-07 (×3): qty 0.4

## 2014-02-07 MED ORDER — STROKE: EARLY STAGES OF RECOVERY BOOK
Freq: Once | Status: AC
  Administered 2014-02-07: 1
  Filled 2014-02-07: qty 1

## 2014-02-07 NOTE — Care Management Note (Signed)
    Page 1 of 1   02/07/2014     12:21:10 PM   CARE MANAGEMENT NOTE 02/07/2014  Patient:  Tracy Walker,Tracy Walker   Account Number:  [id number]  Date Initiated:  02/07/2014  Documentation initiated by:  Sharrie RothmanBLACKWELL,Alene Bergerson C  Subjective/Objective Assessment:   Pt admitted from home with CVA. Pt lives alone and has a son and daughter in law who are very active in the care of the pt. Pt has a rollator, elevated toilet seat, shower chair for home use. Pt has been fairly independent with ADl's prior     Action/Plan:   to CVA. PT recommends HH PT and family is inquiring about placement. Placement process explained to pts family. PT to reevaluate. Will follow for discharge planning needs.   Anticipated DC Date:  02/08/2014   Anticipated DC Plan:  HOME W HOME HEALTH SERVICES      DC Planning Services  CM consult      Choice offered to / List presented to:             Status of service:  Completed, signed off Medicare Important Message given?   (If response is "NO", the following Medicare IM given date fields will be blank) Date Medicare IM given:   Date Additional Medicare IM given:    Discharge Disposition:  HOME W HOME HEALTH SERVICES  Per UR Regulation:    If discussed at Long Length of Stay Meetings, dates discussed:    Comments:  02/07/14 1220 Arlyss Queenammy Che Rachal, RN BSN CM

## 2014-02-07 NOTE — Progress Notes (Signed)
[facility] PROGRESS NOTE  Paschal DoppGarnette T Moskal ZOX:[id number] DOB: 07/09/1928 DOA: 02/04/2014 PCP: Rudi HeapMOORE, DONALD, MD   Assessment/Plan: Acute CVA. Likely related to malignant hypertension. Patient was found to have an acute infarct in the right caudate. Aspirin has been increased to 325 mg daily. Echo with 65% EF and unable to eval LV function,  Await carotid doppler and EEG. LDL was found to be less than 100. Hemoglobin A1c 5.6. She passed her swallow screen. Physical therapy consultation recommends HHPT. await neurology consultation for antiplatelet regimen.   Malignant hypertension. Patient was initially started on labetalol infusion. Shortly after, her blood pressure improved and she was weaned off. She has now been started on lopressor and norvasc added with only fair control. Will monitor BP q4hr x3 for thoroughness.   Transient atrial fibrillation. On admission, he was noted the patient had atrial fibrillation. She has since converted to sinus rhythm. TSH was normal. She does not appear to be a candidate for anticoagulation due to history of frequent falls. She also has a history of DVT in the past and has an IVC filter placed for. Family could not tell me exact details as to why anticoagulation was not used. I suspect it may be due to her high risk of falls.   UTI. Urine culture shows no growth, given 3 days of rocephin.   Mild troponin leak. Likely related to severe hypertension. Echocardiogram shows no WMA. No further workup. She's not had any chest pai  Code Status: DNR Family Communication: son and daughter in law at bedside Disposition Plan: home likely in am   Consultants:  neurology  Procedures: Echo:- Left ventricle: The cavity size was normal. There was moderate concentric hypertrophy. Systolic function was vigorous. The estimated ejection fraction was in the range of 65% to 70%. Wall motion was normal; there were no regional wall motion abnormalities. The study was  not technically sufficient to allow evaluation of LV diastolic dysfunction due to atrial fibrillation. - Aortic valve: Trileaflet; mildly thickened, mildly calcified leaflets. Transvalvular velocity was within the normal range. There was no stenosis. Mild regurgitation. - Mitral valve: Calcified annulus. Mildly thickened leaflets . No regurgitation. - Left atrium: The atrium was mildly dilated. - Right ventricle: Systolic function was normal. - Pulmonary arteries: Systolic pressure was within the normal range. - Pericardium  Antibiotics:  Rocephin 4/10-4/12  HPI/Subjective: Awake alert to self. Denies pain/discomfort. Demonstrates expressive aphasia  Objective: Filed Vitals:   02/07/14 0903  BP: 219/97  Pulse: 87  Temp:   Resp:     Intake/Output Summary (Last 24 hours) at 02/07/14 1234 Last data filed at 02/07/14 1030  Gross per 24 hour  Intake    480 ml  Output   1000 ml  Net   -520 ml   Filed Weights   02/04/14 1700  Weight: 66.2 kg (145 lb 15.1 oz)    Exam:   General:  Well nourished NAD  Cardiovascular: RRR No MGR No LE edema  Respiratory: normal effort BS clear bilaterally no wheeze  Abdomen: obese soft +BS non-tender  Musculoskeletal: no clubbing or cyanosis.    Data Reviewed: Basic Metabolic Panel:  Recent Labs Lab 02/04/14 1122 02/06/14 0556 02/07/14 0456  NA 142 140 142  K 3.4* 4.4 4.1  CL 101 103 104  CO2 27 26 26   GLUCOSE 121* 106* 122*  BUN 18 16 11   CREATININE 1.26* 1.37* 1.10  CALCIUM 9.6 9.3 9.5  MG 2.2  --   --    Liver  Function Tests:  Recent Labs Lab 02/04/14 1122  AST 36  ALT 15  ALKPHOS 57  BILITOT 0.8  PROT 7.7  ALBUMIN 3.6   No results found for this basename: LIPASE, AMYLASE,  in the last 168 hours No results found for this basename: AMMONIA,  in the last 168 hours CBC:  Recent Labs Lab 02/04/14 1122 02/06/14 0556 02/07/14 0456  WBC 10.2 6.9 7.6  NEUTROABS 8.4*  --   --   HGB 14.8 14.2 14.7  HCT  43.3 42.6 44.6  MCV 85.1 87.1 87.3  PLT 256 278 296   Cardiac Enzymes:  Recent Labs Lab 02/04/14 1122 02/04/14 1957 02/05/14 0543  CKTOTAL 320*  --   --   TROPONINI <0.30 0.31* <0.30   BNP (last 3 results) No results found for this basename: PROBNP,  in the last 8760 hours CBG: No results found for this basename: GLUCAP,  in the last 168 hours  Recent Results (from the past 240 hour(s))  URINE CULTURE     Status: None   Collection Time    02/04/14 11:51 AM      Result Value Ref Range Status   Specimen Description URINE, CATHETERIZED   Final   Special Requests NONE   Final   Culture  Setup Time     Final   Value: 02/05/2014 01:21     [facility]   Colony Count     Final   Value: 5,000 COLONIES/ML     [facility]   Culture     Final   Value: INSIGNIFICANT GROWTH     [facility]formed at [facility]   Report Status 02/06/2014 FINAL   Final  MRSA PCR SCREENING     Status: Abnormal   Collection Time    02/04/14  5:07 PM      Result Value Ref Range Status   MRSA by PCR POSITIVE (*) NEGATIVE Final   Comment:            The GeneXpert MRSA Assay (FDA     approved for NASAL specimens     only), is one component of a     comprehensive MRSA colonization     surveillance program. It is not     intended to diagnose MRSA     infection nor to guide or     monitor treatment for     MRSA infections.     CRITICAL RESULT CALLED TO, READ BACK BY AND VERIFIED WITH:     L.KOGER AT 1928 ON 02/04/14 BY S.VANHOORNE     Studies: No results found.  Scheduled Meds: .  stroke: mapping our early stages of recovery book   Does not apply Once  . amLODipine  5 mg Oral Daily  . aspirin EC  325 mg Oral QHS  . Chlorhexidine Gluconate Cloth  6 each Topical Q0600  . enoxaparin (LOVENOX) injection  30 mg Subcutaneous Q24H  . fesoterodine  4 mg Oral Daily  . fluconazole (DIFLUCAN) IV  100 mg Intravenous Q24H  . metoprolol tartrate  25 mg Oral BID   . multivitamin with minerals  1 tablet Oral QHS  . mupirocin ointment  1 application Nasal BID  . nystatin   Topical BID  . pantoprazole  40 mg Oral Daily   Continuous Infusions:   Principal Problem:   Acute encephalopathy Active Problems:   GERD (gastroesophageal reflux disease)   HTN (hypertension)   Thyroid disease   DVT (  deep venous thrombosis)   UTI (lower urinary tract infection)   Hypertensive urgency   Acute on chronic renal failure   A-fib   CVA (cerebral infarction)    Time spent: 30 minutes    Lesle Chris [facility]  [facility] Pager [phone]. If 7PM-7AM, please contact night-coverage at [email or web address], password [facility] 02/07/2014, 12:34 PM  LOS: 3 days

## 2014-02-07 NOTE — Progress Notes (Signed)
EEG Completed; Results Pending  

## 2014-02-07 NOTE — Consult Note (Signed)
[place] A. Merlene Laughter, MD     [email or web address]          Tracy Walker is an 80 y.o. female.   ASSESSMENT/PLAN: 1. Acute on the mental status likely due to toxic metabolic encephalopathies. The patient does have small right caudate nuclei infarct but this seems to too small to cause the significant encephalopathy. There is evidence of multiple small infarcts bilaterally which raises suspicion of possible cardioembolic phenomenon. There appears to be a history of paroxysmal atrial fibrillation but warfarin therapy was thought not to be a good option given the history of falls from hip arthroplasty and an unsteady gait. Patient should continue with the current antiplatelet agent. 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 This increases risk of seizures. An EEG will be obtained.   2. Multiple old small cerebral hemorrhage likely due to amyloid angiopathy which increases the risk of significant intracranial hemorrhage and dementia. Dementia labs will be obtained.    This is an 80 year old white female who resides by herself. She was found to be acutely confused and disoriented. Workup in the hospital showed significant UTI with numerous bacteria and obesity along with positive nitrites. Initial head CT scan was negative but MRI scan shows evidence of an acute right caudate nuclei infarct. The patient apparently has significant help from her son and others. She apparently has had issues with gait impairment and falling and therefore has not been candidate for warfarin although she seems to have a history of paroxysmal atrial fibrillation. Patient is currently quite confused and is not able to provide history.  GENERAL: She is in no acute distress. She is feeding herself breakfast this morning.  HEENT: Supple. Atraumatic normocephalic.   ABDOMEN: soft  EXTREMITIES: No edema   BACK: Normal.  SKIN: Normal by  inspection.    MENTAL STATUS: She is awake and alert but totally disoriented. She does perseverate and is not able to have a sensible conversation. No significant dysarthria is observed. She follows commands with prompting.  CRANIAL NERVES: Pupils are equal, round and reactive to light and accommodation; extra ocular movements are full, there is no significant nystagmus; visual fields - Limited but appears to be full; upper and lower facial muscles are normal in strength and symmetric, there is no flattening of the nasolabial folds; tongue is midline; uvula is midline; shoulder elevation is normal.  MOTOR: Normal tone, bulk and strength; no pronator drift.  COORDINATION: Left finger to nose is normal, right finger to nose is normal, No rest tremor; no intention tremor; no postural tremor; no bradykinesia.  REFLEXES: Deep tendon reflexes are symmetrical and normal. Babinski reflexes are flexor bilaterally.   SENSATION: Normal to Pain bilaterally.    Past Medical History  Diagnosis Date  . GERD (gastroesophageal reflux disease)   . Hypertension   . Thyroid disease     subclinical hypothyroidism  . DVT (deep venous thrombosis)   . Overactive bladder   . CKD (chronic kidney disease)     Past Surgical History  Procedure Laterality Date  . Total hip arthroplasty      right  . Total knee arthroplasty      Left  . Abdominal hysterectomy    . Ivc filter      History reviewed. No pertinent family history.  Social History:  reports that she has quit smoking. She does not have any smokeless tobacco history on file. She reports that she does not  drink alcohol or use illicit drugs.  Allergies: No Known Allergies  Medications: Prior to Admission medications   Medication Sig Start Date End Date Taking? Authorizing Provider  aspirin EC 81 MG tablet Take 81 mg by mouth at bedtime.   Yes Historical Provider, MD  cloNIDine (CATAPRES) 0.2 MG tablet Take 1 tablet (0.2 mg total) by mouth 3  (three) times daily. 01/10/14  Yes Vernie Shanks, MD  furosemide (LASIX) 20 MG tablet Take 1 tablet (20 mg total) by mouth 2 (two) times daily. 01/10/14  Yes Vernie Shanks, MD  Multiple Vitamin (MULTIVITAMIN WITH MINERALS) TABS tablet Take 1 tablet by mouth at bedtime.   Yes Historical Provider, MD  omeprazole (PRILOSEC) 40 MG capsule Take 40 mg by mouth daily. 12/10/13  Yes Historical Provider, MD  tolterodine (DETROL LA) 4 MG 24 hr capsule Take 1 capsule (4 mg total) by mouth daily. 01/10/14  Yes Vernie Shanks, MD    Scheduled Meds: . amLODipine  5 mg Oral Daily  . aspirin EC  325 mg Oral QHS  . Chlorhexidine Gluconate Cloth  6 each Topical Q0600  . enoxaparin (LOVENOX) injection  30 mg Subcutaneous Q24H  . fesoterodine  4 mg Oral Daily  . fluconazole (DIFLUCAN) IV  100 mg Intravenous Q24H  . metoprolol tartrate  25 mg Oral BID  . multivitamin with minerals  1 tablet Oral QHS  . mupirocin ointment  1 application Nasal BID  . nystatin   Topical BID  . pantoprazole  40 mg Oral Daily   Continuous Infusions:  PRN Meds:.acetaminophen, acetaminophen, alum & mag hydroxide-simeth, hydrALAZINE, HYDROcodone-acetaminophen, ondansetron (ZOFRAN) IV, ondansetron   Blood pressure 226/93, pulse 93, temperature 98.1 F (36.7 C), temperature source Oral, resp. rate 16, height $RemoveBe'5\' 3"'bUuDnTPRS$  (1.6 m), weight 66.2 kg (145 lb 15.1 oz), SpO2 97.00%.   Results for orders placed during the hospital encounter of 02/04/14 (from the past 48 hour(s))  CBC     Status: None   Collection Time    02/06/14  5:56 AM      Result Value Ref Range   WBC 6.9  4.0 - 10.5 K/uL   RBC 4.89  3.87 - 5.11 MIL/uL   Hemoglobin 14.2  12.0 - 15.0 g/dL   HCT 42.6  36.0 - 46.0 %   MCV 87.1  78.0 - 100.0 fL   MCH 29.0  26.0 - 34.0 pg   MCHC 33.3  30.0 - 36.0 g/dL   RDW 15.2  11.5 - 15.5 %   Platelets 278  150 - 400 K/uL  BASIC METABOLIC PANEL     Status: Abnormal   Collection Time    02/06/14  5:56 AM      Result Value Ref Range    Sodium 140  137 - 147 mEq/L   Potassium 4.4  3.7 - 5.3 mEq/L   Comment: DELTA CHECK NOTED   Chloride 103  96 - 112 mEq/L   CO2 26  19 - 32 mEq/L   Glucose, Bld 106 (*) 70 - 99 mg/dL   BUN 16  6 - 23 mg/dL   Creatinine, Ser 1.37 (*) 0.50 - 1.10 mg/dL   Calcium 9.3  8.4 - 10.5 mg/dL   GFR calc non Af Amer 34 (*) >90 mL/min   GFR calc Af Amer 40 (*) >90 mL/min   Comment: (NOTE)     The eGFR has been calculated using the CKD EPI equation.     This calculation has not been validated  in all clinical situations.     eGFR's persistently <90 mL/min signify possible Chronic Kidney     Disease.  CBC     Status: None   Collection Time    02/07/14  4:56 AM      Result Value Ref Range   WBC 7.6  4.0 - 10.5 K/uL   RBC 5.11  3.87 - 5.11 MIL/uL   Hemoglobin 14.7  12.0 - 15.0 g/dL   HCT 44.6  36.0 - 46.0 %   MCV 87.3  78.0 - 100.0 fL   MCH 28.8  26.0 - 34.0 pg   MCHC 33.0  30.0 - 36.0 g/dL   RDW 15.2  11.5 - 15.5 %   Platelets 296  150 - 400 K/uL  BASIC METABOLIC PANEL     Status: Abnormal   Collection Time    02/07/14  4:56 AM      Result Value Ref Range   Sodium 142  137 - 147 mEq/L   Potassium 4.1  3.7 - 5.3 mEq/L   Chloride 104  96 - 112 mEq/L   CO2 26  19 - 32 mEq/L   Glucose, Bld 122 (*) 70 - 99 mg/dL   BUN 11  6 - 23 mg/dL   Creatinine, Ser 1.10  0.50 - 1.10 mg/dL   Calcium 9.5  8.4 - 10.5 mg/dL   GFR calc non Af Amer 45 (*) >90 mL/min   GFR calc Af Amer 52 (*) >90 mL/min   Comment: (NOTE)     The eGFR has been calculated using the CKD EPI equation.     This calculation has not been validated in all clinical situations.     eGFR's persistently <90 mL/min signify possible Chronic Kidney     Disease.    Ultrasound Carotid Bilateral  02/05/2014   CLINICAL DATA:  Cerebrovascular accident.  EXAM: BILATERAL CAROTID DUPLEX ULTRASOUND  TECHNIQUE: Pearline Cables scale imaging, color Doppler and duplex ultrasound were performed of bilateral carotid and vertebral arteries in the neck.  COMPARISON:   None.  FINDINGS: Criteria: Quantification of carotid stenosis is based on velocity parameters that correlate the residual internal carotid diameter with NASCET-based stenosis levels, using the diameter of the distal internal carotid lumen as the denominator for stenosis measurement.  The following velocity measurements were obtained:  RIGHT  ICA:  71/15 cm/sec  CCA:  84/6 cm/sec  SYSTOLIC ICA/CCA RATIO:  6.59  DIASTOLIC ICA/CCA RATIO:  9.35  ECA:  87 cm/sec  LEFT  ICA:  78/19 cm/sec  CCA:  70/1 cm/sec  SYSTOLIC ICA/CCA RATIO:  7.79  DIASTOLIC ICA/CCA RATIO:  3.90  ECA:  76 cm/sec  RIGHT CAROTID ARTERY: Minimal plaque formation is noted and proximal right internal carotid artery consistent with less than 50% diameter stenosis based on ultrasound and Doppler criteria.  RIGHT VERTEBRAL ARTERY:  Antegrade flow is noted.  LEFT CAROTID ARTERY: Minimal plaque formation is noted in the proximal left internal carotid artery consistent with less than 50% diameter stenosis based on ultrasound and Doppler criteria.  LEFT VERTEBRAL ARTERY:  Antegrade flow is noted.  IMPRESSION: No hemodynamically significant stenosis or plaque is noted in either cervical carotid artery.   Electronically Signed   By: Sabino Dick M.D.   On: 02/05/2014 11:24        Clemons Salvucci A. Merlene Laughter, M.D.  Diplomate, Tax adviser of Psychiatry and Neurology ( Neurology). 02/07/2014, 8:49 AM

## 2014-02-07 NOTE — Progress Notes (Signed)
Patient seen and examined. The above note reviewed.  She appears to be more confused today. Blood pressure was noted to be markedly elevated at 219/97. Her confusion is likely related to severe hypertension. Medications are being adjusted. She's been seen by neurology and EEG has been ordered. She is continued on antiplatelets for secondary stroke prophylaxis. She's been seen by physical therapy and they could not determine safety of discharge home yet. She'll be reevaluated tomorrow.  Darden RestaurantsJehanzeb Shanetha Walker

## 2014-02-07 NOTE — Progress Notes (Signed)
Physical Therapy Treatment Patient Details Name: Tracy Walker MRN: [id number] DOB: 10/12/1928 Today's Date: 02/07/2014    History of Present Illness Pt is very somnulent this afternoon and reported by family to be confused.    PT Comments    Pt is able to awaken and inconsistently follow directions, but keeps her eyes closed most of my visit.  She does display decreased coordination of the LUE/LLE, but strength is WNL.  She was unable to mobilize OOB for any gait today due to lethargy.  If this continues, she will not be able to return home and be alone.  We will reassess tomorrow and make further recommendations based on her status.  Follow Up Recommendations   to be determined     Equipment Recommendations    none   Recommendations for Other Services  OT     Precautions / Restrictions Precautions Precautions: Fall Restrictions Weight Bearing Restrictions: No    Mobility  Bed Mobility Overal bed mobility: Needs Assistance Bed Mobility: Supine to Sit     Supine to sit: Mod assist     General bed mobility comments: pt sleeps in a bed with capacity to elevate HOB...needed assist to sit due to increased drowsiness  Transfers:  Unable due to lethargy                    Ambulation/Gait:  Unable due to lethargy                              Modified Rankin (Stroke Patients Only)       Balance   Sitting-balance support: No upper extremity supported Sitting balance-Leahy Scale: Good                              Cognition Arousal/Alertness: Lethargic Behavior During Therapy: WFL for tasks assessed/performed Overall Cognitive Status: Within Functional Limits for tasks assessed                      Exercises General Exercises - Lower Extremity Ankle Circles/Pumps: AROM;Both;10 reps;Supine Heel Slides: AROM;Both;5 reps;Supine Hip ABduction/ADduction: AROM;Both;5 reps;Supine Straight Leg Raises: AROM;Both;5  reps;Supine              Home Living  lives alone, has 2 steps into the house, no railing available                    Prior Function  independent with Rollator but has had some falls          PT Goals (current goals can now be found in the care plan section) Progress towards PT goals: Not progressing toward goals - comment (confusion/lethargy)           PT Plan Current plan remains appropriate (if cognitiion/lethargy don't improve, may need SNF)                 End of Session   Activity Tolerance: Patient limited by fatigue Patient left: in bed;with call bell/phone within reach;with bed alarm set     Time: 1527-1601 PT Time Calculation (min): 34 min  Charges:  $Therapeutic Exercise: 8-22 mins $Therapeutic Activity: 8-22 mins                    G Codes:      Konrad PentaClaudia L Valetta Walker 02/07/2014, 4:08 PM

## 2014-02-08 DIAGNOSIS — R9401 Abnormal electroencephalogram [EEG]: Secondary | ICD-10-CM | POA: Diagnosis present

## 2014-02-08 DIAGNOSIS — N179 Acute kidney failure, unspecified: Secondary | ICD-10-CM

## 2014-02-08 DIAGNOSIS — N189 Chronic kidney disease, unspecified: Secondary | ICD-10-CM

## 2014-02-08 MED ORDER — METOPROLOL TARTRATE 50 MG PO TABS
50.0000 mg | ORAL_TABLET | Freq: Two times a day (BID) | ORAL | Status: DC
  Administered 2014-02-08 – 2014-02-10 (×5): 50 mg via ORAL
  Filled 2014-02-08 (×5): qty 1

## 2014-02-08 MED ORDER — AMLODIPINE BESYLATE 5 MG PO TABS
10.0000 mg | ORAL_TABLET | Freq: Every day | ORAL | Status: DC
  Administered 2014-02-08 – 2014-02-09 (×2): 10 mg via ORAL
  Filled 2014-02-08 (×3): qty 2

## 2014-02-08 MED ORDER — FLUCONAZOLE 100 MG PO TABS
100.0000 mg | ORAL_TABLET | Freq: Every day | ORAL | Status: DC
  Administered 2014-02-09 – 2014-02-10 (×2): 100 mg via ORAL
  Filled 2014-02-08 (×2): qty 1

## 2014-02-08 MED ORDER — LEVETIRACETAM 500 MG PO TABS
500.0000 mg | ORAL_TABLET | Freq: Two times a day (BID) | ORAL | Status: DC
  Administered 2014-02-08: 500 mg via ORAL
  Filled 2014-02-08 (×2): qty 1

## 2014-02-08 MED ORDER — FOLIC ACID 1 MG PO TABS
1.0000 mg | ORAL_TABLET | Freq: Every day | ORAL | Status: DC
  Administered 2014-02-08 – 2014-02-10 (×3): 1 mg via ORAL
  Filled 2014-02-08 (×3): qty 1

## 2014-02-08 NOTE — Progress Notes (Signed)
[facility] PROGRESS NOTE  Paschal DoppGarnette T Havlicek ZOX:[id number] DOB: 07/15/1928 DOA: 02/04/2014 PCP: Rudi HeapMOORE, DONALD, MD   Summary: 80 year old woman admitted with altered mental status found to be severely hypertensive with probable hypertensive encephalopathy as well as acute infarct in the right caudate per MRI, afib and UTI.    Assessment/Plan: Acute CVA. Likely related to malignant hypertension. Patient was found to have an acute infarct in the right caudate with evidence of multiple small infarcts bilaterally concerning for cardioembolic phenomenon per neuro. Aspirin has been increased to 325 mg daily.not coumadin candidate due to fall.  Echo with 65% EF and unable to eval LV function, Carotid doppler reveals no hemodynamically significant stenosis or plaque is noted in either cervical carotid artery. EEG results pending. LDL was found to be less than 100. Hemoglobin A1c 5.6. Evaluated by neurology.     Malignant hypertension. Patient was initially started on labetalol infusion. Shortly after, her blood pressure improved and she was weaned off. Poor control with encephalopathy. Norvasc and BB increased. Some improvement this am.  Will monitor BP q4hr x4 and provide prn hydralazine as indicated to better control BP.   Acute encephalopathy: likely related to #2. Improved initially and then worsened  02/07/14 with uncontrolled BP. Improved this am. Evaluated by neuro who opined that small right caudate nuclei infarct likely too small to cause the significant encephalopathy. Monitor closely   Transient atrial fibrillation. On admission, he was noted the patient had atrial fibrillation. She has since converted to sinus rhythm. TSH was normal. She does not appear to be a candidate for anticoagulation due to history of frequent falls. She also has a history of DVT in the past and has an IVC filter placed for.   UTI. Urine culture shows no growth, given 3 days of rocephin.   Mild troponin leak. Likely  related to severe hypertension. Echocardiogram shows no WMA. No further workup. She's not had any chest pai    Code Status: DNR Family Communication: none present Disposition Plan: home hopefully 24-48 hours   Consultants:  Neuro Dr Gerilyn Pilgrimoonquah  Procedures:  EEG Echo:- Left ventricle: The cavity size was normal. There was moderate concentric hypertrophy. Systolic function was vigorous. The estimated ejection fraction was in the range of 65% to 70%. Wall motion was normal; there were no regional wall motion abnormalities. The study was not technically sufficient to allow evaluation of LV diastolic dysfunction due to atrial fibrillation. - Aortic valve: Trileaflet; mildly thickened, mildly calcified leaflets. Transvalvular velocity was within the normal range. There was no stenosis. Mild regurgitation. - Mitral valve: Calcified annulus. Mildly thickened leaflets . No regurgitation. - Left atrium: The atrium was mildly dilated. - Right ventricle: Systolic function was normal. - Pulmonary arteries: Systolic pressure was within the normal range. - Pericardium     Antibiotics:  Rocephin  4/10-4/12  HPI/Subjective: Up in chair. Responds appropriately. Denies pain/discomfort  Objective: Filed Vitals:   02/08/14 0906  BP: 156/90  Pulse:   Temp:   Resp:     Intake/Output Summary (Last 24 hours) at 02/08/14 0934 Last data filed at 02/07/14 1851  Gross per 24 hour  Intake    240 ml  Output    500 ml  Net   -260 ml   Filed Weights   02/04/14 1700  Weight: 66.2 kg (145 lb 15.1 oz)    Exam:   General:  Well nourished appears comfortable  Cardiovascular: RRR No m/g/r no LE edema  Respiratory: normal effort BS clear  bilaterally no wheeze  Abdomen: non-distended, soft +BS non-tender to palpation  Musculoskeletal: joints without swelling/erythema   Data Reviewed: Basic Metabolic Panel:  Recent Labs Lab 02/04/14 1122 02/06/14 0556 02/07/14 0456  NA 142 140  142  K 3.4* 4.4 4.1  CL 101 103 104  CO2 27 26 26   GLUCOSE 121* 106* 122*  BUN 18 16 11   CREATININE 1.26* 1.37* 1.10  CALCIUM 9.6 9.3 9.5  MG 2.2  --   --    Liver Function Tests:  Recent Labs Lab 02/04/14 1122  AST 36  ALT 15  ALKPHOS 57  BILITOT 0.8  PROT 7.7  ALBUMIN 3.6   No results found for this basename: LIPASE, AMYLASE,  in the last 168 hours No results found for this basename: AMMONIA,  in the last 168 hours CBC:  Recent Labs Lab 02/04/14 1122 02/06/14 0556 02/07/14 0456  WBC 10.2 6.9 7.6  NEUTROABS 8.4*  --   --   HGB 14.8 14.2 14.7  HCT 43.3 42.6 44.6  MCV 85.1 87.1 87.3  PLT 256 278 296   Cardiac Enzymes:  Recent Labs Lab 02/04/14 1122 02/04/14 1957 02/05/14 0543  CKTOTAL 320*  --   --   TROPONINI <0.30 0.31* <0.30   BNP (last 3 results) No results found for this basename: PROBNP,  in the last 8760 hours CBG: No results found for this basename: GLUCAP,  in the last 168 hours  Recent Results (from the past 240 hour(s))  URINE CULTURE     Status: None   Collection Time    02/04/14 11:51 AM      Result Value Ref Range Status   Specimen Description URINE, CATHETERIZED   Final   Special Requests NONE   Final   Culture  Setup Time     Final   Value: 02/05/2014 01:21     Performed at [facility] Count     Final   Value: 5,000 COLONIES/ML     Performed at [facility]   Culture     Final   Value: INSIGNIFICANT GROWTH     Performed at [facility]   Report Status 02/06/2014 FINAL   Final  MRSA PCR SCREENING     Status: Abnormal   Collection Time    02/04/14  5:07 PM      Result Value Ref Range Status   MRSA by PCR POSITIVE (*) NEGATIVE Final   Comment:            The GeneXpert MRSA Assay (FDA     approved for NASAL specimens     only), is one component of a     comprehensive MRSA colonization     surveillance program. It is not     intended to diagnose MRSA     infection nor to guide or     monitor  treatment for     MRSA infections.     CRITICAL RESULT CALLED TO, READ BACK BY AND VERIFIED WITH:     L.KOGER AT 1928 ON 02/04/14 BY S.VANHOORNE     Studies: No results found.  Scheduled Meds: . amLODipine  10 mg Oral Daily  . aspirin EC  325 mg Oral QHS  . Chlorhexidine Gluconate Cloth  6 each Topical Q0600  . enoxaparin (LOVENOX) injection  40 mg Subcutaneous Q24H  . fesoterodine  4 mg Oral Daily  . fluconazole (DIFLUCAN) IV  100 mg Intravenous Q24H  . folic acid  1  mg Oral Daily  . metoprolol tartrate  50 mg Oral BID  . multivitamin with minerals  1 tablet Oral QHS  . mupirocin ointment  1 application Nasal BID  . nystatin   Topical BID  . pantoprazole  40 mg Oral Daily   Continuous Infusions:   Principal Problem:   Acute encephalopathy Active Problems:   GERD (gastroesophageal reflux disease)   HTN (hypertension)   Thyroid disease   DVT (deep venous thrombosis)   UTI (lower urinary tract infection)   Hypertensive urgency   Acute on chronic renal failure   A-fib   CVA (cerebral infarction)    Time spent: 30  mintues    Lesle ChrisKaren M [facility] CenterBlack  [facility] Pager [phone]. If 7PM-7AM, please contact night-coverage at [email or web address], password [facility] 02/08/2014, 9:34 AM  LOS: 4 days

## 2014-02-08 NOTE — Clinical Social Work Note (Signed)
[facility]. Facility notified as well as Lillia AbedLindsay with [facility] who plans to call [facility] this afternoon.  Derenda FennelKara Steffie Waggoner, [place] [phone]

## 2014-02-08 NOTE — Progress Notes (Signed)
Patient ID: Tracy Walker, female   DOB: 1928-07-05, 80 y.o.   MRN: [id number]  [place] A. Merlene Laughter, MD     [email or web address]          Tracy Walker is an 80 y.o. female.   Assessment/Plan: 1. Acute on the mental status likely due to toxic metabolic encephalopathies. The patient does have small right caudate nuclei infarct but this seems to too small to cause the significant encephalopathy. There is evidence of multiple small infarcts bilaterally which raises suspicion of possible cardioembolic phenomenon. There appears to be a history of paroxysmal atrial fibrillation but warfarin therapy was thought not to be a good option given the history of falls from hip arthroplasty and an unsteady gait. Patient should continue with the current antiplatelet agent.     2. Multiple old small cerebral hemorrhage likely due to amyloid angiopathy which increases the risk of significant intracranial hemorrhage and dementia. Dementia labs are significant for elevated homocysteine level. Folic acid will be started.  3. Abnormal EEG showing effects from metabolic encephalopathies but also showing increased risk of epileptic seizures with left temporal epileptiform discharges. The patient has recurrent spells of confusion, it may be worthwhile to treat her empirically with seizure medication such as Keppra.  The patient's is improved today. She is less confused and more with it.  GENERAL: She is in no acute distress. She is feeding herself breakfast this morning.  HEENT: Supple. Atraumatic normocephalic.  ABDOMEN: soft  EXTREMITIES: No edema  BACK: Normal.  SKIN: Normal by inspection.  MENTAL STATUS: She is awake and alert today. She is oriented to person and place but not time. She is more lucid and coherent. She follows commands well.  CRANIAL NERVES: Pupils are equal, round and reactive to light and accommodation; extra ocular movements are full, there is no significant nystagmus;  visual fields - Limited but appears to be full; upper and lower facial muscles are normal in strength and symmetric, there is no flattening of the nasolabial folds; tongue is midline; uvula is midline; shoulder elevation is normal.  MOTOR: Normal tone, bulk and strength; no pronator drift.  COORDINATION: Left finger to nose is normal, right finger to nose is normal, No rest tremor; no intention tremor; no postural tremor; no bradykinesia.         Objective: Vital signs in last 24 hours: Temp:  [97.8 F (36.6 C)-98.4 F (36.9 C)] 97.8 F (36.6 C) (04/14 0456) Pulse Rate:  [77-87] 83 (04/14 0456) Resp:  [18] 18 (04/14 0456) BP: (180-219)/(97-107) 196/97 mmHg (04/14 0456) SpO2:  [96 %-97 %] 96 % (04/14 0456)  Intake/Output from previous day: 04/13 0701 - 04/14 0700 In: 240 [P.O.:240] Out: 500 [Urine:500] Intake/Output this shift:   Nutritional status: Cardiac   Lab Results: Results for orders placed during the hospital encounter of 02/04/14 (from the past 48 hour(s))  CBC     Status: None   Collection Time    02/07/14  4:56 AM      Result Value Ref Range   WBC 7.6  4.0 - 10.5 K/uL   RBC 5.11  3.87 - 5.11 MIL/uL   Hemoglobin 14.7  12.0 - 15.0 g/dL   HCT 44.6  36.0 - 46.0 %   MCV 87.3  78.0 - 100.0 fL   MCH 28.8  26.0 - 34.0 pg   MCHC 33.0  30.0 - 36.0 g/dL   RDW 15.2  11.5 - 15.5 %   Platelets 296  150 -  400 K/uL  BASIC METABOLIC PANEL     Status: Abnormal   Collection Time    02/07/14  4:56 AM      Result Value Ref Range   Sodium 142  137 - 147 mEq/L   Potassium 4.1  3.7 - 5.3 mEq/L   Chloride 104  96 - 112 mEq/L   CO2 26  19 - 32 mEq/L   Glucose, Bld 122 (*) 70 - 99 mg/dL   BUN 11  6 - 23 mg/dL   Creatinine, Ser 1.10  0.50 - 1.10 mg/dL   Calcium 9.5  8.4 - 10.5 mg/dL   GFR calc non Af Amer 45 (*) >90 mL/min   GFR calc Af Amer 52 (*) >90 mL/min   Comment: (NOTE)     The eGFR has been calculated using the CKD EPI equation.     This calculation has not been  validated in all clinical situations.     eGFR's persistently <90 mL/min signify possible Chronic Kidney     Disease.  VITAMIN B12     Status: Abnormal   Collection Time    02/07/14  9:33 AM      Result Value Ref Range   Vitamin B-12 920 (*) 211 - 911 pg/mL   Comment: Performed at [facility]     Status: Abnormal   Collection Time    02/07/14  9:33 AM      Result Value Ref Range   Homocysteine 26.1 (*) 4.0 - 15.4 umol/L   Comment: Performed at [facility]  TSH     Status: Abnormal   Collection Time    02/07/14  9:33 AM      Result Value Ref Range   TSH 4.720 (*) 0.350 - 4.500 uIU/mL   Comment: Please note change in reference range.     Performed at [facility]    Lipid Panel No results found for this basename: CHOL, TRIG, HDL, CHOLHDL, VLDL, LDLCALC,  in the last 72 hours  Studies/Results: No results found.  Medications:  Scheduled Meds: . amLODipine  10 mg Oral Daily  . aspirin EC  325 mg Oral QHS  . Chlorhexidine Gluconate Cloth  6 each Topical Q0600  . enoxaparin (LOVENOX) injection  40 mg Subcutaneous Q24H  . fesoterodine  4 mg Oral Daily  . fluconazole (DIFLUCAN) IV  100 mg Intravenous Q24H  . metoprolol tartrate  50 mg Oral BID  . multivitamin with minerals  1 tablet Oral QHS  . mupirocin ointment  1 application Nasal BID  . nystatin   Topical BID  . pantoprazole  40 mg Oral Daily   Continuous Infusions:  PRN Meds:.acetaminophen, acetaminophen, alum & mag hydroxide-simeth, hydrALAZINE, HYDROcodone-acetaminophen, ondansetron (ZOFRAN) IV, ondansetron     LOS: 4 days   Jakim Drapeau A. Merlene Laughter, M.D.  Diplomate, Tax adviser of Psychiatry and Neurology ( Neurology).

## 2014-02-08 NOTE — Progress Notes (Signed)
Physical Therapy Treatment Patient Details Name: Tracy DoppGarnette T Doble MRN: [id number] DOB: 12/25/1927 Today's Date: 02/08/2014    History of Present Illness      PT Comments    Pt more awake today, pt able to verbalize name, DOB and where she is at.  Pt confused through session responding "Yes" to every question/task asked requiring multimodal cueing to complete task.  BP prior gait 156/90 supine.  Gait training complete with pt.'s rollator x 22 feet, min guard with no LOB episodes.  Pt left in chair with call bell within reach and chair alarm set.  End of treatment discussion with NP stateing pt will be remain in hospital until BP is stabilized, assess further status next session to asses for potention cognitive impairments that may impact discharge to home.    Follow Up Recommendations        Equipment Recommendations       Recommendations for Other Services       Precautions / Restrictions Precautions Precautions: Fall Restrictions Weight Bearing Restrictions: No    Mobility  Bed Mobility   Bed Mobility: Supine to Sit     Supine to sit: Min assist     General bed mobility comments: verbal and tactile cueing for handplacement to assist with bed mobility  Transfers Overall transfer level: Modified independent Equipment used: 4-wheeled walker Aeronautical engineer(Rollator)                Ambulation/Gait Ambulation/Gait assistance: Min guard Ambulation Distance (Feet): 22 Feet Assistive device: 4-wheeled walker (Rollator) Gait Pattern/deviations: Decreased stride length   Gait velocity interpretation: Below normal speed for age/gender     Stairs            Wheelchair Mobility    Modified Rankin (Stroke Patients Only)       Balance                                    Cognition Arousal/Alertness: Awake/alert Behavior During Therapy: WFL for tasks assessed/performed Overall Cognitive Status: Within Functional Limits for tasks assessed                       Exercises General Exercises - Lower Extremity Ankle Circles/Pumps: AROM;Both;10 reps;Supine Gluteal Sets: AROM;Both;10 reps Heel Slides: AROM;Both;10 reps Hip ABduction/ADduction: AROM;Both;Supine;10 reps Straight Leg Raises: AROM;Both;Supine;10 reps    General Comments        Pertinent Vitals/Pain No pain.    Home Living                      Prior Function            PT Goals (current goals can now be found in the care plan section) Progress towards PT goals: Progressing toward goals    Frequency       PT Plan Discharge plan needs to be updated    Co-evaluation             End of Session Equipment Utilized During Treatment: Gait belt Activity Tolerance: Patient tolerated treatment well;Patient limited by fatigue Patient left: in chair;with call bell/phone within reach;with chair alarm set;with nursing/sitter in room     Time: [phone] [id number] PT Time Calculation (min): 32 min  Charges:  $Gait Training: 8-22 mins $Therapeutic Exercise: 8-22 mins $Therapeutic Activity: 8-22 mins  G Codes:      Juel BurrowCasey Jo Cockerham 02/08/2014, 9:59 AM

## 2014-02-08 NOTE — Clinical Social Work Psychosocial (Signed)
Clinical Social Work Department BRIEF PSYCHOSOCIAL ASSESSMENT 02/08/2014  Patient:  Tracy Walker, Tracy Walker     Account Number:  [id number]     Admit date:  02/04/2014  Clinical Social Worker:  Tracy Walker  Date/Time:  02/08/2014 01:07 PM  Referred by:  CSW  Date Referred:  02/08/2014 Referred for  SNF Placement   Other Referral:   Interview type:  Patient Other interview type:   son- [place]    PSYCHOSOCIAL DATA Living Status:  ALONE Admitted from facility:   Level of care:   Primary support name:  Tracy Walker Primary support relationship to patient:  CHILD, ADULT Degree of support available:   supportive    CURRENT CONCERNS Current Concerns  Post-Acute Placement   Other Concerns:    SOCIAL WORK ASSESSMENT / PLAN CSW met with pt and pt's son, Tracy Walker at bedside. Pt alert and oriented. Reports she lives alone. Her son and daughter-in-law are her best support. Tracy Walker indicates his wife is primary caregiver and checks on pt daily after work. Pt generally manages fine at home alone. She states she cooks her own meals. She has a rollator for ambulating. Pt said she came to ED due to a fall. Pt had altered mental status at admit. She has had a CVA and also has a UTI. Pt was evaluated by PT over the weekend and recommendation was for home health at that time. However, pt is now requiring more assist and PT has changed recommendation to SNF. CSW discussed this with pt. She was initially very resistant and flat out said no. Pt states she has been to [facility] in the past. CSW validated pt's feelings, but asked about considering other facilities as pt's son definitely feels pt needs to go to rehab. She agreed to return to [facility] but will not consider any other facility at this point. Aware of insurance authorization and possible copays. CSW notified Tracy Walker with [facility] that recommendation is for SNF and pt is hopeful for [facility]. Referral faxed to [facility].   Assessment/plan  status:  Psychosocial Support/Ongoing Assessment of Needs Other assessment/ plan:   Information/referral to community resources:   SNF list    PATIENT'S/FAMILY'S RESPONSE TO PLAN OF CARE: Pt reluctantly agreeable to consider SNF again. CSW will follow up with pt after response from [facility].       Tracy Walker, [place]

## 2014-02-08 NOTE — Progress Notes (Signed)
Pt was seen this AM for PT.  Although more alert, her function has not significantly improved---certainly not enough for her to be able to manage at home alone.  For this reason, I am recommending that the discharge plan be revised and that the pt transition to SNF at the time of d/c.

## 2014-02-08 NOTE — Procedures (Signed)
  [facility] [place] A. Gerilyn Pilgrimoonquah, MD     [email or web address]           HISTORY: The patient is an 80 year old female who presents with confusion and altered mental status. This study is being done to evaluate for nonconvulsive seizures.  MEDICATIONS: Scheduled Meds: . amLODipine  10 mg Oral Daily  . aspirin EC  325 mg Oral QHS  . Chlorhexidine Gluconate Cloth  6 each Topical Q0600  . enoxaparin (LOVENOX) injection  40 mg Subcutaneous Q24H  . fesoterodine  4 mg Oral Daily  . fluconazole (DIFLUCAN) IV  100 mg Intravenous Q24H  . folic acid  1 mg Oral Daily  . metoprolol tartrate  50 mg Oral BID  . multivitamin with minerals  1 tablet Oral QHS  . mupirocin ointment  1 application Nasal BID  . nystatin   Topical BID  . pantoprazole  40 mg Oral Daily   Continuous Infusions:  PRN Meds:.acetaminophen, acetaminophen, alum & mag hydroxide-simeth, hydrALAZINE, HYDROcodone-acetaminophen, ondansetron (ZOFRAN) IV, ondansetron  Prior to Admission medications   Medication Sig Start Date End Date Taking? Authorizing Provider  aspirin EC 81 MG tablet Take 81 mg by mouth at bedtime.   Yes Historical Provider, MD  cloNIDine (CATAPRES) 0.2 MG tablet Take 1 tablet (0.2 mg total) by mouth 3 (three) times daily. 01/10/14  Yes Ileana LaddFrancis P Wong, MD  furosemide (LASIX) 20 MG tablet Take 1 tablet (20 mg total) by mouth 2 (two) times daily. 01/10/14  Yes Ileana LaddFrancis P Wong, MD  Multiple Vitamin (MULTIVITAMIN WITH MINERALS) TABS tablet Take 1 tablet by mouth at bedtime.   Yes Historical Provider, MD  omeprazole (PRILOSEC) 40 MG capsule Take 40 mg by mouth daily. 12/10/13  Yes Historical Provider, MD  tolterodine (DETROL LA) 4 MG 24 hr capsule Take 1 capsule (4 mg total) by mouth daily. 01/10/14  Yes Ileana LaddFrancis P Wong, MD      ANALYSIS: A 16 channel recording using standard 10 20 measurements is conducted for 22 minutes. The background activity consists highest 6 Hz. Awake and drowsy activities are observed.  Photic stimulation and hyperventilation were not carried out. The patient is noted to have phase reversal at T3 on several occasions. There also appears to be generalized triphasic waves seen throughout the recording.   IMPRESSION: This recording is abnormal due to the following: 1. Left temporal epileptiform discharges. 2. Moderate generalized slowing indicating a moderate generalized encephalopathy. 3. Triphasic waves which are typically seen in metabolic encephalopathies due to renal insufficiency/renal failure and hepatic insufficiency/hepatic failure.      Johnavon Mcclafferty A. Gerilyn Pilgrimoonquah, M.D.  Diplomate, Biomedical engineerAmerican Board of Psychiatry and Neurology ( Neurology).

## 2014-02-08 NOTE — Clinical Social Work Placement (Signed)
Clinical Social Work Department CLINICAL SOCIAL WORK PLACEMENT NOTE 02/08/2014  Patient:  Tracy Walker,Tracy Walker  Account Number:  [id number] Admit date:  02/04/2014  Clinical Social Worker:  Derenda FennelKARA Taylie Helder, LCSW  Date/time:  02/08/2014 01:04 PM  Clinical Social Work is seeking post-discharge placement for this patient at the following level of care:   SKILLED NURSING   (*CSW will update this form in Epic as items are completed)   02/08/2014  Patient/family provided with [facility] of Clinical Social Work's list of facilities offering this level of care within the geographic area requested by the patient (or if unable, by the patient's family).  02/08/2014  Patient/family informed of their freedom to choose among providers that offer the needed level of care, that participate in Medicare, Medicaid or managed care program needed by the patient, have an available bed and are willing to accept the patient.  02/08/2014  Patient/family informed of [facility]' ownership interest in [facility], as well as of the fact that they are under no obligation to receive care at this facility.  PASARR submitted to EDS on  PASARR number received from EDS on   FL2 transmitted to all facilities in geographic area requested by pt/family on  02/08/2014 FL2 transmitted to all facilities within larger geographic area on   Patient informed that his/her managed care company has contracts with or will negotiate with  certain facilities, including the following:     Patient/family informed of bed offers received:   Patient chooses bed at  Physician recommends and patient chooses bed at    Patient to be transferred to  on   Patient to be transferred to facility by   The following physician request were entered in Epic:   Additional Comments: Pt has existing pasarr.  Derenda FennelKara Abayomi Pattison, KentuckyLCSW [phone]

## 2014-02-08 NOTE — Progress Notes (Signed)
Patient seen and examined.  Above note reviewed.  She was admitted to the hospital with altered mental status and encephalopathy. She was found to have malignant hypertension and resultant hypertensive encephalopathy. She also suffered an acute infarct in the right caudate. She was seen by neurology and antiplatelet therapy has been upgraded to aspirin 325 mg daily. EEG was found to be abnormal with left temporal epileptiform discharges. Recommendations were to start the patient on Keppra. She initially had atrial fibrillation on admission which has since converted to sinus rhythm. She is not candidate for anticoagulation due to high fall risk. She has had recurrent high blood pressures and her antihypertensive medications are being adjusted. She was seen by physical therapy and recommendation for skilled nursing facility placement.   Darden RestaurantsJehanzeb Memon

## 2014-02-09 LAB — BASIC METABOLIC PANEL
BUN: 10 mg/dL (ref 6–23)
CO2: 26 mEq/L (ref 19–32)
CREATININE: 1.2 mg/dL — AB (ref 0.50–1.10)
Calcium: 9.6 mg/dL (ref 8.4–10.5)
Chloride: 106 mEq/L (ref 96–112)
GFR calc Af Amer: 47 mL/min — ABNORMAL LOW (ref >90)
GFR, EST NON AFRICAN AMERICAN: 40 mL/min — AB (ref >90)
Glucose, Bld: 107 mg/dL — ABNORMAL HIGH (ref 70–99)
Potassium: 4.4 mEq/L (ref 3.7–5.3)
Sodium: 142 mEq/L (ref 137–147)

## 2014-02-09 MED ORDER — LEVETIRACETAM 500 MG PO TABS
250.0000 mg | ORAL_TABLET | Freq: Two times a day (BID) | ORAL | Status: DC
  Administered 2014-02-09 – 2014-02-10 (×3): 250 mg via ORAL
  Filled 2014-02-09 (×2): qty 1

## 2014-02-09 MED ORDER — HYDROCHLOROTHIAZIDE 25 MG PO TABS
25.0000 mg | ORAL_TABLET | Freq: Every day | ORAL | Status: DC
  Administered 2014-02-10: 25 mg via ORAL
  Filled 2014-02-09: qty 1

## 2014-02-09 MED ORDER — STROKE: EARLY STAGES OF RECOVERY BOOK
Freq: Once | Status: AC
  Administered 2014-02-09: 11:00:00
  Filled 2014-02-09: qty 1

## 2014-02-09 MED ORDER — FUROSEMIDE 20 MG PO TABS
20.0000 mg | ORAL_TABLET | Freq: Every day | ORAL | Status: DC
  Administered 2014-02-09: 20 mg via ORAL
  Filled 2014-02-09: qty 1

## 2014-02-09 MED ORDER — HYDROCHLOROTHIAZIDE 12.5 MG PO CAPS: 12.5000 mg | ORAL_CAPSULE | Freq: Every day | ORAL | Status: DC

## 2014-02-09 NOTE — Progress Notes (Signed)
Patient ID: Tracy Walker, female   DOB: Dec 11, 1927, 80 y.o.   MRN: [id number]  [place] A. Merlene Laughter, MD     [email or web address]          Tracy Walker is an 80 y.o. female.   Assessment/Plan: 1. Acute on the mental status likely due to toxic metabolic encephalopathies. The patient does have small right caudate nuclei infarct but this seems to too small to cause the significant encephalopathy. There is evidence of multiple small infarcts bilaterally which raises suspicion of possible cardioembolic phenomenon. There appears to be a history of paroxysmal atrial fibrillation but warfarin therapy was thought not to be a good option given the history of falls from hip arthroplasty and an unsteady gait. Patient should continue with the current antiplatelet agent.     2. Multiple old small cerebral hemorrhage likely due to amyloid angiopathy which increases the risk of significant intracranial hemorrhage and dementia. Dementia labs are significant for elevated homocysteine level. Folic acid will be started.  3. Abnormal EEG showing effects from metabolic encephalopathies but also showing increased risk of epileptic seizures with left temporal epileptiform discharges. The patient has recurrent spells of confusion, it may be worthwhile to treat her empirically with seizure medication such as Keppra. Will start 264m bid.  The patient's is improved today. She is less confused and more with it.  GENERAL: She is in no acute distress. She is feeding herself breakfast this morning.  HEENT: Supple. Atraumatic normocephalic.  ABDOMEN: soft  EXTREMITIES: No edema  BACK: Normal.  SKIN: Normal by inspection.  MENTAL STATUS: She is awake and alert today. She is oriented to person and place but not time. She is more lucid and coherent. She follows commands well. Initially she was somewhat disoriented but was easily reoriented. CRANIAL NERVES: Pupils are equal, round and reactive to light  and accommodation; extra ocular movements are full, there is no significant nystagmus; visual fields - Limited but appears to be full; upper and lower facial muscles are normal in strength and symmetric, there is no flattening of the nasolabial folds; tongue is midline; uvula is midline; shoulder elevation is normal.  MOTOR: Normal tone, bulk and strength; no pronator drift.  COORDINATION: Left finger to nose is normal, right finger to nose is normal, No rest tremor; no intention tremor; no postural tremor; no bradykinesia.         Objective: Vital signs in last 24 hours: Temp:  [97.7 F (36.5 C)-98.9 F (37.2 C)] 98.4 F (36.9 C) (04/15 0601) Pulse Rate:  [78-87] 78 (04/15 0601) Resp:  [16-19] 19 (04/15 0601) BP: (145-186)/(72-94) 186/91 mmHg (04/15 0601) SpO2:  [94 %-97 %] 97 % (04/15 0601)  Intake/Output from previous day: 04/14 0701 - 04/15 0700 In: 410 [P.O.:360; IV Piggyback:50] Out: -  Intake/Output this shift:   Nutritional status: Cardiac   Lab Results: Results for orders placed during the hospital encounter of 02/04/14 (from the past 48 hour(s))  VITAMIN B12     Status: Abnormal   Collection Time    02/07/14  9:33 AM      Result Value Ref Range   Vitamin B-12 920 (*) 211 - 911 pg/mL   Comment: [place]formed at [place]    Status: Abnormal   Collection Time    02/07/14  9:33 AM      Result Value Ref Range   Homocysteine 26.1 (*) 4.0 - 15.4 umol/L   Comment: Performed at [facility]     Status: Abnormal   Collection Time    02/07/14  9:33 AM      Result Value Ref Range   TSH 4.720 (*) 0.350 - 4.500 uIU/mL   Comment: Please note change in reference range.     Performed at [facility]     Status: Abnormal   Collection Time    02/09/14  5:05 AM      Result Value Ref Range   Sodium 142  137 - 147 mEq/L   Potassium 4.4  3.7 - 5.3 mEq/L   Chloride 106  96 - 112 mEq/L   CO2 26  19 - 32 mEq/L    Glucose, Bld 107 (*) 70 - 99 mg/dL   BUN 10  6 - 23 mg/dL   Creatinine, Ser 1.20 (*) 0.50 - 1.10 mg/dL   Calcium 9.6  8.4 - 10.5 mg/dL   GFR calc non Af Amer 40 (*) >90 mL/min   GFR calc Af Amer 47 (*) >90 mL/min   Comment: (NOTE)     The eGFR has been calculated using the CKD EPI equation.     This calculation has not been validated in all clinical situations.     eGFR's persistently <90 mL/min signify possible Chronic Kidney     Disease.    Lipid Panel No results found for this basename: CHOL, TRIG, HDL, CHOLHDL, VLDL, LDLCALC,  in the last 72 hours  Studies/Results: No results found.  Medications:  Scheduled Meds: . amLODipine  10 mg Oral Daily  . aspirin EC  325 mg Oral QHS  . enoxaparin (LOVENOX) injection  40 mg Subcutaneous Q24H  . fesoterodine  4 mg Oral Daily  . fluconazole  100 mg Oral Daily  . folic acid  1 mg Oral Daily  . levETIRAcetam  500 mg Oral BID  . metoprolol tartrate  50 mg Oral BID  . multivitamin with minerals  1 tablet Oral QHS  . mupirocin ointment  1 application Nasal BID  . nystatin   Topical BID  . pantoprazole  40 mg Oral Daily   Continuous Infusions:  PRN Meds:.acetaminophen, acetaminophen, alum & mag hydroxide-simeth, hydrALAZINE, HYDROcodone-acetaminophen, ondansetron (ZOFRAN) IV, ondansetron     LOS: 5 days   [place] A. Merlene Laughter, M.D.  Diplomate, Tax adviser of Psychiatry and Neurology ( Neurology).

## 2014-02-09 NOTE — Progress Notes (Signed)
Patient seen, independently examined and chart reviewed. I agree with exam, assessment and plan discussed with Toya SmothersKaren Black, NP.  Subjective: Overall feeling good today with no complaints except feels a little bit cold.  Objective: Afebrile, no hypoxia. Vitals overall stable, blood pressure somewhat labile. Gen. Appears calm and comfortable, speech fluent and clear. Cardiovascular regular rate and rhythm. No murmur, rub or gallop. No lower extremity edema. Respiratory clear to auscultation bilaterally. No wheezes, rales or rhonchi. Normal respiratory effort. Able to lift both legs off the bed. Bilateral upper extremities is grossly normal strength. Face symmetric  Basic metabolic panel unremarkable.  Complex patient with malignant hypertension, acute stroke as well as temporal epileptiform discharges and encephalopathy intermittently. Neurology has recommended aspirin, not a Coumadin candidate secondary to falls. Started on Keppra. Malignant hypertension has been somewhat labile, likely a component of this is rebound hypertension off clonidine which was discontinued on admission. Diuretic has been added. Continue other medications. Continue to monitor blood pressure. Anticipate transfer to skilled nursing facility tomorrow.  Brendia Sacksaniel Adrinne Sze, MD [facility] [phone]

## 2014-02-09 NOTE — Progress Notes (Signed)
[facility] PROGRESS NOTE  Tracy Walker ZOX:[id number] DOB: Jan 01, 1928 DOA: 02/04/2014 PCP: Rudi Heap, MD  Summary:  80 year old woman admitted with altered mental status found to be severely hypertensive with probable hypertensive encephalopathy as well as acute infarct in the right caudate per MRI, afib and UTI. EEG reveals left temporal epileptiform discharges. Neuro following.    Assessment/Plan: Acute CVA. Likely related to malignant hypertension. Patient was found to have an acute infarct in the right caudate with evidence of multiple small infarcts bilaterally concerning for cardioembolic phenomenon per neuro. Aspirin has been increased to 325 mg daily.not coumadin candidate due to fall. Echo with 65% EF and unable to eval LV function, Carotid doppler reveals no hemodynamically significant stenosis or plaque is noted in either cervical carotid artery. EEG results with left temporal epileptiform discharges. LDL was found to be less than 100. Hemoglobin A1c 5.6. Evaluated by neurology who started keppra and recommended continuing asa at 325mg .  Malignant hypertension. Patient on clonidine prior to admission. Patient was initially started on labetalol infusion. Shortly after, her blood pressure improved and she was weaned off. Poor control with encephalopathy since. Norvasc and BB increased with only slight improvement. Will add lasix daily.  monitor BP q4hr x4 and provide prn hydralazine as indicated to better control BP.    Acute encephalopathy: likely related to #2. Improved initially and then worsened 02/07/14 with uncontrolled BP. Evaluated by neuro who opined that small right caudate nuclei infarct likely too small to cause the significant encephalopathy but patient with old small cerebral hemorrhage likely due to amyloid angiopathy which (per neuro) increases the risk of significant intracranial hemorrhage and dementia. Elevated homocysteine. Folic acid started. Improve this am.    Abnormal EEG. Left temporal epileptiform discharges. She has recurrent spells of confusion so keppra recommended empirically.     Transient atrial fibrillation. On admission, was noted the patient had atrial fibrillation. She has since converted to sinus rhythm. TSH 4.7. She does not appear to be a candidate for anticoagulation due to history of frequent falls. She also has a history of DVT in the past and has an IVC filter.   UTI. Urine culture shows no growth, given 3 days of rocephin.    Mild troponin leak. Likely related to severe hypertension. Echocardiogram shows no WMA. No further workup. She's not had any chest pai    Code Status: DNR Family Communication: none present Disposition Plan: snf hopefully tomorrow   Consultants:  Dr. Gerilyn Pilgrim Neuro  Procedures: Echo:- Left ventricle: The cavity size was normal. There was moderate concentric hypertrophy. Systolic function was vigorous. The estimated ejection fraction was in the range of 65% to 70%. Wall motion was normal; there were no regional wall motion abnormalities. The study was not technically sufficient to allow evaluation of LV diastolic dysfunction due to atrial fibrillation. - Aortic valve: Trileaflet; mildly thickened, mildly calcified leaflets. Transvalvular velocity was within the normal range. There was no stenosis. Mild regurgitation. - Mitral valve: Calcified annulus. Mildly thickened leaflets . No regurgitation. - Left atrium: The atrium was mildly dilated. - Right ventricle: Systolic function was normal. - Pulmonary arteries: Systolic pressure was within the normal range.  EEG  1. Left temporal epileptiform discharges. 2. Moderate generalized slowing indicating a moderate generalized  encephalopathy. 3. Triphasic waves which are typically seen in metabolic  encephalopathies due to renal insufficiency/renal failure and  hepatic insufficiency/hepatic failure.    Antibiotics: Rocephin 4/10  -4/12  HPI/Subjective: Lying in bed. Denies pain/discomfort. Alert  Objective: Filed Vitals:   02/09/14 1042  BP: 155/60  Pulse: 65  Temp: 98 F (36.7 C)  Resp: 20    Intake/Output Summary (Last 24 hours) at 02/09/14 1058 Last data filed at 02/08/14 1813  Gross per 24 hour  Intake    290 ml  Output      0 ml  Net    290 ml   Filed Weights   02/04/14 1700  Weight: 66.2 kg (145 lb 15.1 oz)    Exam:   General:  Appears comfortable, NAD  Cardiovascular: RRR no m/g/r No LE edema  Respiratory: normal effort BS clear bilaterally no wheeze  Abdomen: non-distended, +BS non-tender to palpation  Musculoskeletal: no clubbing or cyanosis  Data Reviewed: Basic Metabolic Panel:  Recent Labs Lab 02/04/14 1122 02/06/14 0556 02/07/14 0456 02/09/14 0505  NA 142 140 142 142  K 3.4* 4.4 4.1 4.4  CL 101 103 104 106  CO2 27 26 26 26   GLUCOSE 121* 106* 122* 107*  BUN 18 16 11 10   CREATININE 1.26* 1.37* 1.10 1.20*  CALCIUM 9.6 9.3 9.5 9.6  MG 2.2  --   --   --    Liver Function Tests:  Recent Labs Lab 02/04/14 1122  AST 36  ALT 15  ALKPHOS 57  BILITOT 0.8  PROT 7.7  ALBUMIN 3.6   No results found for this basename: LIPASE, AMYLASE,  in the last 168 hours No results found for this basename: AMMONIA,  in the last 168 hours CBC:  Recent Labs Lab 02/04/14 1122 02/06/14 0556 02/07/14 0456  WBC 10.2 6.9 7.6  NEUTROABS 8.4*  --   --   HGB 14.8 14.2 14.7  HCT 43.3 42.6 44.6  MCV 85.1 87.1 87.3  PLT 256 278 296   Cardiac Enzymes:  Recent Labs Lab 02/04/14 1122 02/04/14 1957 02/05/14 0543  CKTOTAL 320*  --   --   TROPONINI <0.30 0.31* <0.30   BNP (last 3 results) No results found for this basename: PROBNP,  in the last 8760 hours CBG: No results found for this basename: GLUCAP,  in the last 168 hours  Recent Results (from the past 240 hour(s))  URINE CULTURE     Status: None   Collection Time    02/04/14 11:51 AM      Result Value Ref Range  Status   Specimen Description URINE, CATHETERIZED   Final   Special Requests NONE   Final   Culture  Setup Time     Final   Value: 02/05/2014 01:21     Performed at [facility]   Colony Count     Final   Value: 5,000 COLONIES/ML     Performed at [facility]   Culture     Final   Value: INSIGNIFICANT GROWTH     Performed at [facility]   Report Status 02/06/2014 FINAL   Final  MRSA PCR SCREENING     Status: Abnormal   Collection Time    02/04/14  5:07 PM      Result Value Ref Range Status   MRSA by PCR POSITIVE (*) NEGATIVE Final   Comment:            The GeneXpert MRSA Assay (FDA     approved for NASAL specimens     only), is one component of a     comprehensive MRSA colonization     surveillance program. It is not     intended to diagnose  MRSA     infection nor to guide or     monitor treatment for     MRSA infections.     CRITICAL RESULT CALLED TO, READ BACK BY AND VERIFIED WITH:     L.KOGER AT 1928 ON 02/04/14 BY S.VANHOORNE     Studies: No results found.  Scheduled Meds: . amLODipine  10 mg Oral Daily  . aspirin EC  325 mg Oral QHS  . enoxaparin (LOVENOX) injection  40 mg Subcutaneous Q24H  . fesoterodine  4 mg Oral Daily  . fluconazole  100 mg Oral Daily  . folic acid  1 mg Oral Daily  . furosemide  20 mg Oral Daily  . levETIRAcetam  250 mg Oral BID  . metoprolol tartrate  50 mg Oral BID  . multivitamin with minerals  1 tablet Oral QHS  . nystatin   Topical BID  . pantoprazole  40 mg Oral Daily   Continuous Infusions:   Principal Problem:   Acute encephalopathy Active Problems:   GERD (gastroesophageal reflux disease)   HTN (hypertension)   Thyroid disease   UTI (lower urinary tract infection)   Hypertensive urgency   Acute on chronic renal failure   A-fib   CVA (cerebral infarction)   Abnormal EEG    Time spent: 30 minutes    Lesle ChrisKaren M [facility]Black  [facility] Pager [phone]. If 7PM-7AM, please contact  night-coverage at [email or web address], password [facility] 02/09/2014, 10:58 AM  LOS: 5 days

## 2014-02-09 NOTE — Progress Notes (Signed)
Physical Therapy Treatment Patient Details Name: Tracy Walker MRN: [id number] DOB: 03/25/1928 Today's Date: 02/09/2014       PT Comments    Pt reluctant to work with PT initially due to patient being cold.  Pt with slow transitions/movements.  Pt required verbal cues to transfer safely and cues to use UE's appropriately.  [facility] to void and required min assist with hygiene for safely and balance.   Completed 200 feet ambulation with all wheeled walker and returned to recliner to eat lunch.  Family members were present.  Pt will need continued therapy to increase strength, safety and return to prior level of functioning.               Precautions / Restrictions Precautions Precautions: Fall    Mobility  Bed Mobility Overal bed mobility: Needs Assistance Bed Mobility: Supine to Sit     Supine to sit: Min assist     General bed mobility comments: verbal and tactile cueing for handplacement to assist with bed mobility; tends to use bedrails  Transfers Overall transfer level: Modified independent Equipment used: 4-wheeled walker                Ambulation/Gait Ambulation/Gait assistance: Min guard Ambulation Distance (Feet): 200 Feet Assistive device: 4-wheeled walker                      PT Goals (current goals can now be found in the care plan section)  progressing towards goals    PT Plan  Continue per POC    End of Session Equipment Utilized During Treatment: Gait belt Activity Tolerance: Patient tolerated treatment well;Patient limited by fatigue Patient left: in chair;with call bell/phone within reach;with chair alarm set;with family/visitor present     Time: 1135-1200 Charges:  There act 10', gait 25'                   Amy B Frazier, PTA/CLT 02/09/2014, 1:19 PM

## 2014-02-09 NOTE — Clinical Social Work Placement (Signed)
Clinical Social Work Department CLINICAL SOCIAL WORK PLACEMENT NOTE 02/09/2014  Patient:  Tracy Walker,Tracy Walker  Account Number:  [id number] Admit date:  02/04/2014  Clinical Social Worker:  Derenda FennelKARA Abbygayle Helfand, LCSW  Date/time:  02/08/2014 01:04 PM  Clinical Social Work is seeking post-discharge placement for this patient at the following level of care:   SKILLED NURSING   (*CSW will update this form in Epic as items are completed)   02/08/2014  Patient/family provided with [facility] of Clinical Social Work's list of facilities offering this level of care within the geographic area requested by the patient (or if unable, by the patient's family).  02/08/2014  Patient/family informed of their freedom to choose among providers that offer the needed level of care, that participate in Medicare, Medicaid or managed care program needed by the patient, have an available bed and are willing to accept the patient.  02/08/2014  Patient/family informed of [facility]' ownership interest in [facility], as well as of the fact that they are under no obligation to receive care at this facility.  PASARR submitted to EDS on  PASARR number received from EDS on   FL2 transmitted to all facilities in geographic area requested by pt/family on  02/08/2014 FL2 transmitted to all facilities within larger geographic area on   Patient informed that his/her managed care company has contracts with or will negotiate with  certain facilities, including the following:     Patient/family informed of bed offers received:  02/09/2014 [facility] Physician [facility] and patient [facility]  Patient to be transferred to  on   Patient to be transferred to facility by   The following physician request were entered in Epic:   Additional Comments: Pt has existing pasarr.  Derenda FennelKara Elmina Hendel, [place] [phone]

## 2014-02-10 DIAGNOSIS — B373 Candidiasis of vulva and vagina: Secondary | ICD-10-CM | POA: Diagnosis not present

## 2014-02-10 DIAGNOSIS — B3731 Acute candidiasis of vulva and vagina: Secondary | ICD-10-CM | POA: Diagnosis not present

## 2014-02-10 LAB — BASIC METABOLIC PANEL
BUN: 13 mg/dL (ref 6–23)
CO2: 26 mEq/L (ref 19–32)
CREATININE: 1.28 mg/dL — AB (ref 0.50–1.10)
Calcium: 9.9 mg/dL (ref 8.4–10.5)
Chloride: 104 mEq/L (ref 96–112)
GFR, EST AFRICAN AMERICAN: 43 mL/min — AB (ref >90)
GFR, EST NON AFRICAN AMERICAN: 37 mL/min — AB (ref >90)
GLUCOSE: 98 mg/dL (ref 70–99)
Potassium: 4 mEq/L (ref 3.7–5.3)
Sodium: 143 mEq/L (ref 137–147)

## 2014-02-10 MED ORDER — AMLODIPINE BESYLATE 10 MG PO TABS: 10.0000 mg | ORAL_TABLET | Freq: Every day | ORAL | Status: DC

## 2014-02-10 MED ORDER — HYDROCHLOROTHIAZIDE 25 MG PO TABS: 25.0000 mg | ORAL_TABLET | Freq: Every day | ORAL | Status: DC

## 2014-02-10 MED ORDER — NYSTATIN 100000 UNIT/GM EX POWD: CUTANEOUS | Status: DC

## 2014-02-10 MED ORDER — LEVETIRACETAM 250 MG PO TABS: 250.0000 mg | ORAL_TABLET | Freq: Two times a day (BID) | ORAL | Status: DC

## 2014-02-10 MED ORDER — FOLIC ACID 1 MG PO TABS: 1.0000 mg | ORAL_TABLET | Freq: Every day | ORAL | Status: DC

## 2014-02-10 MED ORDER — HYDROCODONE-ACETAMINOPHEN 5-325 MG PO TABS: 1.0000 | ORAL_TABLET | Freq: Four times a day (QID) | ORAL | Status: DC | PRN

## 2014-02-10 MED ORDER — METOPROLOL TARTRATE 50 MG PO TABS: 50.0000 mg | ORAL_TABLET | Freq: Two times a day (BID) | ORAL | Status: DC

## 2014-02-10 MED ORDER — FLUCONAZOLE 100 MG PO TABS: 100.0000 mg | ORAL_TABLET | Freq: Every day | ORAL | Status: DC

## 2014-02-10 MED ORDER — ASPIRIN 325 MG PO TBEC: 325.0000 mg | DELAYED_RELEASE_TABLET | Freq: Every day | ORAL | Status: DC

## 2014-02-10 MED ORDER — ENOXAPARIN SODIUM 30 MG/0.3ML ~~LOC~~ SOLN: 30.0000 mg | SUBCUTANEOUS | Status: DC

## 2014-02-10 MED ORDER — FLUCONAZOLE 100 MG PO TABS: ORAL_TABLET | ORAL | Status: DC

## 2014-02-10 NOTE — Progress Notes (Signed)
[facility]

## 2014-02-10 NOTE — Progress Notes (Signed)
Patient has been here since 02/04/14 on telemetry, and possibly being discharged to a nursing home tomorrow. Paged on call physician to get permission to d/c telemetry. MD called back and we are d/c telemetry.

## 2014-02-10 NOTE — Progress Notes (Signed)
Patient seen, independently examined and chart reviewed. I agree with exam, assessment and plan discussed with Toya SmothersKaren Black, NP.  Subjective: No complaints. She is eager to begin rehabilitation. No new issues.  Objective: Afebrile, blood pressure somewhat labile but overall much improved. Has not required any hydralazine PRN >24 hours. Appears calm and comfortable. Speech fluent and clear. Cardiovascular regular rate and rhythm. No murmur, rub or gallop. No lower extremity edema. Respiratory clear to auscultation bilaterally. No wheezes, rales or rhonchi. Normal respiratory effort. Psychiatric. Grossly normal mood and affect. Speech fluent and appropriate. Cranial nerves appear grossly intact. Face symmetric. Moves all extremities to command with excellent strength.  Chronic kidney disease appears to be at baseline, creatinine stable and BMP. TSH, 2 measurements, with some variation. Followup as an outpatient. UC insignificant growth, was treated with 3 days of ceftriaxone.   Overall appears much improved with improving blood pressure and no gross focal neurologic deficits. In regard to acute right caudate nuclei infarct--neurology recommended aspirin. Evidence of multiple small infarcts raise the possibility of cardioembolic phenomenon. However not felt to be a candidate for anticoagulation and therefore neurology recommended antiplatelet therapy with no further evaluation.  Urinalysis was equivocal but urine culture unremarkable. Nevertheless did receive 3 days of Rocephin.  Elevated troponin likely from hypertensive encephalopathy. Subsequent troponin normal. Echocardiogram unremarkable.  Abnormal EEG. No evidence of seizures. Keppra per neurology.  In regard to atrial fibrillation may have a history of paroxysmal atrial fibrillation. Subsequently noted to be in sinus rhythm. Not candidate for anticoagulation secondary to history of falls as well as neurology recommendations. Statin not  recommended by neurology. Has low LDL already. Addition of statin as an outpatient could be considered in follow up.  TSH equivocal. Followup as an outpatient.  Appears stable for transfer to rehabilitation today. Discussed with Dr. Gerilyn Pilgrimoonquah, recommends no statin at this time based on age and LDL. Recommends ASA 325  Tracy Sacksaniel Nuriyah Hanline, MD [facility] [phone]  Summary  80 year old woman with history of chronic kidney disease, DVT no longer on warfarin, recurrent falls, not candidate for anticoagulation secondary to fall next presented emergency department with a history of fall. She was admitted for acute encephalopathy, malignant/accelerated hypertension/hypertensive urgency versus emergency thought to be related to missed dose of clonidine. Other issues on admission included atrial fibrillation which appear to be a new diagnosis, UTI, acute on chronic renal failure.   MRI revealed acute infarct right caudate. She was not a candidate for TPA secondary to unknown time of onset. LDL less than 100. Hemoglobin A1c. Passed a swallowing screen.  Was treated with labetalol initially for transition to oral medication secondary to low normal blood pressure.  Transient atrial fibrillation was noted with spontaneous conversion to sinus rhythm. TSH is normal.  Was seen in consultation with neurology, aspirin increased to 325 mg daily.  Hospitalization was prolonged by recurrent confusion and severe hypertension. EEG was abnormal and neurology start Keppra.  PMH HTN DVT approximately 5 years ago, not on anticoagulation. Has IVC filter. CKD GERD

## 2014-02-10 NOTE — Clinical Social Work Note (Signed)
[facility]. Pt, son, and facility aware and agreeable. D/C summary faxed. Pt to transfer with son.  Derenda FennelKara Keisy Strickler, [place] [phone]

## 2014-02-10 NOTE — Discharge Summary (Signed)
Physician Discharge Summary  Tracy DoppGarnette T Schlauch MVH:[id number]:[id number] DOB: 11/21/1927 DOA: 02/04/2014  PCP: Rudi HeapMOORE, DONALD, MD  Admit date: 02/04/2014 Discharge date: 02/10/2014  Time spent: 40 minutes  Recommendations for Outpatient Follow-up:  1. Follow up with PCP 1 week for evaluation of BP.   2.  [facility]. Recommend daily BP monitoring for 1 week or until follow up with PCP to evaluate BP control. 3. Follow up with Dr. Gerilyn Pilgrimoonquah 2 months for evaluation of CVA and abnormal EEG and tolerance of Keppra  Discharge Diagnoses:  Principal Problem:   Acute encephalopathy Active Problems:   GERD (gastroesophageal reflux disease)   HTN (hypertension)   Thyroid disease   UTI (lower urinary tract infection)   Hypertensive urgency   Acute on chronic renal failure   A-fib   CVA (cerebral infarction)   Abnormal EEG   Vaginal candidiasis   Discharge Condition: stable  Diet recommendation: Heart Healthy  Filed Weights   02/04/14 1700  Weight: 66.2 kg (145 lb 15.1 oz)    History of present illness:  Tracy Walker is a 80 y.o. female with a past medical history that includes hypertension, DVT no longer on anticoagulation and has a filter, thyroid disease, chronic kidney disease, presented to the emergency department with the chief complaint of fall. Information obtained from the son who was at the bedside as patient confused. Patient lives alone but is checked on frequently by family members and daily by police department. Family member reported that the police department called the patient every morning and that morning she did not answer the phone. Son stated that he spoke to the patient the evening prior around 9 PM and noted nothing out of the ordinary. When the family went to check on the patient  they found her on the floor. Patient had no recollection of when she fell or how she fell. There had been no recent illness fever or chills nausea vomiting diarrhea. She  had been eating and drinking her normal amount and there had been no unintentional weight loss. She does have a history of falling and is not steady on her feet due to a history of hip and knee replacements. Initial workup revealed hypertension at 228/122, EKG with SR with PAC's, creatinine 1.26 urinary tract infection. CT of the head cervical spine without acute abnormality. Chest x-ray and pelvis x-ray without acute abnormality. In the emergency department she received 1 g of Rocephin intravenously and Catapres 0.2 mg orally.   Hospital Course:  Acute CVA. Likely related to malignant hypertension. Patient was found to have an acute infarct in the right caudate with evidence of multiple small infarcts bilaterally concerning for cardioembolic phenomenon per neuro. Aspirin has been increased to 325 mg daily.not coumadin candidate due to fall. Echo with 65% EF and unable to eval LV function, Carotid doppler reveals no hemodynamically significant stenosis or plaque is noted in either cervical carotid artery. EEG results with left temporal epileptiform discharges. LDL was found to be less than 100. Hemoglobin A1c 5.6. Evaluated by neurology who started keppra and recommended continuing asa at 325mg .   Malignant hypertension. Patient on clonidine prior to admission. Somewhat labile, likely a component of this is rebound hypertension off clonidine. Norvasc and BB and HCTZ with fair control. Recommend daily BP monitoring for 1 week or until patient sees PCP for evaluation of BP control.    Acute encephalopathy: likely related to #2. Improved initially and then worsened 02/07/14 with uncontrolled BP. Evaluated by  neuro who opined that small right caudate nuclei infarct likely too small to cause the significant encephalopathy but patient with old small cerebral hemorrhage likely due to amyloid angiopathy which (per neuro) increases the risk of significant intracranial hemorrhage and dementia. Elevated homocysteine.  Folic acid started. At discharge much improved. Being transferred to snf.   Abnormal EEG. Left temporal epileptiform discharges. She has recurrent spells of confusion so keppra recommended empirically. Follow up with Dr. Gerilyn Pilgrim 2 months.   Transient atrial fibrillation. On admission, was noted the patient had atrial fibrillation. She has since converted to sinus rhythm. TSH 4.7. She does not appear to be a candidate for anticoagulation due to history of frequent falls. She also has a history of DVT in the past and has an IVC filter.   UTI. Urine culture shows no growth, given 3 days of rocephin.   Vaginal yeast infection. May be related to above. Discharge with diflucan 5 more days to complete 7 days.   Mild troponin leak. Likely related to severe hypertension. Echocardiogram shows no WMA. No further workup. She's not had any chest pain  Procedures: Echo:- Left ventricle: The cavity size was normal. There was moderate concentric hypertrophy. Systolic function was vigorous. The estimated ejection fraction was in the range of 65% to 70%. Wall motion was normal; there were no regional wall motion abnormalities. The study was not technically sufficient to allow evaluation of LV diastolic dysfunction due to atrial fibrillation. - Aortic valve: Trileaflet; mildly thickened, mildly calcified leaflets. Transvalvular velocity was within the normal range. There was no stenosis. Mild regurgitation. - Mitral valve: Calcified annulus. Mildly thickened leaflets . No regurgitation. - Left atrium: The atrium was mildly dilated. - Right ventricle: Systolic function was normal. - Pulmonary arteries: Systolic pressure was within the normal range.  EEG  1. Left temporal epileptiform discharges. 2. Moderate generalized slowing indicating a moderate generalized  encephalopathy. 3. Triphasic waves which are typically seen in metabolic  encephalopathies due to renal insufficiency/renal failure and   hepatic insufficiency/hepatic failure.   Consultations:  Neurology Dr. Gerilyn Pilgrim  Discharge Exam: Filed Vitals:   02/10/14 1300  BP: 137/75  Pulse: 81  Temp: 98.5 F (36.9 C)  Resp: 20    General: well nourished NAD Cardiovascular: RRR No MGR No LE edema Respiratory: normal effort BS clear bilaterally no wheeze Neuro: alert and oriented x2. Follow commands. Responds appropriately to questions/commands. Cranial nerve II-XII intact.   Discharge Instructions You were cared for by a hospitalist during your hospital stay. If you have any questions about your discharge medications or the care you received while you were in the hospital after you are discharged, you can call the unit and asked to speak with the hospitalist on call if the hospitalist that took care of you is not available. Once you are discharged, your primary care physician will handle any further medical issues. Please note that NO REFILLS for any discharge medications will be authorized once you are discharged, as it is imperative that you return to your primary care physician (or establish a relationship with a primary care physician if you do not have one) for your aftercare needs so that they can reassess your need for medications and monitor your lab values.       Future Appointments Provider Department Dept Phone   05/16/2014 10:30 AM Mary-Margaret Daphine Deutscher, FNP [place] [facility] [phone]       Medication List    STOP taking these medications  cloNIDine 0.2 MG tablet  Commonly known as:  CATAPRES     furosemide 20 MG tablet  Commonly known as:  LASIX      TAKE these medications       amLODipine 10 MG tablet  Commonly known as:  NORVASC  Take 1 tablet (10 mg total) by mouth daily.     aspirin 325 MG EC tablet  Take 1 tablet (325 mg total) by mouth at bedtime.     fluconazole 100 MG tablet  Commonly known as:  DIFLUCAN  Take 1 tab daily for 5 days to complete 7 days.      folic acid 1 MG tablet  Commonly known as:  FOLVITE  Take 1 tablet (1 mg total) by mouth daily.     hydrochlorothiazide 25 MG tablet  Commonly known as:  HYDRODIURIL  Take 1 tablet (25 mg total) by mouth daily.     HYDROcodone-acetaminophen 5-325 MG per tablet  Commonly known as:  NORCO/VICODIN  Take 1-2 tablets by mouth every 6 (six) hours as needed for moderate pain.     levETIRAcetam 250 MG tablet  Commonly known as:  KEPPRA  Take 1 tablet (250 mg total) by mouth 2 (two) times daily.     metoprolol 50 MG tablet  Commonly known as:  LOPRESSOR  Take 1 tablet (50 mg total) by mouth 2 (two) times daily.     multivitamin with minerals Tabs tablet  Take 1 tablet by mouth at bedtime.     nystatin 100000 UNIT/GM Powd  Apply BID to affected areas.     omeprazole 40 MG capsule  Commonly known as:  PRILOSEC  Take 40 mg by mouth daily.     tolterodine 4 MG 24 hr capsule  Commonly known as:  DETROL LA  Take 1 capsule (4 mg total) by mouth daily.       No Known Allergies Follow-up Information   Follow up with Rudi Heap, MD. Schedule an appointment as soon as possible for a visit in 2 weeks. (evaluate BP control. )    Specialty:  Family Medicine   Contact information:   [address] [place] [address] [phone]       Follow up with [facility], KOFI, MD. Schedule an appointment as soon as possible for a visit in 2 months. (follow up post hospital, CVA, abnormal EEG)    Specialty:  Neurology   Contact information:   [address] [place] [address] [phone]        The results of significant diagnostics from this hospitalization (including imaging, microbiology, ancillary and laboratory) are listed below for reference.    Significant Diagnostic Studies: Ct Head Wo Contrast  02/04/2014   CLINICAL DATA:  Fall.  Altered level of consciousness.  EXAM: CT HEAD WITHOUT CONTRAST  CT CERVICAL SPINE WITHOUT CONTRAST  TECHNIQUE: Multidetector CT imaging  of the head and cervical spine was performed following the standard protocol without intravenous contrast. Multiplanar CT image reconstructions of the cervical spine were also generated.  COMPARISON:  CT head 05/18/2008  FINDINGS: CT HEAD FINDINGS  Moderate atrophy. Chronic microvascular ischemic changes in the white matter.  Negative for acute infarct, hemorrhage, or mass lesion. Negative for skull fracture.  CT CERVICAL SPINE FINDINGS  Multilevel degenerative change in the cervical spine. There is disc and facet degeneration diffusely. 2 mm anterior slip at C3-4 consistent with disc and facet degeneration. There is a central disc protrusion at C3-4. 2 mm anterior slip C7-T1 also  appears degenerative. Spondylosis most prominent at C4-5 and C5-6 and C6-7. Multilevel foraminal encroachment secondary to spurring.  Negative for cervical spine fracture.  IMPRESSION: Atrophy and chronic microvascular ischemic change. No acute intracranial abnormality.  Cervical degenerative changes, moderate to advanced in degree. Negative for cervical spine fracture.   Electronically Signed   By: Marlan Palau M.D.   On: 02/04/2014 12:31   Ct Cervical Spine Wo Contrast  02/04/2014   CLINICAL DATA:  Fall.  Altered level of consciousness.  EXAM: CT HEAD WITHOUT CONTRAST  CT CERVICAL SPINE WITHOUT CONTRAST  TECHNIQUE: Multidetector CT imaging of the head and cervical spine was performed following the standard protocol without intravenous contrast. Multiplanar CT image reconstructions of the cervical spine were also generated.  COMPARISON:  CT head 05/18/2008  FINDINGS: CT HEAD FINDINGS  Moderate atrophy. Chronic microvascular ischemic changes in the white matter.  Negative for acute infarct, hemorrhage, or mass lesion. Negative for skull fracture.  CT CERVICAL SPINE FINDINGS  Multilevel degenerative change in the cervical spine. There is disc and facet degeneration diffusely. 2 mm anterior slip at C3-4 consistent with disc and facet  degeneration. There is a central disc protrusion at C3-4. 2 mm anterior slip C7-T1 also appears degenerative. Spondylosis most prominent at C4-5 and C5-6 and C6-7. Multilevel foraminal encroachment secondary to spurring.  Negative for cervical spine fracture.  IMPRESSION: Atrophy and chronic microvascular ischemic change. No acute intracranial abnormality.  Cervical degenerative changes, moderate to advanced in degree. Negative for cervical spine fracture.   Electronically Signed   By: Marlan Palau M.D.   On: 02/04/2014 12:31   Mr Brain Wo Contrast  02/04/2014   CLINICAL DATA:  Fall.  Confusion.  Hypertension.  EXAM: MRI HEAD WITHOUT CONTRAST  TECHNIQUE: Multiplanar, multiecho pulse sequences of the brain and surrounding structures were obtained without intravenous contrast.  COMPARISON:  02/04/2014 CT.  FINDINGS: Small acute nonhemorrhagic right caudate head infarct.  Right occipital lobe small infarct with minimal amount of blood breakdown products may represent a subacute infarct.  Remote left thalamic infarct.  Prominent small vessel disease type changes.  Scattered blood breakdown products throughout the supratentorial and infratentorial region. This may reflect changes of hemorrhagic ischemia. Episodes of prior trauma may also contribute to these areas of blood breakdown products. Cavernomas felt unlikely  No intracranial mass lesion noted on this unenhanced exam.  Major intracranial vascular structures are patent.  Cervical medullary junction, pituitary region, pineal region and orbital structures unremarkable.  Minimal paranasal sinus mucosal thickening.  IMPRESSION: Small acute nonhemorrhagic right caudate head infarct.  Right occipital lobe small infarct with minimal amount of blood breakdown products may represent a subacute infarct.  Remote left thalamic infarct.  Prominent small vessel disease type changes.  Scattered blood breakdown products throughout the supratentorial and infratentorial region.  This may reflect changes of hemorrhagic ischemia. Episodes of prior trauma could conceivably contribute to this finding.  These results were called by telephone at the time of interpretation on 02/04/2014 at 6:56 PM to [facility] patients nurse, who verbally acknowledged these results.   Electronically Signed   By: Bridgett Larsson M.D.   On: 02/04/2014 18:59   Dg Pelvis Portable  02/04/2014   CLINICAL DATA:  Fall yesterday, patient found in the floor earlier today complaining of pelvic pain.  EXAM: PORTABLE PELVIS 1-2 VIEWS  COMPARISON:  AP pelvis and left hip x-rays 04/10/2013, 12/11/2009.  FINDINGS: No acute fractures involving the bony pelvis or the proximal femora. Prior right hip arthroplasty with  anatomic alignment. Prior ORIF of an intertrochanteric left femoral neck fracture with healing. Generalized osseous demineralization. Degenerative changes involving the sacroiliac joints. Symphysis pubis intact. Degenerative changes involving the visualized lower lumbar spine.  IMPRESSION: No acute osseous abnormality.   Electronically Signed   By: Hulan Saashomas  Lawrence M.D.   On: 02/04/2014 12:17   Ultrasound Carotid Bilateral  02/05/2014   CLINICAL DATA:  Cerebrovascular accident.  EXAM: BILATERAL CAROTID DUPLEX ULTRASOUND  TECHNIQUE: Wallace CullensGray scale imaging, color Doppler and duplex ultrasound were performed of bilateral carotid and vertebral arteries in the neck.  COMPARISON:  None.  FINDINGS: Criteria: Quantification of carotid stenosis is based on velocity parameters that correlate the residual internal carotid diameter with NASCET-based stenosis levels, using the diameter of the distal internal carotid lumen as the denominator for stenosis measurement.  The following velocity measurements were obtained:  RIGHT  ICA:  71/15 cm/sec  CCA:  52/7 cm/sec  SYSTOLIC ICA/CCA RATIO:  1.35  DIASTOLIC ICA/CCA RATIO:  2.13  ECA:  87 cm/sec  LEFT  ICA:  78/19 cm/sec  CCA:  76/9 cm/sec  SYSTOLIC ICA/CCA RATIO:  1.04  DIASTOLIC ICA/CCA  RATIO:  2.16  ECA:  76 cm/sec  RIGHT CAROTID ARTERY: Minimal plaque formation is noted and proximal right internal carotid artery consistent with less than 50% diameter stenosis based on ultrasound and Doppler criteria.  RIGHT VERTEBRAL ARTERY:  Antegrade flow is noted.  LEFT CAROTID ARTERY: Minimal plaque formation is noted in the proximal left internal carotid artery consistent with less than 50% diameter stenosis based on ultrasound and Doppler criteria.  LEFT VERTEBRAL ARTERY:  Antegrade flow is noted.  IMPRESSION: No hemodynamically significant stenosis or plaque is noted in either cervical carotid artery.   Electronically Signed   By: Roque LiasJames  Green M.D.   On: 02/05/2014 11:24   Dg Chest Portable 1 View  02/04/2014   CLINICAL DATA:  Fall  EXAM: PORTABLE CHEST - 1 VIEW  COMPARISON:  10/17/2010  FINDINGS: Cardiac enlargement without heart failure.  Lungs are clear.  IMPRESSION: No active disease.   Electronically Signed   By: Marlan Palauharles  Clark M.D.   On: 02/04/2014 12:15    Microbiology: Recent Results (from the past 240 hour(s))  URINE CULTURE     Status: None   Collection Time    02/04/14 11:51 AM      Result Value Ref Range Status   Specimen Description URINE, CATHETERIZED   Final   Special Requests NONE   Final   Culture  Setup Time     Final   Value: 02/05/2014 01:21     Performed at [facility]   Colony Count     Final   Value: 5,000 COLONIES/ML     Performed at [facility]   Culture     Final   Value: INSIGNIFICANT GROWTH     Performed at [facility]   Report Status 02/06/2014 FINAL   Final  MRSA PCR SCREENING     Status: Abnormal   Collection Time    02/04/14  5:07 PM      Result Value Ref Range Status   MRSA by PCR POSITIVE (*) NEGATIVE Final   Comment:            The GeneXpert MRSA Assay (FDA     approved for NASAL specimens     only), is one component of a     comprehensive MRSA colonization     surveillance program. It is not  intended to  diagnose MRSA     infection nor to guide or     monitor treatment for     MRSA infections.     CRITICAL RESULT CALLED TO, READ BACK BY AND VERIFIED WITH:     L.KOGER AT 1928 ON 02/04/14 BY S.VANHOORNE     Labs: Basic Metabolic Panel:  Recent Labs Lab 02/04/14 1122 02/06/14 0556 02/07/14 0456 02/09/14 0505 02/10/14 0455  NA 142 140 142 142 143  K 3.4* 4.4 4.1 4.4 4.0  CL 101 103 104 106 104  CO2 27 26 26 26 26   GLUCOSE 121* 106* 122* 107* 98  BUN 18 16 11 10 13   CREATININE 1.26* 1.37* 1.10 1.20* 1.28*  CALCIUM 9.6 9.3 9.5 9.6 9.9  MG 2.2  --   --   --   --    Liver Function Tests:  Recent Labs Lab 02/04/14 1122  AST 36  ALT 15  ALKPHOS 57  BILITOT 0.8  PROT 7.7  ALBUMIN 3.6   No results found for this basename: LIPASE, AMYLASE,  in the last 168 hours No results found for this basename: AMMONIA,  in the last 168 hours CBC:  Recent Labs Lab 02/04/14 1122 02/06/14 0556 02/07/14 0456  WBC 10.2 6.9 7.6  NEUTROABS 8.4*  --   --   HGB 14.8 14.2 14.7  HCT 43.3 42.6 44.6  MCV 85.1 87.1 87.3  PLT 256 278 296   Cardiac Enzymes:  Recent Labs Lab 02/04/14 1122 02/04/14 1957 02/05/14 0543  CKTOTAL 320*  --   --   TROPONINI <0.30 0.31* <0.30   BNP: BNP (last 3 results) No results found for this basename: PROBNP,  in the last 8760 hours CBG: No results found for this basename: GLUCAP,  in the last 168 hours     Signed:  Lesle Chris Black  [facility] 02/10/2014, 1:45 PM

## 2014-02-10 NOTE — Clinical Social Work Placement (Signed)
Clinical Social Work Department CLINICAL SOCIAL WORK PLACEMENT NOTE 02/10/2014  Patient:  Tracy Walker,[name]  Account Number:  [id number] Admit date:  02/04/2014  Clinical Social Worker:  Derenda FennelKARA Aariah Godette, LCSW  Date/time:  02/08/2014 01:04 PM  Clinical Social Work is seeking post-discharge placement for this patient at the following level of care:   SKILLED NURSING   (*CSW will update this form in Epic as items are completed)   02/08/2014  [facility]family provided with [facility] of Clinical Social Work's list of facilities offering this level of care within the geographic area requested by the patient (or if unable, by the patient's family).  02/08/2014  Patient/family informed of their freedom to choose among providers that offer the needed level of care, that participate in Medicare, Medicaid or managed care program needed by the patient, have an available bed and are willing to accept the patient.  02/08/2014  Patient/family informed of [facility]' ownership interest in [facility], as well as of the fact that they are under no obligation to receive care at this facility.  PASARR submitted to EDS on  PASARR number received from EDS on   FL2 transmitted to all facilities in geographic area requested by pt/family on  02/08/2014 FL2 transmitted to all facilities within larger geographic area on   Patient informed that his/her managed care company has contracts with or will negotiate with  certain facilities, including the following:     Patient/family informed of bed offers received:  02/09/2014 [facility] Physician recommends and patient [facility]  [facility]  [facility] on  02/10/2014 Patient to be transferred to facility by family  The following physician request were entered in Epic:   Additional Comments: Pt has existing pasarr.  Derenda FennelKara [place], [place] [phone]

## 2014-02-10 NOTE — Progress Notes (Signed)
Patient ID: Tracy Walker, female   DOB: 1928-02-22, 80 y.o.   MRN: [id number]  [place] A. Merlene Laughter, MD     [email or web address]          Tracy Walker is an 80 y.o. female.   Assessment/Plan: 1. Acute on the mental status likely due to toxic metabolic encephalopathies. The patient does have small right caudate nuclei infarct but this seems to too small to cause the significant encephalopathy. There is evidence of multiple small infarcts bilaterally which raises suspicion of possible cardioembolic phenomenon. There appears to be a history of paroxysmal atrial fibrillation but warfarin therapy was thought not to be a good option given the history of falls from hip arthroplasty and an unsteady gait. Patient should continue with the current antiplatelet agent.     2. Multiple old small cerebral hemorrhage likely due to amyloid angiopathy which increases the risk of significant intracranial hemorrhage and dementia. Dementia labs are significant for elevated homocysteine level. Folic acid will be started.  3. Abnormal EEG showing effects from metabolic encephalopathies but also showing increased risk of epileptic seizures with left temporal epileptiform discharges. She has done well with Keppra. We'll continue with this. Follow-up with me in about 2 months.  The patient's is improved today. She is less confused and more with it.  GENERAL: She is in no acute distress. She is feeding herself breakfast this morning.  HEENT: Supple. Atraumatic normocephalic.  ABDOMEN: soft  EXTREMITIES: No edema  BACK: Normal.  SKIN: Normal by inspection.  MENTAL STATUS: She is awake and alert today. She is oriented to person and place but not time. She knows the season the hospital at [facility] in [place]. She is more lucid and coherent. She follows commands well. She is clearly improved today. CRANIAL NERVES: Pupils are equal, round and reactive to light and accommodation; extra ocular  movements are full, there is no significant nystagmus; visual fields - Limited but appears to be full; upper and lower facial muscles are normal in strength and symmetric, there is no flattening of the nasolabial folds; tongue is midline; uvula is midline; shoulder elevation is normal.  MOTOR: Normal tone, bulk and strength; no pronator drift.  COORDINATION: Left finger to nose is normal, right finger to nose is normal, No rest tremor; no intention tremor; no postural tremor; no bradykinesia.         Objective: Vital signs in last 24 hours: Temp:  [98 F (36.7 C)-98.3 F (36.8 C)] 98.2 F (36.8 C) (04/16 0528) Pulse Rate:  [61-77] 77 (04/16 0528) Resp:  [18-20] 20 (04/16 0528) BP: (113-173)/(54-93) 113/93 mmHg (04/16 0528) SpO2:  [97 %-100 %] 98 % (04/16 0528)  Intake/Output from previous day:   Intake/Output this shift:   Nutritional status: Cardiac   Lab Results: Results for orders placed during the hospital encounter of 02/04/14 (from the past 48 hour(s))  BASIC METABOLIC PANEL     Status: Abnormal   Collection Time    02/09/14  5:05 AM      Result Value Ref Range   Sodium 142  137 - 147 mEq/L   Potassium 4.4  3.7 - 5.3 mEq/L   Chloride 106  96 - 112 mEq/L   CO2 26  19 - 32 mEq/L   Glucose, Bld 107 (*) 70 - 99 mg/dL   BUN 10  6 - 23 mg/dL   Creatinine, Ser 1.20 (*) 0.50 - 1.10 mg/dL   Calcium 9.6  8.4 - 10.5 mg/dL  GFR calc non Af Amer 40 (*) >90 mL/min   GFR calc Af Amer 47 (*) >90 mL/min   Comment: (NOTE)     The eGFR has been calculated using the CKD EPI equation.     This calculation has not been validated in all clinical situations.     eGFR's persistently <90 mL/min signify possible Chronic Kidney     Disease.  BASIC METABOLIC PANEL     Status: Abnormal   Collection Time    02/10/14  4:55 AM      Result Value Ref Range   Sodium 143  137 - 147 mEq/L   Potassium 4.0  3.7 - 5.3 mEq/L   Chloride 104  96 - 112 mEq/L   CO2 26  19 - 32 mEq/L   Glucose, Bld  98  70 - 99 mg/dL   BUN 13  6 - 23 mg/dL   Creatinine, Ser 1.28 (*) 0.50 - 1.10 mg/dL   Calcium 9.9  8.4 - 10.5 mg/dL   GFR calc non Af Amer 37 (*) >90 mL/min   GFR calc Af Amer 43 (*) >90 mL/min   Comment: (NOTE)     The eGFR has been calculated using the CKD EPI equation.     This calculation has not been validated in all clinical situations.     eGFR's persistently <90 mL/min signify possible Chronic Kidney     Disease.    Lipid Panel No results found for this basename: CHOL, TRIG, HDL, CHOLHDL, VLDL, LDLCALC,  in the last 72 hours  Studies/Results: No results found.  Medications:  Scheduled Meds: . amLODipine  10 mg Oral Daily  . aspirin EC  325 mg Oral QHS  . enoxaparin (LOVENOX) injection  40 mg Subcutaneous Q24H  . fesoterodine  4 mg Oral Daily  . fluconazole  100 mg Oral Daily  . folic acid  1 mg Oral Daily  . hydrochlorothiazide  25 mg Oral Daily  . levETIRAcetam  250 mg Oral BID  . metoprolol tartrate  50 mg Oral BID  . multivitamin with minerals  1 tablet Oral QHS  . nystatin   Topical BID  . pantoprazole  40 mg Oral Daily   Continuous Infusions:  PRN Meds:.acetaminophen, acetaminophen, alum & mag hydroxide-simeth, hydrALAZINE, HYDROcodone-acetaminophen, ondansetron (ZOFRAN) IV, ondansetron     LOS: 6 days   Arisbeth Purrington A. Merlene Laughter, M.D.  Diplomate, Tax adviser of Psychiatry and Neurology ( Neurology).

## 2014-02-10 NOTE — Discharge Summary (Signed)
Patient seen, independently examined and chart reviewed. I agree with exam, assessment and plan discussed with Tracy SmothersKaren Black, NP.   Overall appears much improved with improving blood pressure and no gross focal neurologic deficits. In regard to acute right caudate nuclei infarct--neurology recommended aspirin.   Evidence of multiple small infarcts raise the possibility of cardioembolic phenomenon. However not felt to be a candidate for anticoagulation and therefore neurology recommended antiplatelet therapy with no further evaluation.   Urinalysis was equivocal but urine culture unremarkable. Nevertheless did receive 3 days of Rocephin.   Elevated troponin likely from hypertensive encephalopathy. Subsequent troponin normal. Echocardiogram unremarkable.   Abnormal EEG. No evidence of seizures. Keppra per neurology.   In regard to atrial fibrillation may have a history of paroxysmal atrial fibrillation. Subsequently noted to be in sinus rhythm. Not candidate for anticoagulation secondary to history of falls as well as neurology recommendations.   Statin not recommended by neurology. Has low LDL already. TSH equivocal. Followup as an outpatient.   Appears stable for transfer to rehabilitation today. Discussed with Dr. Gerilyn Walker, recommends no statin at this time based on age and LDL. Recommends ASA 325

## 2014-02-14 NOTE — Progress Notes (Signed)
UR chart review completed.  

## 2014-03-16 ENCOUNTER — Ambulatory Visit (INDEPENDENT_AMBULATORY_CARE_PROVIDER_SITE_OTHER): Payer: Medicare HMO | Admitting: Family Medicine

## 2014-03-16 ENCOUNTER — Encounter: Payer: Self-pay | Admitting: Family Medicine

## 2014-03-16 VITALS — BP 148/65 | HR 59 | Temp 97.3°F | Ht 65.0 in | Wt 144.0 lb

## 2014-03-16 DIAGNOSIS — N318 Other neuromuscular dysfunction of bladder: Secondary | ICD-10-CM

## 2014-03-16 DIAGNOSIS — N3281 Overactive bladder: Secondary | ICD-10-CM

## 2014-03-16 MED ORDER — METOPROLOL TARTRATE 50 MG PO TABS: 50.0000 mg | ORAL_TABLET | Freq: Two times a day (BID) | ORAL | Status: DC

## 2014-03-16 MED ORDER — LEVETIRACETAM 250 MG PO TABS: 250.0000 mg | ORAL_TABLET | Freq: Two times a day (BID) | ORAL | Status: DC

## 2014-03-16 MED ORDER — HYDROCHLOROTHIAZIDE 25 MG PO TABS: 25.0000 mg | ORAL_TABLET | Freq: Every day | ORAL | Status: DC

## 2014-03-16 MED ORDER — TOLTERODINE TARTRATE ER 4 MG PO CP24: 4.0000 mg | ORAL_CAPSULE | Freq: Every day | ORAL | Status: DC

## 2014-03-16 MED ORDER — AMLODIPINE BESYLATE 10 MG PO TABS: 10.0000 mg | ORAL_TABLET | Freq: Every day | ORAL | Status: DC

## 2014-03-16 NOTE — Progress Notes (Signed)
   Subjective:    Patient ID: Tracy Walker, female    DOB: 06/29/1928, 80 y.o.   MRN: [id number]  HPI This 80 y.o. female presents for evaluation of follow up from recent nursing home visit. She had acute CVA in April and she was transferred to rehab at nursing home and she is here for follow up.  She has new onset of atrial fibrillation. She has hx of DVT and she has IVC filter. She has hx of OAB, hypertension, and hypothyroidism.  She was taken off her anticoagulation meds due to frequent falls.   Review of Systems    No chest pain, SOB, HA, dizziness, vision change, N/V, diarrhea, constipation, dysuria, urinary urgency or frequency, myalgias, arthralgias or rash.  Objective:   Physical Exam Vital signs noted  Well developed well nourished elderly female with walker.  HEENT - Head atraumatic Normocephalic                Eyes - PERRLA, Conjuctiva - clear Sclera- Clear EOMI                Ears - EAC's Wnl TM's Wnl Gross Hearing WNL                Throat - oropharanx wnl Respiratory - Lungs CTA bilateral Cardiac - RRR S1 and S2 without murmur GI - Abdomen soft Nontender and bowel sounds active x 4 Extremities - No edema. Neuro - Grossly intact.       Assessment & Plan:  Overactive bladder - Plan: tolterodine (DETROL LA) 4 MG 24 hr capsule,  HTN - Controlled and continue current meds  CVA - No residuals, control bp  New onset Atrial fibrillation - Cardiology referral, anticoagulation not indicated due to hx of falls.  Deatra CanterWilliam J Valrie Jia FNP

## 2014-03-24 ENCOUNTER — Other Ambulatory Visit: Payer: Self-pay | Admitting: *Deleted

## 2014-03-24 DIAGNOSIS — N3281 Overactive bladder: Secondary | ICD-10-CM

## 2014-03-24 MED ORDER — TOLTERODINE TARTRATE ER 4 MG PO CP24: 4.0000 mg | ORAL_CAPSULE | Freq: Every day | ORAL | Status: DC

## 2014-03-24 MED ORDER — AMLODIPINE BESYLATE 10 MG PO TABS: 10.0000 mg | ORAL_TABLET | Freq: Every day | ORAL | Status: DC

## 2014-03-24 MED ORDER — METOPROLOL TARTRATE 50 MG PO TABS: 50.0000 mg | ORAL_TABLET | Freq: Two times a day (BID) | ORAL | Status: DC

## 2014-03-24 MED ORDER — HYDROCHLOROTHIAZIDE 25 MG PO TABS: 25.0000 mg | ORAL_TABLET | Freq: Every day | ORAL | Status: DC

## 2014-03-24 MED ORDER — LEVETIRACETAM 250 MG PO TABS: 250.0000 mg | ORAL_TABLET | Freq: Two times a day (BID) | ORAL | Status: DC

## 2014-04-27 ENCOUNTER — Ambulatory Visit (INDEPENDENT_AMBULATORY_CARE_PROVIDER_SITE_OTHER): Payer: Medicare HMO | Admitting: Physician Assistant

## 2014-04-27 ENCOUNTER — Encounter: Payer: Self-pay | Admitting: Physician Assistant

## 2014-04-27 VITALS — BP 180/78 | HR 68 | Temp 98.2°F

## 2014-04-27 DIAGNOSIS — M25561 Pain in right knee: Secondary | ICD-10-CM

## 2014-04-27 DIAGNOSIS — M25569 Pain in unspecified knee: Secondary | ICD-10-CM

## 2014-04-27 NOTE — Progress Notes (Signed)
Subjective:     Patient ID: Tracy Walker, female   DOB: 10/15/1928, 80 y.o.   MRN: [id number]  HPI Pt with R knee pain for several months Prev seen for same She has tried OTC NSAIDS w/o relief Sx worse over the last 2 weeks Prev hx of L TKA  Review of Systems + pain to the knee No locking/giving way of the knee No swelling Sx worse with ambulation    Objective:   Physical Exam No effusion/ecchy to the R knee FROM with crepitus + TTP anter/medial aspect No post TTP No calf/Achilles TTP Good strength Offered injection Consent obtained cc Marc/cc Kenalog under sterile tech to the R knee Bandage place    Assessment:     R knee pain    Plan:     Heat/Ice Activities as toil OTC med's for sx relief AF/U prn

## 2014-04-27 NOTE — Patient Instructions (Signed)

## 2014-05-16 ENCOUNTER — Ambulatory Visit: Payer: Medicare HMO | Admitting: General Practice

## 2014-05-16 ENCOUNTER — Ambulatory Visit: Payer: Medicare HMO | Admitting: Nurse Practitioner

## 2014-05-25 ENCOUNTER — Encounter: Payer: Self-pay | Admitting: Cardiology

## 2014-05-25 ENCOUNTER — Ambulatory Visit (INDEPENDENT_AMBULATORY_CARE_PROVIDER_SITE_OTHER): Payer: Commercial Managed Care - HMO | Admitting: Cardiology

## 2014-05-25 VITALS — BP 211/79 | HR 54 | Ht 65.0 in | Wt 147.0 lb

## 2014-05-25 DIAGNOSIS — I10 Essential (primary) hypertension: Secondary | ICD-10-CM | POA: Diagnosis not present

## 2014-05-25 DIAGNOSIS — I4891 Unspecified atrial fibrillation: Secondary | ICD-10-CM | POA: Diagnosis not present

## 2014-05-25 DIAGNOSIS — I4819 Other persistent atrial fibrillation: Secondary | ICD-10-CM

## 2014-05-25 MED ORDER — HYDRALAZINE HCL 10 MG PO TABS
10.0000 mg | ORAL_TABLET | Freq: Three times a day (TID) | ORAL | Status: DC
Start: 2014-05-25 — End: 2015-01-18

## 2014-05-25 NOTE — Patient Instructions (Signed)
Please start Hydralazine 10 mg one tablet 3 times a day. Continue all other medications as listed  [place] 4 [place]

## 2014-05-25 NOTE — Progress Notes (Signed)
HPI  the patient presents for new patient evaluation. She had a stroke in April. Review these records. She had a hypertensive urgency. It was felt also to be some evidence of emboli but no source. There was a mention of atrial fibrillation but I reviewed rhythm strips and EKGs and I don't see this. Regardless she was not started on blocker because of a fall risk. She did have an echocardiogram which was normal essentially with preserved ejection fraction. Since going home she's had no residual from her stroke her blood pressure has still been difficult to control.  She gets around with walker. The patient denies any new symptoms such as chest discomfort, neck or arm discomfort. There has been no new shortness of breath, PND or orthopnea. There have been no reported palpitations, presyncope or syncope.  No Known Allergies  Current Outpatient Prescriptions  Medication Sig Dispense Refill  . amLODipine (NORVASC) 10 MG tablet Take 1 tablet (10 mg total) by mouth daily.  90 tablet  1  . aspirin 81 MG tablet Take 81 mg by mouth daily.      . hydrochlorothiazide (HYDRODIURIL) 25 MG tablet Take 1 tablet (25 mg total) by mouth daily.  90 tablet  1  . levETIRAcetam (KEPPRA) 250 MG tablet Take 1 tablet (250 mg total) by mouth 2 (two) times daily.  180 tablet  1  . metoprolol (LOPRESSOR) 50 MG tablet Take 1 tablet (50 mg total) by mouth 2 (two) times daily.  180 tablet  1  . tolterodine (DETROL LA) 4 MG 24 hr capsule Take 1 capsule (4 mg total) by mouth daily.  90 capsule  1   No current facility-administered medications for this visit.    Past Medical History  Diagnosis Date  . GERD (gastroesophageal reflux disease)   . Hypertension   . Thyroid disease     subclinical hypothyroidism  . DVT (deep venous thrombosis)     IVC filter  . Overactive bladder   . CKD (chronic kidney disease)   . CVA (cerebral infarction)     Past Surgical History  Procedure Laterality Date  . Total hip arthroplasty       right  . Total knee arthroplasty      Left  . Abdominal hysterectomy    . Ivc filter      Family History  Problem Relation Age of Onset  . Pneumonia Mother 107    History   Social History  . Marital Status: Widowed    Spouse Name: N/A    Number of Children: N/A  . Years of Education: N/A   Occupational History  . Not on file.   Social History Main Topics  . Smoking status: Former Games developer  . Smokeless tobacco: Not on file  . Alcohol Use: No  . Drug Use: No  . Sexual Activity: Not on file   Other Topics Concern  . Not on file   Social History Narrative  . No narrative on file    ROS:  As stated in the HPI and negative for all other systems.  PHYSICAL EXAM BP 211/79  Pulse 54  Ht 5\' 5"  (1.651 m)  Wt 147 lb (66.679 kg)  BMI 24.46 kg/m2 PHYSICAL EXAM GEN:  No distress NECK:  No jugular venous distention at 90 degrees, waveform within normal limits, carotid upstroke brisk and symmetric, no bruits, no thyromegaly LYMPHATICS:  No cervical adenopathy LUNGS:  Clear to auscultation bilaterally BACK:  No CVA tenderness CHEST:  Unremarkable HEART:  S1 and S2 within normal limits, no S3, no S4, no clicks, no rubs, no murmurs ABD:  Positive bowel sounds normal in frequency in pitch, no bruits, no rebound, no guarding, unable to assess midline mass or bruit with the patient seated. EXT:  2 plus pulses throughout, mild edema, no cyanosis no clubbing SKIN:  No rashes no nodules NEURO:  Cranial nerves II through XII grossly intact, motor grossly intact throughout PSYCH:  Cognitively intact, oriented to person place and time   EKG:   Sinus bradycardia, rate 52 , left axis deviation, left anterior fascicular block, poor anterior R wave progression , no acute ST-T wave changes.  05/25/2014   ASSESSMENT AND PLAN  CVA:   I don't have any documentation of atrial fibrillation that I can find other than in the progress notes. I don't see any EEGs or rhythm strips. If I did have  a likely start her on anticoagulation. For now I will make no change to her medical regimen. I discussed this at length with her and her daughter.  HTN:    Her blood pressure is not well controlled. Because of renal insufficiency I will avoid ACE and ARB. I will start hydralazine 10 mg 3 times a day.

## 2014-06-15 ENCOUNTER — Telehealth: Payer: Self-pay | Admitting: Family Medicine

## 2014-06-15 NOTE — Telephone Encounter (Signed)
Please review and advise.

## 2014-06-15 NOTE — Telephone Encounter (Signed)
Pt.notified

## 2014-06-15 NOTE — Telephone Encounter (Signed)
Patient daughter wants to know if its okay to take benydrl with her medications

## 2014-06-15 NOTE — Telephone Encounter (Signed)
Try non-sedating med like Claritin or Allegra

## 2014-07-06 ENCOUNTER — Encounter: Payer: Self-pay | Admitting: Family Medicine

## 2014-07-06 ENCOUNTER — Ambulatory Visit (INDEPENDENT_AMBULATORY_CARE_PROVIDER_SITE_OTHER): Payer: Medicare HMO | Admitting: Family Medicine

## 2014-07-06 VITALS — BP 196/68 | HR 52 | Temp 98.3°F | Ht 65.0 in | Wt 158.0 lb

## 2014-07-06 DIAGNOSIS — I10 Essential (primary) hypertension: Secondary | ICD-10-CM

## 2014-07-06 DIAGNOSIS — E079 Disorder of thyroid, unspecified: Secondary | ICD-10-CM

## 2014-07-06 MED ORDER — HYDROCHLOROTHIAZIDE 25 MG PO TABS: 25.0000 mg | ORAL_TABLET | Freq: Every day | ORAL | Status: DC

## 2014-07-06 MED ORDER — METOPROLOL TARTRATE 50 MG PO TABS: 50.0000 mg | ORAL_TABLET | Freq: Two times a day (BID) | ORAL | Status: DC

## 2014-07-06 MED ORDER — AMLODIPINE BESYLATE 10 MG PO TABS: 10.0000 mg | ORAL_TABLET | Freq: Every day | ORAL | Status: DC

## 2014-07-06 NOTE — Patient Instructions (Signed)

## 2014-07-06 NOTE — Progress Notes (Signed)
   Subjective:    Patient ID: Tracy Walker, female    DOB: [birth year], 80 y.o.   MRN: [id number]  HPI 80 year old female who is here to check her blood pressure. She was recently started on hydralazine by the cardiologist. Her pressure in his office was over 200 systolic. When her pressures checked at home, her daughter normally gets systolic pressure in the 170s. She was supposed to have a history of atrial fib but neither the cardiologist or myself see any evidence of that. Her main complaint today is her overactive bladder. She is on Detrol LA but does not feel like it helps.    Review of Systems  Constitutional: Negative.   HENT: Negative.   Respiratory: Negative.   Cardiovascular: Negative.   Gastrointestinal: Negative.   Genitourinary: Negative.   Neurological: Negative.   Psychiatric/Behavioral: Negative.        Objective:   Physical Exam  Constitutional: She is oriented to person, place, and time. She appears well-developed and well-nourished.  Eyes: Conjunctivae and EOM are normal.  Neck: Normal range of motion. Neck supple.  Cardiovascular: Normal rate, regular rhythm and normal heart sounds.   Pulmonary/Chest: Effort normal and breath sounds normal.  Abdominal: Soft. Bowel sounds are normal.  Musculoskeletal: Normal range of motion.  Neurological: She is alert and oriented to person, place, and time. She has normal reflexes.  Skin: Skin is warm and dry.  Psychiatric: She has a normal mood and affect. Her behavior is normal. Thought content normal.    BP 196/68  Pulse 52  Temp(Src) 98.3 F (36.8 C) (Oral)  Ht  (1.651 m)  Wt 158 lb (71.668 kg)  BMI 26.29 kg/m2      Assessment & Plan:  1. Thyroid disease   2. Essential hypertension BP still too high but will leave meds same  Frederica Kuster MD

## 2014-08-12 ENCOUNTER — Other Ambulatory Visit: Payer: Self-pay

## 2014-09-07 ENCOUNTER — Encounter: Payer: Self-pay | Admitting: Family Medicine

## 2014-09-07 ENCOUNTER — Ambulatory Visit: Payer: Medicare HMO | Admitting: Family Medicine

## 2014-09-07 ENCOUNTER — Ambulatory Visit (INDEPENDENT_AMBULATORY_CARE_PROVIDER_SITE_OTHER): Payer: Medicare HMO | Admitting: Family Medicine

## 2014-09-07 ENCOUNTER — Telehealth: Payer: Self-pay | Admitting: Family Medicine

## 2014-09-07 ENCOUNTER — Ambulatory Visit (INDEPENDENT_AMBULATORY_CARE_PROVIDER_SITE_OTHER): Payer: Medicare HMO

## 2014-09-07 VITALS — BP 174/69 | HR 55 | Temp 97.4°F | Ht 65.0 in | Wt 160.0 lb

## 2014-09-07 DIAGNOSIS — M25561 Pain in right knee: Secondary | ICD-10-CM

## 2014-09-07 DIAGNOSIS — M1711 Unilateral primary osteoarthritis, right knee: Secondary | ICD-10-CM

## 2014-09-07 NOTE — Progress Notes (Signed)
Subjective:    Patient ID: Tracy Walker, female    DOB: 09/10/1928, 80 y.o.   MRN: [id number]  HPI Patient here today for right knee pain. She is asking for a cortisone shot. She had a shot on 04/27/14 here with a PA, and she said it worked well until recently. She is accompanied today by her daughter in law.         Patient Active Problem List   Diagnosis Date Noted  . Vaginal candidiasis 02/10/2014  . Abnormal EEG 02/08/2014  . UTI (lower urinary tract infection) 02/04/2014  . Acute encephalopathy 02/04/2014  . Hypertensive urgency 02/04/2014  . Acute on chronic renal failure 02/04/2014  . A-fib 02/04/2014  . CVA (cerebral infarction) 02/04/2014  . Overactive bladder   . Fatigue 08/30/2013  . Thyroid disease   . DVT (deep venous thrombosis)   . DVT of lower extremity (deep venous thrombosis) 08/09/2013  . Venous stasis of lower extremity 08/02/2013  . GERD (gastroesophageal reflux disease) 04/01/2013  . HTN (hypertension) 04/01/2013   Outpatient Encounter Prescriptions as of 09/07/2014  Medication Sig  . amLODipine (NORVASC) 10 MG tablet Take 1 tablet (10 mg total) by mouth daily.  Marland Kitchen. aspirin 81 MG tablet Take 81 mg by mouth daily.  . hydrALAZINE (APRESOLINE) 10 MG tablet Take 1 tablet (10 mg total) by mouth 3 (three) times daily.  . hydrochlorothiazide (HYDRODIURIL) 25 MG tablet Take 1 tablet (25 mg total) by mouth daily.  Marland Kitchen. levETIRAcetam (KEPPRA) 250 MG tablet Take 1 tablet (250 mg total) by mouth 2 (two) times daily.  . metoprolol (LOPRESSOR) 50 MG tablet Take 1 tablet (50 mg total) by mouth 2 (two) times daily.  Marland Kitchen. tolterodine (DETROL LA) 4 MG 24 hr capsule Take 1 capsule (4 mg total) by mouth daily.    Review of Systems  Constitutional: Negative.   HENT: Negative.   Eyes: Negative.   Respiratory: Negative.   Cardiovascular: Negative.   Gastrointestinal: Negative.   Endocrine: Negative.   Genitourinary: Negative.   Musculoskeletal: Positive for arthralgias  (right knee pain).  Skin: Negative.   Allergic/Immunologic: Negative.   Neurological: Negative.   Hematological: Negative.   Psychiatric/Behavioral: Negative.        Objective:   Physical Exam  Constitutional: She is oriented to person, place, and time. She appears well-developed and well-nourished. No distress.  Elderly and pleasant with her daughter-in-law  HENT:  Head: Normocephalic.  Musculoskeletal: She exhibits tenderness.  The patient has limited range of motion and is currently using a walker to help with her stability.she has stiffness and tenderness especially at the medial joint line of the right knee. There is minimal swelling in the lower leg  Neurological: She is alert and oriented to person, place, and time.  Skin: Skin is warm and dry. No rash noted.  Psychiatric: She has a normal mood and affect. Her behavior is normal. Judgment and thought content normal.  Nursing note and vitals reviewed.  BP 174/69 mmHg  Pulse 55  Temp(Src) 97.4 F (36.3 C) (Oral)  Ht 5\' 5"  (1.651 m)  Wt 160 lb (72.576 kg)  BMI 26.63 kg/m2        Assessment & Plan:  1. Right knee pain - DG Knee 1-2 Views Right; Future  2. Primary osteoarthritis of right knee   Patient Instructions  Use warm wet compresses to affected knee 20 minutes 3 or 4 times daily Continue to use the walker Do not put herself at risk for falling  Arrie Senate MD

## 2014-09-07 NOTE — Telephone Encounter (Signed)
Done

## 2014-09-07 NOTE — Patient Instructions (Signed)
Use warm wet compresses to affected knee 20 minutes 3 or 4 times daily Continue to use the walker Do not put herself at risk for falling

## 2014-09-15 ENCOUNTER — Other Ambulatory Visit: Payer: Self-pay | Admitting: Family Medicine

## 2014-09-24 ENCOUNTER — Other Ambulatory Visit: Payer: Self-pay | Admitting: Family Medicine

## 2014-09-29 ENCOUNTER — Encounter: Payer: Self-pay | Admitting: *Deleted

## 2014-10-05 ENCOUNTER — Other Ambulatory Visit: Payer: Self-pay | Admitting: Family Medicine

## 2014-10-05 ENCOUNTER — Ambulatory Visit: Payer: Commercial Managed Care - HMO | Admitting: Cardiology

## 2014-10-19 ENCOUNTER — Ambulatory Visit: Payer: Medicare HMO | Admitting: Family Medicine

## 2014-12-01 DIAGNOSIS — M1711 Unilateral primary osteoarthritis, right knee: Secondary | ICD-10-CM | POA: Diagnosis not present

## 2014-12-08 DIAGNOSIS — M1711 Unilateral primary osteoarthritis, right knee: Secondary | ICD-10-CM | POA: Diagnosis not present

## 2014-12-15 DIAGNOSIS — M1711 Unilateral primary osteoarthritis, right knee: Secondary | ICD-10-CM | POA: Diagnosis not present

## 2015-01-02 ENCOUNTER — Other Ambulatory Visit: Payer: Self-pay | Admitting: *Deleted

## 2015-01-02 MED ORDER — TOLTERODINE TARTRATE ER 4 MG PO CP24: 4.0000 mg | ORAL_CAPSULE | Freq: Every day | ORAL | Status: DC

## 2015-01-02 NOTE — Telephone Encounter (Signed)
Detrol refill authorized.  Patient has appt at end of March.

## 2015-01-18 ENCOUNTER — Encounter: Payer: Self-pay | Admitting: Family Medicine

## 2015-01-18 ENCOUNTER — Ambulatory Visit (INDEPENDENT_AMBULATORY_CARE_PROVIDER_SITE_OTHER): Payer: Commercial Managed Care - HMO | Admitting: Family Medicine

## 2015-01-18 VITALS — BP 170/67 | HR 58 | Temp 97.1°F | Ht 65.0 in | Wt 154.0 lb

## 2015-01-18 DIAGNOSIS — R8290 Unspecified abnormal findings in urine: Secondary | ICD-10-CM | POA: Diagnosis not present

## 2015-01-18 LAB — POCT UA - MICROSCOPIC ONLY
CASTS, UR, LPF, POC: NEGATIVE
CRYSTALS, UR, HPF, POC: NEGATIVE
YEAST UA: NEGATIVE

## 2015-01-18 LAB — POCT URINALYSIS DIPSTICK
Bilirubin, UA: NEGATIVE
Glucose, UA: NEGATIVE
KETONES UA: NEGATIVE
NITRITE UA: POSITIVE
PH UA: 6
Spec Grav, UA: <=1.005
Urobilinogen, UA: NEGATIVE

## 2015-01-18 MED ORDER — MIRABEGRON ER 25 MG PO TB24: 25.0000 mg | ORAL_TABLET | Freq: Every day | ORAL | Status: DC

## 2015-01-18 NOTE — Progress Notes (Signed)
   Subjective:    Patient ID: Tracy Walker, female    DOB: 10/15/1928, [age] y.o.   MRN: [id number]  HPI [id number]-year-old lady here with high blood pressure and urinary symptoms. Her blood pressure continues to be elevated despite multiple drug regimen. She also complains of some incontinence and overactive bladder symptoms We spent a fair amount of time discussing a strategy for her bladder. One plan is to have her empty her bladder regularly while she is awake to prevent overflow incontinence. Daughter also describes an odor to her urine and urinalysis will be checked. She has no other complaints today  Patient Active Problem List   Diagnosis Date Noted  . Vaginal candidiasis 02/10/2014  . Abnormal EEG 02/08/2014  . UTI (lower urinary tract infection) 02/04/2014  . Acute encephalopathy 02/04/2014  . Hypertensive urgency 02/04/2014  . Acute on chronic renal failure 02/04/2014  . A-fib 02/04/2014  . CVA (cerebral infarction) 02/04/2014  . Overactive bladder   . Fatigue 08/30/2013  . Thyroid disease   . DVT (deep venous thrombosis)   . DVT of lower extremity (deep venous thrombosis) 08/09/2013  . Venous stasis of lower extremity 08/02/2013  . GERD (gastroesophageal reflux disease) 04/01/2013  . HTN (hypertension) 04/01/2013   Outpatient Encounter Prescriptions as of 01/18/2015  Medication Sig  . amLODipine (NORVASC) 10 MG tablet Take 1 tablet (10 mg total) by mouth daily.  Marland Kitchen. aspirin 81 MG tablet Take 81 mg by mouth daily.  . hydrALAZINE (APRESOLINE) 10 MG tablet Take 10 mg by mouth 3 (three) times daily.  . hydrochlorothiazide (HYDRODIURIL) 25 MG tablet Take 1 tablet (25 mg total) by mouth daily.  Marland Kitchen. levETIRAcetam (KEPPRA) 250 MG tablet TAKE 1 TABLET (250 MG TOTAL) BY MOUTH 2 (TWO) TIMES DAILY.  . metoprolol (LOPRESSOR) 50 MG tablet Take 1 tablet (50 mg total) by mouth 2 (two) times daily.  Marland Kitchen. tolterodine (DETROL LA) 4 MG 24 hr capsule Take 1 capsule (4 mg total) by mouth daily.  .  [DISCONTINUED] amLODipine (NORVASC) 10 MG tablet TAKE 1 TABLET (10 MG TOTAL) BY MOUTH DAILY.  . [DISCONTINUED] hydrALAZINE (APRESOLINE) 10 MG tablet Take 1 tablet (10 mg total) by mouth 3 (three) times daily.  . [DISCONTINUED] hydrochlorothiazide (HYDRODIURIL) 25 MG tablet TAKE 1 TABLET (25 MG TOTAL) BY MOUTH DAILY.     Review of Systems  Constitutional: Negative.   Respiratory: Negative.   Cardiovascular: Negative.   Genitourinary: Positive for frequency.       Objective:   Physical Exam  Constitutional: She is oriented to person, place, and time. She appears well-developed.  Cardiovascular: Normal rate.   Pulmonary/Chest: Effort normal.  Neurological: She is alert and oriented to person, place, and time.   BP 170/67 mmHg  Pulse 58  Temp(Src) 97.1 F (36.2 C) (Oral)  Ht 5\' 5"  (1.651 m)  Wt 154 lb (69.854 kg)  BMI 25.63 kg/m2      Assessment & Plan:  1. Abnormal urine odor Urinalysis suggests infection. Will await results of culture before initiating treatment. Regarding overactive bladder and incontinence have suggested that we hold the diuretic and increase hydralazine for blood pressure. After infection has been treated will try a sample of Myrbetriq in place of Detrol - POCT urinalysis dipstick - POCT UA - Microscopic Only  Frederica KusterStephen M Miller MD

## 2015-01-23 ENCOUNTER — Telehealth: Payer: Self-pay | Admitting: Family Medicine

## 2015-01-24 ENCOUNTER — Other Ambulatory Visit: Payer: Self-pay | Admitting: *Deleted

## 2015-01-24 NOTE — Telephone Encounter (Signed)
Urine culture was not done. Do you want to prescribe an antibiotic based on UA and return for culture if not resolved or would you like her to bring a urine specimen by for culture?

## 2015-01-25 NOTE — Telephone Encounter (Signed)
Let's do C&S

## 2015-01-26 ENCOUNTER — Telehealth: Payer: Self-pay | Admitting: Family Medicine

## 2015-01-26 ENCOUNTER — Other Ambulatory Visit: Payer: Commercial Managed Care - HMO

## 2015-01-26 DIAGNOSIS — R300 Dysuria: Secondary | ICD-10-CM

## 2015-01-26 NOTE — Progress Notes (Signed)
Lab only 

## 2015-01-26 NOTE — Telephone Encounter (Signed)
Spoke with Tracy Walker. She will either bring Ozell by this afternoon or come by and pickup a specimen cup.

## 2015-01-26 NOTE — Telephone Encounter (Signed)
Taken care of in another encounter 

## 2015-01-28 LAB — URINE CULTURE

## 2015-01-30 ENCOUNTER — Other Ambulatory Visit: Payer: Self-pay | Admitting: *Deleted

## 2015-01-30 MED ORDER — CIPROFLOXACIN HCL 250 MG PO TABS: 250.0000 mg | ORAL_TABLET | Freq: Two times a day (BID) | ORAL | Status: DC

## 2015-03-06 ENCOUNTER — Telehealth: Payer: Self-pay | Admitting: Family Medicine

## 2015-03-06 NOTE — Telephone Encounter (Signed)
Patients daughter aware not to use aleve with her elevated kidney functions that she should try acetaminophen.

## 2015-03-13 DIAGNOSIS — M25561 Pain in right knee: Secondary | ICD-10-CM | POA: Diagnosis not present

## 2015-03-13 DIAGNOSIS — M1711 Unilateral primary osteoarthritis, right knee: Secondary | ICD-10-CM | POA: Diagnosis not present

## 2015-04-02 ENCOUNTER — Other Ambulatory Visit: Payer: Self-pay | Admitting: Nurse Practitioner

## 2015-04-24 ENCOUNTER — Other Ambulatory Visit: Payer: Self-pay

## 2015-05-16 ENCOUNTER — Other Ambulatory Visit: Payer: Self-pay | Admitting: Cardiology

## 2015-05-16 NOTE — Telephone Encounter (Signed)
Rx(s) sent to pharmacy electronically.  

## 2015-06-05 DIAGNOSIS — M1711 Unilateral primary osteoarthritis, right knee: Secondary | ICD-10-CM | POA: Diagnosis not present

## 2015-06-11 ENCOUNTER — Other Ambulatory Visit: Payer: Self-pay | Admitting: Cardiology

## 2015-06-12 ENCOUNTER — Other Ambulatory Visit: Payer: Self-pay | Admitting: *Deleted

## 2015-06-12 ENCOUNTER — Telehealth: Payer: Self-pay | Admitting: Family Medicine

## 2015-06-12 MED ORDER — AMLODIPINE BESYLATE 10 MG PO TABS: 10.0000 mg | ORAL_TABLET | Freq: Every day | ORAL | Status: DC

## 2015-06-12 MED ORDER — HYDROCHLOROTHIAZIDE 25 MG PO TABS: 25.0000 mg | ORAL_TABLET | Freq: Every day | ORAL | Status: DC

## 2015-06-12 NOTE — Telephone Encounter (Signed)
Patients daughter notified that rxs sent to pharmacy

## 2015-06-15 ENCOUNTER — Ambulatory Visit (INDEPENDENT_AMBULATORY_CARE_PROVIDER_SITE_OTHER): Payer: Commercial Managed Care - HMO | Admitting: Family Medicine

## 2015-06-15 ENCOUNTER — Ambulatory Visit (INDEPENDENT_AMBULATORY_CARE_PROVIDER_SITE_OTHER): Payer: Commercial Managed Care - HMO

## 2015-06-15 ENCOUNTER — Encounter: Payer: Self-pay | Admitting: Family Medicine

## 2015-06-15 VITALS — BP 181/71 | HR 52 | Temp 97.3°F | Ht 65.0 in | Wt 147.4 lb

## 2015-06-15 DIAGNOSIS — M25511 Pain in right shoulder: Secondary | ICD-10-CM

## 2015-06-15 DIAGNOSIS — M75100 Unspecified rotator cuff tear or rupture of unspecified shoulder, not specified as traumatic: Secondary | ICD-10-CM | POA: Insufficient documentation

## 2015-06-15 DIAGNOSIS — M75101 Unspecified rotator cuff tear or rupture of right shoulder, not specified as traumatic: Secondary | ICD-10-CM

## 2015-06-15 MED ORDER — METHYLPREDNISOLONE ACETATE 40 MG/ML IJ SUSP
40.0000 mg | Freq: Once | INTRAMUSCULAR | Status: AC
  Administered 2015-06-15: 40 mg via INTRAMUSCULAR

## 2015-06-15 MED ORDER — LIDOCAINE HCL 2 % IJ SOLN
1.0000 mL | Freq: Once | INTRAMUSCULAR | Status: AC
  Administered 2015-06-15: 20 mg

## 2015-06-15 MED ORDER — METHYLPREDNISOLONE ACETATE 80 MG/ML IJ SUSP
80.0000 mg | Freq: Once | INTRAMUSCULAR | Status: DC
Start: 2015-06-15 — End: 2015-06-15

## 2015-06-15 NOTE — Assessment & Plan Note (Signed)
Patient likely has a rotator cuff tear based on exam and history. She does not want to go see a Careers adviser and has no plans to get it fixed in that method. I discussed with her the options of doing anti-inflammatories and physical therapy and giving her a steroid injection. She prefers to go that route and go to see a Careers adviser.

## 2015-06-15 NOTE — Progress Notes (Signed)
BP 181/71 mmHg  Pulse 52  Temp(Src) 97.3 F (36.3 C) (Oral)  Ht  (1.651 m)  Wt 147 lb 6.4 oz (66.86 kg)  BMI 24.53 kg/m2   Subjective:    Patient ID: Tracy Walker, female    DOB: 18-Apr-1928, 81 y.o.   MRN: [id number]  HPI: Tracy Walker is a 81 y.o. female presenting on 06/15/2015 for Shoulder Pain and Sore Throat   HPI Right shoulder pain Patient presents today with a one-day history of right shoulder pain. She awoke yesterday morning with this right shoulder pain and difficulty raising her arm overhead. She has never had this pain before and has never had any major joint issues before. She cannot raise her hand over her head actively she makes it to about 100 before she is unable to go any further, her other arm does go up past almost vertical. She does not recall any specific injury or fall that brought this on. She has been attempting to use Advil with minimal improvement. She denies any fevers or chills or warmth or redness of the shoulder. She does admit to having difficulty sleeping on that shoulder at night.  Relevant past medical, surgical, family and social history reviewed and updated as indicated. Interim medical history since our last visit reviewed. Allergies and medications reviewed and updated.  Review of Systems  Constitutional: Negative for fever and chills.  HENT: Negative for congestion, ear discharge and ear pain.   Eyes: Negative for redness and visual disturbance.  Respiratory: Negative for chest tightness and shortness of breath.   Cardiovascular: Negative for chest pain and leg swelling.  Genitourinary: Negative for dysuria and difficulty urinating.  Musculoskeletal: Positive for arthralgias (pain in right lateral upper shoulder). Negative for myalgias, back pain, joint swelling, gait problem and neck pain.  Skin: Negative for rash.  Neurological: Negative for light-headedness and headaches.  Psychiatric/Behavioral: Negative for behavioral  problems and agitation.  All other systems reviewed and are negative.   Per HPI unless specifically indicated above     Medication List       This list is accurate as of: 06/15/15  1:36 PM.  Always use your most recent med list.               amLODipine 10 MG tablet  Commonly known as:  NORVASC  Take 1 tablet (10 mg total) by mouth daily.     aspirin 81 MG tablet  Take 81 mg by mouth daily.     hydrALAZINE 10 MG tablet  Commonly known as:  APRESOLINE  Take 10 mg by mouth 3 (three) times daily.     hydrALAZINE 10 MG tablet  Commonly known as:  APRESOLINE  Take 1 tablet (10 mg total) by mouth 3 (three) times daily. PATIENT NEEDS TO CONTACT OFFICE FOR ADDITIONAL     hydrochlorothiazide 25 MG tablet  Commonly known as:  HYDRODIURIL  Take 1 tablet (25 mg total) by mouth daily.     levETIRAcetam 250 MG tablet  Commonly known as:  KEPPRA  TAKE 1 TABLET (250 MG TOTAL) BY MOUTH 2 (TWO) TIMES DAILY.     metoprolol 50 MG tablet  Commonly known as:  LOPRESSOR  TAKE 1 TABLET (50 MG TOTAL) BY MOUTH 2 (TWO) TIMES DAILY.     naproxen sodium 220 MG tablet  Commonly known as:  ANAPROX  Take 440 mg by mouth daily.     tolterodine 4 MG 24 hr capsule  Commonly known as:  DETROL LA  TAKE 1 CAPSULE (4 MG TOTAL) BY MOUTH DAILY.           Objective:    BP 181/71 mmHg  Pulse 52  Temp(Src) 97.3 F (36.3 C) (Oral)  Ht 5\' 5"  (1.651 m)  Wt 147 lb 6.4 oz (66.86 kg)  BMI 24.53 kg/m2  Wt Readings from Last 3 Encounters:  06/15/15 147 lb 6.4 oz (66.86 kg)  01/18/15 154 lb (69.854 kg)  09/07/14 160 lb (72.576 kg)    Physical Exam  Constitutional: She is oriented to person, place, and time. She appears well-developed and well-nourished. No distress.  Eyes: Conjunctivae and EOM are normal. Pupils are equal, round, and reactive to light.  Cardiovascular: Normal rate and regular rhythm.   No murmur heard. Pulmonary/Chest: Effort normal and breath sounds normal. No respiratory  distress. She has no wheezes.  Musculoskeletal: She exhibits no edema.       Right shoulder: She exhibits decreased range of motion (cannot go past medical degrees on abduction overhead.), tenderness (over the acromion process, patient also has pain with internal rotation but not with external rotation and impingement sign is negative.), bony tenderness and crepitus. She exhibits no swelling, no effusion and no deformity.       Arms: Neurological: She is alert and oriented to person, place, and time. Coordination normal.  Skin: Skin is warm and dry. No rash noted. She is not diaphoretic.  Psychiatric: She has a normal mood and affect. Her behavior is normal.  Vitals reviewed.  Chest x-ray: Preliminary read by Dr. Louanne Skye- no apparent fractures can be visualized, no major dislocations or malformities. Await final read by radiologist for full confirmation.  Results for orders placed or performed in visit on 01/26/15  Urine culture  Result Value Ref Range   Urine Culture, Routine Final report (A)    Urine Culture result 1 Escherichia coli (A)    ANTIMICROBIAL SUSCEPTIBILITY Comment       Assessment & Plan:   Problem List Items Addressed This Visit      Musculoskeletal and Integument   Rotator cuff tear - Primary    Patient likely has a rotator cuff tear based on exam and history. She does not want to go see a Careers adviser and has no plans to get it fixed in that method. I discussed with her the options of doing anti-inflammatories and physical therapy and giving her a steroid injection. She prefers to go that route and go to see a Careers adviser.      Relevant Medications   lidocaine (XYLOCAINE) 2 % (with pres) injection 20 mg (Completed)   Other Relevant Orders   DG Shoulder Right (Completed)   Ambulatory referral to Physical Therapy    Other Visit Diagnoses    Right shoulder pain        Relevant Medications    methylPREDNISolone acetate (DEPO-MEDROL) injection 40 mg (Completed)         Shoulder injection: Posterior approach to right shoulder injection, gave 40 mg of Solu-Medrol and 1 mL of 2% lidocaine. Tolerated well by patient.  Follow up plan: Return in about 3 months (around 09/15/2015), or if symptoms worsen or fail to improve.  Arville Care, MD [facility] 06/15/2015, 1:36 PM

## 2015-06-15 NOTE — Patient Instructions (Signed)
Rotator Cuff Injury Rotator cuff injury is any type of injury to the set of muscles and tendons that make up the stabilizing unit of your shoulder. This unit holds the ball of your upper arm bone (humerus) in the socket of your shoulder blade (scapula).  CAUSES Injuries to your rotator cuff most commonly come from sports or activities that cause your arm to be moved repeatedly over your head. Examples of this include throwing, weight lifting, swimming, or racquet sports. Long lasting (chronic) irritation of your rotator cuff can cause soreness and swelling (inflammation), bursitis, and eventual damage to your tendons, such as a tear (rupture). SIGNS AND SYMPTOMS Acute rotator cuff tear:  Sudden tearing sensation followed by severe pain shooting from your upper shoulder down your arm toward your elbow.  Decreased range of motion of your shoulder because of pain and muscle spasm.  Severe pain.  Inability to raise your arm out to the side because of pain and loss of muscle power (large tears). Chronic rotator cuff tear:  Pain that usually is worse at night and may interfere with sleep.  Gradual weakness and decreased shoulder motion as the pain worsens.  Decreased range of motion. Rotator cuff tendinitis:  Deep ache in your shoulder and the outside upper arm over your shoulder.  Pain that comes on gradually and becomes worse when lifting your arm to the side or turning it inward. DIAGNOSIS Rotator cuff injury is diagnosed through a medical history, physical exam, and imaging exam. The medical history helps determine the type of rotator cuff injury. Your health care provider will look at your injured shoulder, feel the injured area, and ask you to move your shoulder in different positions. X-ray exams typically are done to rule out other causes of shoulder pain, such as fractures. MRI is the exam of choice for the most severe shoulder injuries because the images show muscles and tendons.    TREATMENT  Chronic tear:  Medicine for pain, such as acetaminophen or ibuprofen.  Physical therapy and range-of-motion exercises may be helpful in maintaining shoulder function and strength.  Steroid injections into your shoulder joint.  Surgical repair of the rotator cuff if the injury does not heal with noninvasive treatment. Acute tear:  Anti-inflammatory medicines such as ibuprofen and naproxen to help reduce pain and swelling.  A sling to help support your arm and rest your rotator cuff muscles. Long-term use of a sling is not advised. It may cause significant stiffening of the shoulder joint.  Surgery may be considered within a few weeks, especially in younger, active people, to return the shoulder to full function.  Indications for surgical treatment include the following:  Age younger than 60 years.  Rotator cuff tears that are complete.  Physical therapy, rest, and anti-inflammatory medicines have been used for 6-8 weeks, with no improvement.  Employment or sporting activity that requires constant shoulder use. Tendinitis:  Anti-inflammatory medicines such as ibuprofen and naproxen to help reduce pain and swelling.  A sling to help support your arm and rest your rotator cuff muscles. Long-term use of a sling is not advised. It may cause significant stiffening of the shoulder joint.  Severe tendinitis may require:  Steroid injections into your shoulder joint.  Physical therapy.  Surgery. HOME CARE INSTRUCTIONS   Apply ice to your injury:  Put ice in a plastic bag.  Place a towel between your skin and the bag.  Leave the ice on for 20 minutes, 2-3 times a day.  If you   have a shoulder immobilizer (sling and straps), wear it until told otherwise by your health care provider.  You may want to sleep on several pillows or in a recliner at night to lessen swelling and pain.  Only take over-the-counter or prescription medicines for pain, discomfort, or fever as  directed by your health care provider.  Do simple hand squeezing exercises with a soft rubber ball to decrease hand swelling. SEEK MEDICAL CARE IF:   Your shoulder pain increases, or new pain or numbness develops in your arm, hand, or fingers.  Your hand or fingers are colder than your other hand. SEEK IMMEDIATE MEDICAL CARE IF:   Your arm, hand, or fingers are numb or tingling.  Your arm, hand, or fingers are increasingly swollen and painful, or they turn white or blue. MAKE SURE YOU:  Understand these instructions.  Will watch your condition.  Will get help right away if you are not doing well or get worse. Document Released: 10/11/2000 Document Revised: 10/19/2013 Document Reviewed: 05/26/2013 Exit[employer] Patient Information 2015 [employer]. This information is not intended to replace advice given to you by your health care provider. Make sure you discuss any questions you have with your health care provider.  

## 2015-06-29 ENCOUNTER — Other Ambulatory Visit: Payer: Self-pay | Admitting: Family Medicine

## 2015-06-29 ENCOUNTER — Encounter: Payer: Self-pay | Admitting: Family Medicine

## 2015-06-29 ENCOUNTER — Ambulatory Visit (INDEPENDENT_AMBULATORY_CARE_PROVIDER_SITE_OTHER): Payer: Commercial Managed Care - HMO | Admitting: Family Medicine

## 2015-06-29 VITALS — BP 161/63 | HR 54 | Temp 97.8°F | Ht 65.0 in | Wt 147.0 lb

## 2015-06-29 DIAGNOSIS — I63312 Cerebral infarction due to thrombosis of left middle cerebral artery: Secondary | ICD-10-CM

## 2015-06-29 DIAGNOSIS — I10 Essential (primary) hypertension: Secondary | ICD-10-CM | POA: Diagnosis not present

## 2015-06-29 DIAGNOSIS — I82403 Acute embolism and thrombosis of unspecified deep veins of lower extremity, bilateral: Secondary | ICD-10-CM | POA: Diagnosis not present

## 2015-06-29 DIAGNOSIS — N3281 Overactive bladder: Secondary | ICD-10-CM | POA: Diagnosis not present

## 2015-06-29 MED ORDER — LOSARTAN POTASSIUM 50 MG PO TABS: 50.0000 mg | ORAL_TABLET | Freq: Every day | ORAL | Status: DC

## 2015-06-29 NOTE — Progress Notes (Signed)
Subjective:    Patient ID: Tracy Walker, female    DOB: 1928/02/14, 81 y.o.   MRN: [id number]  HPI 81 year old female who is here to follow-up chronic problems including atrial fib, old cerebral infarct. Seizures, hemiparesis, acute kidney failure unspecified. She really is doing well. She no longer has atrial fibrillation. She does have some sequelae related to the cerebral infarction which includes left-sided weakness. She was put on anticonvulsives prophylactically but has not had seizures and continues with Keppra in that regard. I believe the acute kidney failure was related to her being hospitalized and is no longer a problem.  Patient Active Problem List   Diagnosis Date Noted  . Rotator cuff tear 06/15/2015  . Vaginal candidiasis 02/10/2014  . Abnormal EEG 02/08/2014  . UTI (lower urinary tract infection) 02/04/2014  . Acute encephalopathy 02/04/2014  . Hypertensive urgency 02/04/2014  . Acute on chronic renal failure 02/04/2014  . A-fib 02/04/2014  . CVA (cerebral infarction) 02/04/2014  . Overactive bladder   . Fatigue 08/30/2013  . Thyroid disease   . DVT (deep venous thrombosis)   . DVT of lower extremity (deep venous thrombosis) 08/09/2013  . Venous stasis of lower extremity 08/02/2013  . GERD (gastroesophageal reflux disease) 04/01/2013  . HTN (hypertension) 04/01/2013   Outpatient Encounter Prescriptions as of 06/29/2015  Medication Sig  . amLODipine (NORVASC) 10 MG tablet Take 1 tablet (10 mg total) by mouth daily.  Marland Kitchen aspirin 81 MG tablet Take 81 mg by mouth daily.  . hydrALAZINE (APRESOLINE) 10 MG tablet Take 10 mg by mouth 3 (three) times daily.  . hydrochlorothiazide (HYDRODIURIL) 25 MG tablet Take 1 tablet (25 mg total) by mouth daily.  Marland Kitchen levETIRAcetam (KEPPRA) 250 MG tablet TAKE 1 TABLET (250 MG TOTAL) BY MOUTH 2 (TWO) TIMES DAILY.  . metoprolol (LOPRESSOR) 50 MG tablet TAKE 1 TABLET (50 MG TOTAL) BY MOUTH 2 (TWO) TIMES DAILY.  . naproxen sodium (ANAPROX) 220  MG tablet Take 440 mg by mouth daily.  Marland Kitchen tolterodine (DETROL LA) 4 MG 24 hr capsule TAKE 1 CAPSULE (4 MG TOTAL) BY MOUTH DAILY.  . [DISCONTINUED] hydrALAZINE (APRESOLINE) 10 MG tablet Take 1 tablet (10 mg total) by mouth 3 (three) times daily. PATIENT NEEDS TO CONTACT OFFICE FOR ADDITIONAL   No facility-administered encounter medications on file as of 06/29/2015.      Review of Systems  Constitutional: Negative.   Respiratory: Negative.   Cardiovascular: Negative.   Genitourinary: Positive for frequency.  Neurological: Negative.   Psychiatric/Behavioral: Negative.        Objective:   Physical Exam  Constitutional: She is oriented to person, place, and time. She appears well-developed and well-nourished.  Cardiovascular: Normal rate and regular rhythm.   Pulmonary/Chest: Effort normal and breath sounds normal.  Neurological: She is alert and oriented to person, place, and time. She displays normal reflexes. No cranial nerve deficit. Coordination normal.  Psychiatric: Her behavior is normal.          Assessment & Plan:  1. Essential hypertension She is on a multidrug regimen for her blood pressure including beta blocker or calcium channel blocker and diaphoretic. She is also on hydralazine which she has to take 3 times a day. There is no evidence of congestive heart failure and I think a drug like hydralazine which is have to be taken multiple times a day is difficult for most people. I will discontinue hydralazine and favor of losartan 100 mg. Daughter will monitor her blood pressure at home.     -  BMP8+EGFR  2. DVT of lower extremity (deep venous thrombosis), bilateral This problem is resolved. She is no longer on anticoagulation  3. Cerebral infarction due to thrombosis of left middle cerebral artery He has mild sequelae including left-sided weakness but I would really not classify her deficit as significant and she does not either  4. Overactive bladder Continues with  Detrol and wears depends.  Wardell Honour MD

## 2015-06-30 LAB — BMP8+EGFR
BUN / CREAT RATIO: 17 (ref 11–26)
BUN: 22 mg/dL (ref 8–27)
CHLORIDE: 104 mmol/L (ref 97–108)
CO2: 27 mmol/L (ref 18–29)
Calcium: 9.2 mg/dL (ref 8.7–10.3)
Creatinine, Ser: 1.26 mg/dL — ABNORMAL HIGH (ref 0.57–1.00)
GFR calc non Af Amer: 39 mL/min/{1.73_m2} — ABNORMAL LOW (ref >59)
GFR, EST AFRICAN AMERICAN: 45 mL/min/{1.73_m2} — AB (ref >59)
GLUCOSE: 83 mg/dL (ref 65–99)
POTASSIUM: 4.1 mmol/L (ref 3.5–5.2)
Sodium: 142 mmol/L (ref 134–144)

## 2015-07-02 ENCOUNTER — Other Ambulatory Visit: Payer: Self-pay | Admitting: Family Medicine

## 2015-07-04 ENCOUNTER — Other Ambulatory Visit: Payer: Self-pay | Admitting: Family Medicine

## 2015-07-04 MED ORDER — HYDROCHLOROTHIAZIDE 25 MG PO TABS: 25.0000 mg | ORAL_TABLET | Freq: Every day | ORAL | Status: DC

## 2015-07-04 MED ORDER — TOLTERODINE TARTRATE ER 4 MG PO CP24: ORAL_CAPSULE | ORAL | Status: DC

## 2015-07-04 MED ORDER — METOPROLOL TARTRATE 50 MG PO TABS: ORAL_TABLET | ORAL | Status: DC

## 2015-07-04 NOTE — Telephone Encounter (Signed)
Pt aware and meds sent in  

## 2015-07-06 ENCOUNTER — Telehealth: Payer: Self-pay | Admitting: Family Medicine

## 2015-07-13 ENCOUNTER — Other Ambulatory Visit: Payer: Self-pay | Admitting: *Deleted

## 2015-07-13 NOTE — Telephone Encounter (Signed)
New rx sent to [facility] with demos, pt aware

## 2015-08-07 ENCOUNTER — Ambulatory Visit: Payer: Commercial Managed Care - HMO

## 2015-09-14 ENCOUNTER — Ambulatory Visit (INDEPENDENT_AMBULATORY_CARE_PROVIDER_SITE_OTHER): Payer: Commercial Managed Care - HMO

## 2015-09-14 ENCOUNTER — Ambulatory Visit (INDEPENDENT_AMBULATORY_CARE_PROVIDER_SITE_OTHER): Payer: Commercial Managed Care - HMO | Admitting: *Deleted

## 2015-09-14 VITALS — BP 130/63 | HR 50

## 2015-09-14 DIAGNOSIS — Z23 Encounter for immunization: Secondary | ICD-10-CM

## 2015-09-14 DIAGNOSIS — I10 Essential (primary) hypertension: Secondary | ICD-10-CM

## 2015-09-14 DIAGNOSIS — M1711 Unilateral primary osteoarthritis, right knee: Secondary | ICD-10-CM | POA: Diagnosis not present

## 2015-09-14 NOTE — Progress Notes (Signed)
Patient is here today for a bp recheck. Patients BP 130/63 mmHg  Pulse 50

## 2015-09-14 NOTE — Patient Instructions (Signed)
Hypertension Hypertension, commonly called high blood pressure, is when the force of blood pumping through your arteries is too strong. Your arteries are the blood vessels that carry blood from your heart throughout your body. A blood pressure reading consists of a higher number over a lower number, such as 110/72. The higher number (systolic) is the pressure inside your arteries when your heart pumps. The lower number (diastolic) is the pressure inside your arteries when your heart relaxes. Ideally you want your blood pressure below 120/80. Hypertension forces your heart to work harder to pump blood. Your arteries may become narrow or stiff. Having untreated or uncontrolled hypertension can cause heart attack, stroke, kidney disease, and other problems. RISK FACTORS Some risk factors for high blood pressure are controllable. Others are not.  Risk factors you cannot control include:   Race. You may be at higher risk if you are African American.  Age. Risk increases with age.  Gender. Men are at higher risk than women before age 45 years. After age 65, women are at higher risk than men. Risk factors you can control include:  Not getting enough exercise or physical activity.  Being overweight.  Getting too much fat, sugar, calories, or salt in your diet.  Drinking too much alcohol. SIGNS AND SYMPTOMS Hypertension does not usually cause signs or symptoms. Extremely high blood pressure (hypertensive crisis) may cause headache, anxiety, shortness of breath, and nosebleed. DIAGNOSIS To check if you have hypertension, your health care provider will measure your blood pressure while you are seated, with your arm held at the level of your heart. It should be measured at least twice using the same arm. Certain conditions can cause a difference in blood pressure between your right and left arms. A blood pressure reading that is higher than normal on one occasion does not mean that you need treatment. If  it is not clear whether you have high blood pressure, you may be asked to return on a different day to have your blood pressure checked again. Or, you may be asked to monitor your blood pressure at home for 1 or more weeks. TREATMENT Treating high blood pressure includes making lifestyle changes and possibly taking medicine. Living a healthy lifestyle can help lower high blood pressure. You may need to change some of your habits. Lifestyle changes may include:  Following the DASH diet. This diet is high in fruits, vegetables, and whole grains. It is low in salt, red meat, and added sugars.  Keep your sodium intake below 2,300 mg per day.  Getting at least 30-45 minutes of aerobic exercise at least 4 times per week.  Losing weight if necessary.  Not smoking.  Limiting alcoholic beverages.  Learning ways to reduce stress. Your health care provider may prescribe medicine if lifestyle changes are not enough to get your blood pressure under control, and if one of the following is true:  You are 18-59 years of age and your systolic blood pressure is above 140.  You are 60 years of age or older, and your systolic blood pressure is above 150.  Your diastolic blood pressure is above 90.  You have diabetes, and your systolic blood pressure is over 140 or your diastolic blood pressure is over 90.  You have kidney disease and your blood pressure is above 140/90.  You have heart disease and your blood pressure is above 140/90. Your personal target blood pressure may vary depending on your medical conditions, your age, and other factors. HOME CARE INSTRUCTIONS    Have your blood pressure rechecked as directed by your health care provider.   Take medicines only as directed by your health care provider. Follow the directions carefully. Blood pressure medicines must be taken as prescribed. The medicine does not work as well when you skip doses. Skipping doses also puts you at risk for  problems.  Do not smoke.   Monitor your blood pressure at home as directed by your health care provider. SEEK MEDICAL CARE IF:   You think you are having a reaction to medicines taken.  You have recurrent headaches or feel dizzy.  You have swelling in your ankles.  You have trouble with your vision. SEEK IMMEDIATE MEDICAL CARE IF:  You develop a severe headache or confusion.  You have unusual weakness, numbness, or feel faint.  You have severe chest or abdominal pain.  You vomit repeatedly.  You have trouble breathing. MAKE SURE YOU:   Understand these instructions.  Will watch your condition.  Will get help right away if you are not doing well or get worse.   This information is not intended to replace advice given to you by your health care provider. Make sure you discuss any questions you have with your health care provider.   Document Released: 10/14/2005 Document Revised: 02/28/2015 Document Reviewed: 08/06/2013 Elsevier Interactive Patient Education 2016 [employer]  

## 2015-10-25 ENCOUNTER — Other Ambulatory Visit: Payer: Self-pay | Admitting: Family Medicine

## 2015-11-16 DIAGNOSIS — M25561 Pain in right knee: Secondary | ICD-10-CM | POA: Diagnosis not present

## 2015-11-16 DIAGNOSIS — G8929 Other chronic pain: Secondary | ICD-10-CM | POA: Diagnosis not present

## 2015-11-16 DIAGNOSIS — M1711 Unilateral primary osteoarthritis, right knee: Secondary | ICD-10-CM | POA: Diagnosis not present

## 2015-12-08 ENCOUNTER — Other Ambulatory Visit: Payer: Self-pay | Admitting: Family Medicine

## 2015-12-10 ENCOUNTER — Other Ambulatory Visit: Payer: Self-pay | Admitting: Family Medicine

## 2015-12-28 ENCOUNTER — Ambulatory Visit (INDEPENDENT_AMBULATORY_CARE_PROVIDER_SITE_OTHER): Payer: Commercial Managed Care - HMO | Admitting: Family Medicine

## 2015-12-28 ENCOUNTER — Encounter: Payer: Self-pay | Admitting: Family Medicine

## 2015-12-28 VITALS — BP 173/64 | HR 48 | Temp 96.0°F | Ht 65.0 in | Wt 146.0 lb

## 2015-12-28 DIAGNOSIS — K219 Gastro-esophageal reflux disease without esophagitis: Secondary | ICD-10-CM | POA: Diagnosis not present

## 2015-12-28 DIAGNOSIS — I10 Essential (primary) hypertension: Secondary | ICD-10-CM

## 2015-12-28 DIAGNOSIS — E079 Disorder of thyroid, unspecified: Secondary | ICD-10-CM

## 2015-12-28 MED ORDER — LOSARTAN POTASSIUM-HCTZ 50-12.5 MG PO TABS: 1.0000 | ORAL_TABLET | Freq: Every day | ORAL | Status: DC

## 2015-12-28 NOTE — Patient Instructions (Signed)
Medicare Annual Wellness Visit  [place] and the medical providers at [facility] strive to bring you the best medical care.  In doing so we not only want to address your current medical conditions and concerns but also to detect new conditions early and prevent illness, disease and health-related problems.    Medicare offers a yearly Wellness Visit which allows our clinical staff to assess your need for preventative services including immunizations, lifestyle education, counseling to decrease risk of preventable diseases and screening for fall risk and other medical concerns.    This visit is provided free of charge (no copay) for all Medicare recipients. The clinical pharmacists at [facility] have begun to conduct these Wellness Visits which will also include a thorough review of all your medications.    As you primary medical provider recommend that you make an appointment for your Annual Wellness Visit if you have not done so already this year.  You may set up this appointment before you leave today or you may call back ([phone]) and schedule an appointment.  Please make sure when you call that you mention that you are scheduling your Annual Wellness Visit with the clinical pharmacist so that the appointment may be made for the proper length of time.     Continue current medications. Continue good therapeutic lifestyle changes which include good diet and exercise. Fall precautions discussed with patient. If an FOBT was given today- please return it to our front desk. If you are over 50 years old - you may need Prevnar 13 or the adult Pneumonia vaccine.  **Flu shots are available--- please call and schedule a FLU-CLINIC appointment**  After your visit with us today you will receive a survey in the mail or online from [employer] regarding your care with us. Please take a moment to fill this out. Your feedback is very  important to us as you can help us better understand your patient needs as well as improve your experience and satisfaction. WE CARE ABOUT YOU!!!    

## 2015-12-28 NOTE — Progress Notes (Signed)
Subjective:    Patient ID: Tracy Walker, female    DOB: [birth year], 82 y.o.   MRN: [id number]  HPI Pt here for follow up and management of chronic medical problems which includes thyroid disease and hypertension. She is taking medications regularly. 82 year old female here to follow-up blood pressure. She would like to take less blood pressure medicines but given that her pressure today is 173/64 I'm not inclined to withdrawal any of her BP meds. I can combine losartan and hydrochlorothiazide and this might help somewhat. Reviewed her other medicines including anticonvulsant medicine for overactive bladder Generally she is doing well she is in a good place and has no other specific complaints today.     Patient Active Problem List   Diagnosis Date Noted  . Rotator cuff tear 06/15/2015  . Vaginal candidiasis 02/10/2014  . Abnormal EEG 02/08/2014  . UTI (lower urinary tract infection) 02/04/2014  . Acute encephalopathy 02/04/2014  . Hypertensive urgency 02/04/2014  . Acute on chronic renal failure ([place]) 02/04/2014  . A-fib ([place]) 02/04/2014  . CVA (cerebral infarction) 02/04/2014  . Overactive bladder   . Fatigue 08/30/2013  . Thyroid disease   . DVT (deep venous thrombosis) ([place])   . DVT of lower extremity (deep venous thrombosis) ([place]) 08/09/2013  . Venous stasis of lower extremity 08/02/2013  . GERD (gastroesophageal reflux disease) 04/01/2013  . HTN (hypertension) 04/01/2013   Outpatient Encounter Prescriptions as of 12/28/2015  Medication Sig  . amLODipine (NORVASC) 10 MG tablet TAKE 1 TABLET (10 MG TOTAL) BY MOUTH DAILY.  Marland Kitchen aspirin 81 MG tablet Take 81 mg by mouth daily.  . hydrochlorothiazide (HYDRODIURIL) 25 MG tablet Take 1 tablet (25 mg total) by mouth daily.  Marland Kitchen levETIRAcetam (KEPPRA) 250 MG tablet TAKE 1 TABLET (250 MG TOTAL) BY MOUTH 2 (TWO) TIMES DAILY.  Marland Kitchen losartan (COZAAR) 50 MG tablet Take 1 tablet (50 mg total) by mouth daily.  . metoprolol (LOPRESSOR) 50 MG  tablet TAKE 1 TABLET (50 MG TOTAL) BY MOUTH 2 (TWO) TIMES DAILY.  Marland Kitchen tolterodine (DETROL LA) 4 MG 24 hr capsule TAKE 1 CAPSULE (4 MG TOTAL) BY MOUTH DAILY.  . [DISCONTINUED] hydrALAZINE (APRESOLINE) 10 MG tablet Take 10 mg by mouth 3 (three) times daily.  . [DISCONTINUED] hydrochlorothiazide (HYDRODIURIL) 25 MG tablet TAKE 1 TABLET (25 MG TOTAL) BY MOUTH DAILY.  . [DISCONTINUED] naproxen sodium (ANAPROX) 220 MG tablet Take 440 mg by mouth daily.   No facility-administered encounter medications on file as of 12/28/2015.      Review of Systems  Constitutional: Negative.   HENT: Negative.   Eyes: Negative.   Respiratory: Negative.   Cardiovascular: Negative.   Gastrointestinal: Negative.   Endocrine: Negative.   Genitourinary: Negative.   Musculoskeletal: Negative.   Skin: Negative.   Allergic/Immunologic: Negative.   Neurological: Negative.   Hematological: Negative.   Psychiatric/Behavioral: Negative.        Objective:   Physical Exam  Constitutional: She is oriented to person, place, and time. She appears well-developed and well-nourished.  Cardiovascular: Normal rate and normal heart sounds.   Pulmonary/Chest: Effort normal and breath sounds normal.  Musculoskeletal: She exhibits no edema.  Neurological: She is alert and oriented to person, place, and time.  Psychiatric: She has a normal mood and affect. Her behavior is normal.   BP 173/64 mmHg  Pulse 48  Temp(Src) 96 F (35.6 C) (Oral)  Ht '5\' 5"'$  (1.651 m)  Wt 146 lb (66.225 kg)  BMI 24.30 kg/m2  Assessment & Plan:  1. Essential hypertension A pressures slightly elevated but will at age 39 I think it's okay - CMP14+EGFR - Lipid panel  2. Thyroid disease history of underactive thyroid - TSH  3. Gastroesophageal reflux disease, esophagitis presence not specified Symptomatic at this time - CMP14+EGFR  Wardell Honour MD

## 2015-12-29 LAB — CMP14+EGFR
A/G RATIO: 1.5 (ref 1.1–2.5)
ALT: 11 IU/L (ref 0–32)
AST: 14 IU/L (ref 0–40)
Albumin: 4 g/dL (ref 3.5–4.7)
Alkaline Phosphatase: 41 IU/L (ref 39–117)
BILIRUBIN TOTAL: 0.4 mg/dL (ref 0.0–1.2)
BUN/Creatinine Ratio: 20 (ref 11–26)
BUN: 28 mg/dL — ABNORMAL HIGH (ref 8–27)
CALCIUM: 9.1 mg/dL (ref 8.7–10.3)
CHLORIDE: 104 mmol/L (ref 96–106)
CO2: 24 mmol/L (ref 18–29)
Creatinine, Ser: 1.43 mg/dL — ABNORMAL HIGH (ref 0.57–1.00)
GFR, EST AFRICAN AMERICAN: 38 mL/min/{1.73_m2} — AB (ref >59)
GFR, EST NON AFRICAN AMERICAN: 33 mL/min/{1.73_m2} — AB (ref >59)
GLOBULIN, TOTAL: 2.7 g/dL (ref 1.5–4.5)
Glucose: 87 mg/dL (ref 65–99)
POTASSIUM: 4.1 mmol/L (ref 3.5–5.2)
SODIUM: 143 mmol/L (ref 134–144)
Total Protein: 6.7 g/dL (ref 6.0–8.5)

## 2015-12-29 LAB — LIPID PANEL
CHOL/HDL RATIO: 5.8 ratio — AB (ref 0.0–4.4)
Cholesterol, Total: 210 mg/dL — ABNORMAL HIGH (ref 100–199)
HDL: 36 mg/dL — AB (ref >39)
LDL Calculated: 133 mg/dL — ABNORMAL HIGH (ref 0–99)
TRIGLYCERIDES: 205 mg/dL — AB (ref 0–149)
VLDL Cholesterol Cal: 41 mg/dL — ABNORMAL HIGH (ref 5–40)

## 2015-12-29 LAB — TSH: TSH: 6.99 u[IU]/mL — AB (ref 0.450–4.500)

## 2016-01-01 ENCOUNTER — Telehealth: Payer: Self-pay | Admitting: Family Medicine

## 2016-01-01 MED ORDER — LOSARTAN POTASSIUM-HCTZ 50-12.5 MG PO TABS: 1.0000 | ORAL_TABLET | Freq: Every day | ORAL | Status: DC

## 2016-01-01 NOTE — Telephone Encounter (Signed)
done

## 2016-01-12 ENCOUNTER — Telehealth: Payer: Self-pay | Admitting: Family Medicine

## 2016-01-12 MED ORDER — OXYBUTYNIN CHLORIDE 5 MG PO TABS: 5.0000 mg | ORAL_TABLET | Freq: Three times a day (TID) | ORAL | Status: DC

## 2016-01-12 NOTE — Telephone Encounter (Signed)
Family member aware.   

## 2016-01-12 NOTE — Telephone Encounter (Signed)
Can we switch meds

## 2016-01-12 NOTE — Telephone Encounter (Signed)
Okay to switch to oxybutynin 5 mg 3 times a day

## 2016-01-16 ENCOUNTER — Telehealth: Payer: Self-pay

## 2016-01-16 NOTE — Telephone Encounter (Signed)
I am getting a prior auth for Oxybutynin  Is patient supposed to be on Oxybutynin and Detrol??

## 2016-01-16 NOTE — Telephone Encounter (Signed)
She is supposed to be on one or the other. My preference would be Detrol if insurance will pay for it ; ifnot, oxybutynin.

## 2016-01-17 ENCOUNTER — Telehealth: Payer: Self-pay | Admitting: Family Medicine

## 2016-01-17 MED ORDER — OXYBUTYNIN CHLORIDE 5 MG PO TABS: 5.0000 mg | ORAL_TABLET | Freq: Three times a day (TID) | ORAL | Status: DC

## 2016-01-17 NOTE — Telephone Encounter (Signed)
[facility] for [facility].

## 2016-01-21 ENCOUNTER — Other Ambulatory Visit: Payer: Self-pay | Admitting: Family Medicine

## 2016-01-30 ENCOUNTER — Telehealth: Payer: Self-pay | Admitting: Family Medicine

## 2016-02-02 NOTE — Telephone Encounter (Signed)
Sent updated med list by fax

## 2016-02-05 ENCOUNTER — Other Ambulatory Visit: Payer: Self-pay | Admitting: *Deleted

## 2016-02-05 MED ORDER — LOSARTAN POTASSIUM-HCTZ 50-12.5 MG PO TABS: 1.0000 | ORAL_TABLET | Freq: Every day | ORAL | Status: AC

## 2016-02-05 MED ORDER — METOPROLOL TARTRATE 50 MG PO TABS: ORAL_TABLET | ORAL | Status: AC

## 2016-02-05 MED ORDER — LEVETIRACETAM 250 MG PO TABS: ORAL_TABLET | ORAL | Status: AC

## 2016-02-05 MED ORDER — OXYBUTYNIN CHLORIDE 5 MG PO TABS: 5.0000 mg | ORAL_TABLET | Freq: Three times a day (TID) | ORAL | Status: AC

## 2016-02-05 MED ORDER — AMLODIPINE BESYLATE 10 MG PO TABS: ORAL_TABLET | ORAL | Status: AC

## 2016-02-05 MED ORDER — ASPIRIN 81 MG PO TABS: 81.0000 mg | ORAL_TABLET | Freq: Every day | ORAL | Status: AC

## 2016-03-21 ENCOUNTER — Ambulatory Visit (INDEPENDENT_AMBULATORY_CARE_PROVIDER_SITE_OTHER): Payer: Commercial Managed Care - HMO | Admitting: Family Medicine

## 2016-03-21 ENCOUNTER — Encounter: Payer: Self-pay | Admitting: Family Medicine

## 2016-03-21 VITALS — BP 175/74 | HR 49 | Temp 97.3°F | Ht 65.0 in | Wt 143.6 lb

## 2016-03-21 DIAGNOSIS — M25561 Pain in right knee: Secondary | ICD-10-CM | POA: Diagnosis not present

## 2016-03-21 NOTE — Progress Notes (Signed)
   Subjective:    Patient ID: Tracy Walker, female    DOB: 06/01/1928, 82 y.o.   MRN: [id number]  HPI 82 year old female with pain in her right knee. This is chronic and ongoing. She has been told she has fairly severe arthritis and has received injections previously. Last injection by history was by Dr. Tinnie GensJeffrey about 5 months ago. She denies any swelling. She ambulates with a walker.  Patient Active Problem List   Diagnosis Date Noted  . Rotator cuff tear 06/15/2015  . Vaginal candidiasis 02/10/2014  . Abnormal EEG 02/08/2014  . UTI (lower urinary tract infection) 02/04/2014  . Acute encephalopathy 02/04/2014  . Hypertensive urgency 02/04/2014  . Acute on chronic renal failure ([facility]) 02/04/2014  . A-fib ([facility]) 02/04/2014  . CVA (cerebral infarction) 02/04/2014  . Overactive bladder   . Fatigue 08/30/2013  . Thyroid disease   . DVT (deep venous thrombosis) ([facility])   . DVT of lower extremity (deep venous thrombosis) ([facility]) 08/09/2013  . Venous stasis of lower extremity 08/02/2013  . GERD (gastroesophageal reflux disease) 04/01/2013  . HTN (hypertension) 04/01/2013   Outpatient Encounter Prescriptions as of 03/21/2016  Medication Sig  . amLODipine (NORVASC) 10 MG tablet TAKE 1 TABLET (10 MG TOTAL) BY MOUTH DAILY.  Marland Kitchen. aspirin 81 MG tablet Take 1 tablet (81 mg total) by mouth daily. (Patient taking differently: Take 81 mg by mouth 2 (two) times a week. )  . levETIRAcetam (KEPPRA) 250 MG tablet TAKE 1 TABLET (250 MG TOTAL) BY MOUTH 2 (TWO) TIMES DAILY.  Marland Kitchen. losartan-hydrochlorothiazide (HYZAAR) 50-12.5 MG tablet Take 1 tablet by mouth daily.  . metoprolol (LOPRESSOR) 50 MG tablet TAKE 1 TABLET (50 MG TOTAL) BY MOUTH 2 (TWO) TIMES DAILY.  Marland Kitchen. oxybutynin (DITROPAN) 5 MG tablet Take 1 tablet (5 mg total) by mouth 3 (three) times daily.   No facility-administered encounter medications on file as of 03/21/2016.      Review of Systems  Constitutional: Negative.   Cardiovascular: Negative.     Gastrointestinal: Negative.   Genitourinary: Negative.   Musculoskeletal: Positive for arthralgias.  Psychiatric/Behavioral: Negative.        Objective:   Physical Exam  Constitutional: She appears well-developed and well-nourished.  Musculoskeletal:  Right knee: No effusion is present joint space was palpated and a lateral approach was chosen. Skin was prepped with alcohol and 1 mL of Depo-Medrol and Marcaine was injected into the joint space. Patient tolerated this well.   BP 175/74 mmHg  Pulse 49  Temp(Src) 97.3 F (36.3 C) (Oral)  Ht 5\' 5"  (1.651 m)  Wt 143 lb 9.6 oz (65.137 kg)  BMI 23.90 kg/m2        Assessment & Plan:  1. Right knee pain Knee pain related to degenerative arthritis. Joint was injected with Depo-Medrol which she tolerated well and will hopefully gain a few months relief.  Frederica KusterStephen M Miller MD

## 2016-04-18 ENCOUNTER — Other Ambulatory Visit: Payer: Self-pay | Admitting: Family Medicine

## 2016-05-18 ENCOUNTER — Emergency Department (HOSPITAL_COMMUNITY): Payer: Commercial Managed Care - HMO

## 2016-05-18 ENCOUNTER — Encounter (HOSPITAL_COMMUNITY): Payer: Self-pay | Admitting: Emergency Medicine

## 2016-05-18 ENCOUNTER — Emergency Department (HOSPITAL_COMMUNITY)
Admission: EM | Admit: 2016-05-18 | Discharge: 2016-05-18 | Disposition: A | Discharge status: Home / Self Care | Payer: Commercial Managed Care - HMO | Attending: Emergency Medicine | Admitting: Emergency Medicine

## 2016-05-18 DIAGNOSIS — S0990XA Unspecified injury of head, initial encounter: Secondary | ICD-10-CM | POA: Diagnosis not present

## 2016-05-18 DIAGNOSIS — Y999 Unspecified external cause status: Secondary | ICD-10-CM | POA: Insufficient documentation

## 2016-05-18 DIAGNOSIS — R279 Unspecified lack of coordination: Secondary | ICD-10-CM | POA: Diagnosis not present

## 2016-05-18 DIAGNOSIS — N189 Chronic kidney disease, unspecified: Secondary | ICD-10-CM | POA: Insufficient documentation

## 2016-05-18 DIAGNOSIS — Z7982 Long term (current) use of aspirin: Secondary | ICD-10-CM | POA: Insufficient documentation

## 2016-05-18 DIAGNOSIS — I129 Hypertensive chronic kidney disease with stage 1 through stage 4 chronic kidney disease, or unspecified chronic kidney disease: Secondary | ICD-10-CM | POA: Insufficient documentation

## 2016-05-18 DIAGNOSIS — S42302A Unspecified fracture of shaft of humerus, left arm, initial encounter for closed fracture: Secondary | ICD-10-CM

## 2016-05-18 DIAGNOSIS — Z743 Need for continuous supervision: Secondary | ICD-10-CM | POA: Diagnosis not present

## 2016-05-18 DIAGNOSIS — S42292A Other displaced fracture of upper end of left humerus, initial encounter for closed fracture: Secondary | ICD-10-CM | POA: Diagnosis not present

## 2016-05-18 DIAGNOSIS — Z8673 Personal history of transient ischemic attack (TIA), and cerebral infarction without residual deficits: Secondary | ICD-10-CM | POA: Diagnosis not present

## 2016-05-18 DIAGNOSIS — Z87891 Personal history of nicotine dependence: Secondary | ICD-10-CM | POA: Diagnosis not present

## 2016-05-18 DIAGNOSIS — S42202A Unspecified fracture of upper end of left humerus, initial encounter for closed fracture: Secondary | ICD-10-CM | POA: Diagnosis not present

## 2016-05-18 DIAGNOSIS — W06XXXA Fall from bed, initial encounter: Secondary | ICD-10-CM | POA: Diagnosis not present

## 2016-05-18 DIAGNOSIS — Y929 Unspecified place or not applicable: Secondary | ICD-10-CM | POA: Diagnosis not present

## 2016-05-18 DIAGNOSIS — Y939 Activity, unspecified: Secondary | ICD-10-CM | POA: Insufficient documentation

## 2016-05-18 DIAGNOSIS — M25512 Pain in left shoulder: Secondary | ICD-10-CM | POA: Diagnosis not present

## 2016-05-18 DIAGNOSIS — S199XXA Unspecified injury of neck, initial encounter: Secondary | ICD-10-CM | POA: Diagnosis not present

## 2016-05-18 MED ORDER — FENTANYL CITRATE (PF) 100 MCG/2ML IJ SOLN
50.0000 ug | Freq: Once | INTRAMUSCULAR | Status: AC
  Administered 2016-05-18: 50 ug via INTRAVENOUS
  Filled 2016-05-18: qty 2

## 2016-05-18 MED ORDER — HYDROCODONE-ACETAMINOPHEN 5-325 MG PO TABS: ORAL_TABLET | ORAL | Status: AC

## 2016-05-18 MED ORDER — HYDROCODONE-ACETAMINOPHEN 5-325 MG PO TABS
1.0000 | ORAL_TABLET | Freq: Once | ORAL | Status: AC
  Administered 2016-05-18: 1 via ORAL
  Filled 2016-05-18: qty 1

## 2016-05-18 NOTE — ED Notes (Signed)
Called [facility] for transport home. Dispatcher states, "It may be awhile"

## 2016-05-18 NOTE — ED Notes (Signed)
Patient with no complaints at this time. Respirations even and unlabored. Skin warm/dry. Discharge instructions reviewed with patient and family at this time. Patient/family given opportunity to voice concerns/ask questions. IV removed per policy and band-aid applied to site. Patient discharged at this time and left Emergency Department via EMS for transport home. Family had possession of Rx and discharge paperwork upon leaving department.

## 2016-05-18 NOTE — ED Notes (Signed)
Case Management to see patient

## 2016-05-18 NOTE — Care Management (Signed)
Spoke with SW and information being reviewed by [facility] for authorization for a SNF.

## 2016-05-18 NOTE — ED Notes (Signed)
Patient provided lunch tray.

## 2016-05-18 NOTE — ED Provider Notes (Signed)
CSN: [id number]     Arrival date & time 05/18/16  0800 History   First MD Initiated Contact with Patient 05/18/16 [phone]     Chief Complaint  Patient presents with  . Fall     (Consider location/radiation/quality/duration/timing/severity/associated sxs/prior Treatment) HPI   Tracy Walker is a 82 y.o. female with hx of HTN, previous DVT, CKD who presents to the Emergency Department from home complaining of left shoulder pain after accidentally falling out of bed.  She states she landed on her left arm and now has pain with attempted movement.  Also reports abrasions to her right elbow, left hand.  She denies head injury, neck or back pain and LOC numbness or weakness of the extremities.      Past Medical History  Diagnosis Date  . GERD (gastroesophageal reflux disease)   . Hypertension   . Thyroid disease     subclinical hypothyroidism  . DVT (deep venous thrombosis) ([facility])     IVC filter  . Overactive bladder   . CKD (chronic kidney disease)   . CVA (cerebral infarction)   . Arthritis    Past Surgical History  Procedure Laterality Date  . Total hip arthroplasty      right  . Total knee arthroplasty      Left  . Abdominal hysterectomy    . Ivc filter     Family History  Problem Relation Age of Onset  . Pneumonia Mother 44   Social History  Substance Use Topics  . Smoking status: Former Games developer  . Smokeless tobacco: None  . Alcohol Use: No   OB History    No data available     Review of Systems  Constitutional: Negative for fever and appetite change.  Respiratory: Negative for chest tightness and shortness of breath.   Cardiovascular: Negative for chest pain.  Gastrointestinal: Negative for nausea, vomiting and abdominal pain.  Musculoskeletal: Positive for arthralgias (left shoulder pain and swelling). Negative for back pain and neck pain.  Skin:       Abrasions to the right elbow, left hand  Neurological: Negative for dizziness, syncope, numbness and  headaches.  Psychiatric/Behavioral: Negative for confusion.  All other systems reviewed and are negative.     Allergies  Review of patient's allergies indicates no known allergies.  Home Medications   Prior to Admission medications   Medication Sig Start Date End Date Taking? Authorizing Provider  amLODipine (NORVASC) 10 MG tablet TAKE 1 TABLET (10 MG TOTAL) BY MOUTH DAILY. 02/05/16   Frederica Kuster, MD  aspirin 81 MG tablet Take 1 tablet (81 mg total) by mouth daily. Patient taking differently: Take 81 mg by mouth 2 (two) times a week.  02/05/16   Frederica Kuster, MD  levETIRAcetam (KEPPRA) 250 MG tablet TAKE 1 TABLET (250 MG TOTAL) BY MOUTH 2 (TWO) TIMES DAILY. 02/05/16   Frederica Kuster, MD  levETIRAcetam (KEPPRA) 250 MG tablet TAKE 1 TABLET (250 MG TOTAL) BY MOUTH 2 (TWO) TIMES DAILY. 04/18/16   Frederica Kuster, MD  losartan-hydrochlorothiazide (HYZAAR) 50-12.5 MG tablet Take 1 tablet by mouth daily. 02/05/16   Frederica Kuster, MD  metoprolol (LOPRESSOR) 50 MG tablet TAKE 1 TABLET (50 MG TOTAL) BY MOUTH 2 (TWO) TIMES DAILY. 02/05/16   Frederica Kuster, MD  oxybutynin (DITROPAN) 5 MG tablet Take 1 tablet (5 mg total) by mouth 3 (three) times daily. 02/05/16   Frederica Kuster, MD   BP 172/57 mmHg  Pulse 64  Temp(Src)  98.1 F (36.7 C) (Oral)  Ht 5\' 5"  (1.651 m)  Wt 65.772 kg  BMI 24.13 kg/m2  SpO2 100% Physical Exam  Constitutional: She is oriented to person, place, and time. She appears well-nourished.  HENT:  Head: Atraumatic.  Mouth/Throat: Uvula is midline and oropharynx is clear and moist. Mucous membranes are dry.  Eyes: Conjunctivae and EOM are normal. Pupils are equal, round, and reactive to light.  Neck: Normal range of motion. Neck supple.  Cardiovascular: Normal rate, regular rhythm and intact distal pulses.   Pulmonary/Chest: Effort normal and breath sounds normal. No respiratory distress. She exhibits no tenderness.  Abdominal: Soft. She exhibits no distension.  There is no tenderness. There is no rebound and no guarding.  Musculoskeletal: She exhibits edema and tenderness.  Tenderness with anterior deformity of the left shoulder. Left elbow and wrist are NT.  No spinal tenderness, pelvis NT  Neurological: She is alert and oriented to person, place, and time. Coordination normal.  Skin: Skin is warm and dry.  Superficial abrasions of the right elbow and left hand.  No edema , bleeding controlled.  Psychiatric: She has a normal mood and affect. Thought content normal.  Nursing note and vitals reviewed.   ED Course  Procedures (including critical care time) Labs Review Labs Reviewed - No data to display  Imaging Review Ct Head Wo Contrast  05/18/2016  CLINICAL DATA:  Fall. Mental status changes. History encephalopathy. EXAM: CT HEAD WITHOUT CONTRAST CT CERVICAL SPINE WITHOUT CONTRAST TECHNIQUE: Multidetector CT imaging of the head and cervical spine was performed following the standard protocol without intravenous contrast. Multiplanar CT image reconstructions of the cervical spine were also generated. COMPARISON:  02/04/2014 brain MR. Head and cervical spine CTs of 02/04/2014. FINDINGS: CT HEAD FINDINGS Sinuses/Soft tissues: No significant soft tissue swelling. Clear paranasal sinuses and mastoid air cells. Intracranial: Cerebral atrophy, expected for age. Mild to moderate low density in the periventricular white matter likely related to small vessel disease. Remote left basal ganglia lacunar infarct. No mass lesion, hemorrhage, hydrocephalus, acute infarct, intra-axial, or extra-axial fluid collection. CT CERVICAL SPINE FINDINGS Spinal visualization through the bottom of T2. Prevertebral soft tissues are within normal limits. Bilateral carotid atherosclerosis. No apical pneumothorax. multilevel cervical spondylosis. This results in areas of central canal and bilateral neural foraminal narrowing. Most significant at C5-6 and C6-7. Skull base intact.  Straightening of expected cervical lordosis. Trace C7-T1 anterolisthesis is chronic and likely degenerative. Facets are well-aligned. Coronal reformats demonstrate a normal C1-C2 articulation. IMPRESSION: 1. No acute intracranial abnormality. Cerebral atrophy and small vessel ischemic change. 2. Cervical spondylosis, without acute fracture or subluxation. Electronically Signed   By: Jeronimo Greaves M.D.   On: 05/18/2016 09:45   Ct Cervical Spine Wo Contrast  05/18/2016  CLINICAL DATA:  Fall. Mental status changes. History encephalopathy. EXAM: CT HEAD WITHOUT CONTRAST CT CERVICAL SPINE WITHOUT CONTRAST TECHNIQUE: Multidetector CT imaging of the head and cervical spine was performed following the standard protocol without intravenous contrast. Multiplanar CT image reconstructions of the cervical spine were also generated. COMPARISON:  02/04/2014 brain MR. Head and cervical spine CTs of 02/04/2014. FINDINGS: CT HEAD FINDINGS Sinuses/Soft tissues: No significant soft tissue swelling. Clear paranasal sinuses and mastoid air cells. Intracranial: Cerebral atrophy, expected for age. Mild to moderate low density in the periventricular white matter likely related to small vessel disease. Remote left basal ganglia lacunar infarct. No mass lesion, hemorrhage, hydrocephalus, acute infarct, intra-axial, or extra-axial fluid collection. CT CERVICAL SPINE FINDINGS Spinal visualization through the  bottom of T2. Prevertebral soft tissues are within normal limits. Bilateral carotid atherosclerosis. No apical pneumothorax. multilevel cervical spondylosis. This results in areas of central canal and bilateral neural foraminal narrowing. Most significant at C5-6 and C6-7. Skull base intact. Straightening of expected cervical lordosis. Trace C7-T1 anterolisthesis is chronic and likely degenerative. Facets are well-aligned. Coronal reformats demonstrate a normal C1-C2 articulation. IMPRESSION: 1. No acute intracranial abnormality. Cerebral  atrophy and small vessel ischemic change. 2. Cervical spondylosis, without acute fracture or subluxation. Electronically Signed   By: Jeronimo Greaves M.D.   On: 05/18/2016 09:45   Dg Shoulder Left  05/18/2016  CLINICAL DATA:  Fall, left shoulder pain. EXAM: LEFT SHOULDER - 2+ VIEW COMPARISON:  None. FINDINGS: Slightly displaced fracture of the left humeral neck, with probable mild impaction at the fracture site. Left humeral head remains well positioned relative to the glenoid fossa. Overlying acromioclavicular joint space is normally aligned. Adjacent soft tissues are unremarkable. IMPRESSION: Slightly displaced humeral neck fracture, with probable impaction at the fracture site. Electronically Signed   By: Bary Richard M.D.   On: 05/18/2016 08:58   I have personally reviewed and evaluated these images and lab results as part of my medical decision-making.   EKG Interpretation None      MDM   Final diagnoses:  Humerus fracture, left, closed, initial encounter   Pt seen by Dr. Verdie Mosher and care plan discussed.   Sling applied, pt resting comfortably.   Family now at bedside and states that they can not care for her at home.  Case management consulted and will eval.    PT ordered and consulted Dr. Rhona Leavens.  Pt does not meet admission criteria.    [id number]  Physical therapist in room to evaluate pt  Social worker also contacted and waiting for insurance approval for placement. Pt informed, family agree to discharge home. Home health ordered as well as bedside commode.    Arranged for EMS to transport home   Pauline Aus, [place] 05/18/16 1700   Lavera Guise, MD 05/19/16 [phone]

## 2016-05-18 NOTE — Progress Notes (Deleted)
CM spoke with family regarding care for patient.  Patient needs to be placed in a facility for rehab due to fall and injury.  DME equipment was offered for patient but family did not see the need for a wheelchair at this time.  CM contacted social worker regarding situation and advised for a PT consult for patient.  CM communicated to family and patient that if patient is not admitted they will have to follow up with PCP for placement into a facility. Also informed if she is admitted as an observation patient the admission to a facility would be paid out of pocket.  Family was able to understand situation.  CM to contact PT for consult.

## 2016-05-18 NOTE — Discharge Instructions (Signed)
Humerus Fracture Treated With Immobilization °The humerus is the large bone in your upper arm. You have a broken (fractured) humerus. These fractures are easily diagnosed with X-rays. °TREATMENT  °Simple fractures which will heal without disability are treated with simple immobilization. Immobilization means you will wear a cast, splint, or sling. You have a fracture which will do well with immobilization. The fracture will heal well simply by being held in a good position until it is stable enough to begin range of motion exercises. Do not take part in activities which would further injure your arm.  °HOME CARE INSTRUCTIONS  °· Put ice on the injured area. °¨ Put ice in a plastic bag. °¨ Place a towel between your skin and the bag. °¨ Leave the ice on for 15-20 minutes, 03-04 times a day. °· If you have a cast: °¨ Do not scratch the skin under the cast using sharp or pointed objects. °¨ Check the skin around the cast every day. You may put lotion on any red or sore areas. °¨ Keep your cast dry and clean. °· If you have a splint: °¨ Wear the splint as directed. °¨ Keep your splint dry and clean. °¨ You may loosen the elastic around the splint if your fingers become numb, tingle, or turn cold or blue. °· If you have a sling: °¨ Wear the sling as directed. °· Do not put pressure on any part of your cast or splint until it is fully hardened. °· Your cast or splint can be protected during bathing with a plastic bag. Do not lower the cast or splint into water. °· Only take over-the-counter or prescription medicines for pain, discomfort, or fever as directed by your caregiver. °· Do range of motion exercises as instructed by your caregiver. °· Follow up as directed by your caregiver. This is very important in order to avoid permanent injury or disability and chronic pain. °SEEK IMMEDIATE MEDICAL CARE IF:  °· Your skin or nails in the injured arm turn blue or gray. °· Your arm feels cold or numb. °· You develop severe pain  in the injured arm. °· You are having problems with the medicines you were given. °MAKE SURE YOU:  °· Understand these instructions. °· Will watch your condition. °· Will get help right away if you are not doing well or get worse. °  °This information is not intended to replace advice given to you by your health care provider. Make sure you discuss any questions you have with your health care provider. °  °Document Released: 01/20/2001 Document Revised: 11/04/2014 Document Reviewed: 03/08/2015 °Elsevier Interactive Patient Education ©2016 [employer] ° °

## 2016-05-18 NOTE — Evaluation (Addendum)
   05/18/16 1200  PT Visit Information  Last PT Received On 05/18/16  Assistance Needed +1  History of Present Illness PT is 82  yo, fell at home injuring Lt shoulder.  X-ray demonstrate a slight displaced humeral neck fx.    Precautions  Precautions Shoulder;Fall  Type of Shoulder Precautions (non weight bearing )  Required Braces or Orthoses Sling  Restrictions  Weight Bearing Restrictions Yes  LUE Weight Bearing NWB  Home Living  Family/patient expects to be discharged to: Skilled nursing facility  Prior Function  Level of Independence Independent with assistive device(s)  Comments household walker with rolling walker  Communication  Communication No difficulties  Pain Assessment  Pain Assessment 0-10  Pain Score 7  Pain Location Lt shoulder   Pain Descriptors / Indicators Aching  Pain Intervention(s) Limited activity within patient's tolerance  Cognition  Arousal/Alertness Awake/alert  Lower Extremity Assessment  Lower Extremity Assessment RLE deficits/detail;LLE deficits/detail  RLE Unable to fully assess due to immobilization (assume hip mm 2+ to 3-/5; knee 3+/5)  LLE Unable to fully assess due to pain;Unable to fully assess due to immobilization  Bed Mobility  Overal bed mobility Needs Assistance  Bed Mobility Supine to Sit;Sit to Supine  Supine to sit Max assist  Sit to supine Max assist  Transfers  Overall transfer level Needs assistance  Transfers Sit to/from Stand  Sit to Stand Mod assist  General transfer comment x3 for strengthening and improved confidence   PT - End of Session  Equipment Utilized During Treatment Gait belt  Activity Tolerance Patient limited by fatigue;Patient limited by pain  Patient left in bed;with call bell/phone within reach;with family/visitor present  PT Assessment  PT Therapy Diagnosis  Difficulty walking;Generalized weakness;Acute pain  PT Recommendation/Assessment Patient needs continued PT services  PT Problem List Decreased  strength;Decreased range of motion;Decreased activity tolerance;Decreased balance;Decreased mobility;Decreased coordination;Decreased knowledge of use of DME;Decreased safety awareness;Pain  Barriers to Discharge Inaccessible home environment;Decreased caregiver support  PT Plan  PT Frequency (ACUTE ONLY) Min 5X/week  PT Treatment/Interventions (ACUTE ONLY) Gait training;Stair training;Functional mobility training;Therapeutic activities;Therapeutic exercise;Balance training;Neuromuscular re-education;Patient/family education  PT Recommendation  Recommendations for Other Services OT consult  PT equipment (hemiwalker)  Individuals Consulted  Consulted and Agree with Results and Recommendations Patient;Family member/caregiver  Family Member Consulted daughter   Acute Rehab PT Goals  Patient Stated Goal To be able to walk again  PT Goal Formulation With patient  Time For Goal Achievement 05/22/16  Potential to Achieve Goals Good  PT Time Calculation  PT Start Time (ACUTE ONLY) 1230  PT Stop Time (ACUTE ONLY) 1335  PT Time Calculation (min) (ACUTE ONLY) 65 min  PT G-Codes **NOT FOR INPATIENT CLASS**  Functional Limitation Mobility: Walking and moving around  Mobility: Walking and Moving Around Current Status (A9191) CN  Mobility: Walking and Moving Around Goal Status (Y6060) CN  Mobility: Walking and Moving Around Discharge Status (O4599) CN  PT General Charges  $$ ACUTE PT VISIT 1 Procedure  PT Evaluation  $PT Eval Moderate Complexity 1 Procedure  PT Treatments  $Therapeutic Activity 8-22 mins   Pt is an 82 yo female who fell at home causing a fracture of her Lt shoulder.  Ms. Perotti need maximum assistance for bed mobility and is unable to transfer at this time.  I would recommend skilled nursing facility placement at this time for the patient's safety until she is more independent in mobility.    Virgina Organ, PT CLT [phone]

## 2016-05-18 NOTE — ED Notes (Signed)
Skin tear to right elbow cleansed and dressed.

## 2016-05-18 NOTE — Care Management (Signed)
CM contacted SW left message on VM that PT eval completed.

## 2016-05-18 NOTE — ED Provider Notes (Signed)
Medical screening examination/treatment/procedure(s) were conducted as a shared visit with non-physician practitioner(s) and myself.  I personally evaluated the patient during the encounter.   EKG Interpretation None      82 year old female, on aspirin, with history of HTN, DVT w/ IVC filter, CKD and prior CVA who presents with left shoulder pain after fall. Poor historian but states she slipped and fell today on left shoulder. Hit head but no LOC. C/o left shoulder pain. No numbness, weakness. Denies headache, n/v, chest pain, sob, neck or back pain, hip or pelvis pain, or abdominal pain. W/ deformity at the left shoulder. Neurovascularly in tact LUE. No other injuries noted. Will obtain X-ray of left shoulder, CT head and cervical spine.  Xr with humeral head fracture. CT head and neck w/o acute injury. Family concerned as feel they are unable to care for her at home. Normally with walker, and will be having difficulty ambulating in trailer home. Initially discussed with case management, who arranged PT evaluation in ED for potential placement into rehab facility. Discussed with hospitalist service who did not feel she meet inpt criteria. Potentially will be placed in rehab vs home health.  Lavera Guise, MD 05/18/16 [phone]

## 2016-05-18 NOTE — Care Management Note (Signed)
Case Management Note  Patient Details  Name: Tracy Walker MRN: [id number] Date of Birth: 1928-08-18  Subjective/Objective:      CM spoke with family and patient regarding care. Patient needs to be placed in a facility for rehab due to fall and injury.PT evaluation and FL2completed.                Action/Plan: Pending response from [facility] for authorization for admit to SNF.  CM arranged HH services and DME for patient.   Expected Discharge Date: May 18, 2016                 Expected Discharge Plan:  Home w Home Health Services  In-House Referral:  Clinical Social Work  Discharge planning Services  CM Consult  Post Acute Care Choice:  Home Health Choice offered to:  Adult Children, Patient  DME Arranged:  Bedside commode, Lightweight manual wheelchair with seat cushion (pending if patient needs) DME Agency:  [facility]  HH Arranged:  RN, PT, Social Work [employer] Agency:  [facility]  Status of Service:  In process, will continue to follow  If discussed at Long Length of Stay Meetings, dates discussed:    Additional Comments:  Fuller Plan, RN 05/18/2016, 5:12 PM

## 2016-05-18 NOTE — ED Notes (Signed)
PT in with patient for eval.

## 2016-05-18 NOTE — Care Management (Signed)
CM spoke with family regarding care for patient. Patient needs to be placed in a facility for rehab due to fall and injury. DME equipment was offered for patient but family did not see the need for a wheelchair at this time. CM contacted social worker regarding situation and advised for a PT consult for patient. CM communicated to family and patient that if patient is not admitted they will have to follow up with PCP for placement into a facility. Also informed if she is admitted as an observation patient the admission to a facility would be paid out of pocket. Family was able to understand situation. CM contacted PT for consult.

## 2016-05-18 NOTE — ED Notes (Signed)
Patient fell at home out of bed. Left shoulder pain. + anterior Deformity. Bruising noted. Alert/oriented. Incontinent of stool. Cleansed and linens changed on arrival.

## 2016-05-18 NOTE — Progress Notes (Signed)
[employer] case worker Toni Amend) stated that she will continue to try to get authorization from their Wellsite geologist. She stated that patient may admit to a SNF from home if authorization is given. She will contact family once decision is reached. [facility] is able to admit patient from home if Berkley Harvey is given.  CSW signing off.   Osborne Casco Myeshia Fojtik LCSWA [phone]

## 2016-05-18 NOTE — ED Notes (Signed)
Attempted to call son at phone number listed in emergency contact. Generic answering machine reached. No message left due to lack of identification.

## 2016-05-18 NOTE — Progress Notes (Addendum)
CM spoke with doctor regarding patient. Waiting on response from [facility].  If patient d/c from ED place an order to have patient discharged home with home health services with for a RN, PT, and SW.  Spoke with patient and family and choices of SNF for placement: [facility], [facility], and [facility].  Patient and family open to any other facility if placement offered.   CM offered any other equipment for use at home and declined for bedside commode and wheelchair.  EMS to transport patient home.  HH to follow up for placement for patient.

## 2016-05-18 NOTE — ED Notes (Signed)
Family at bedside. State they cannot take care of her at home due to she is walker dependent and can't use that for now due to sling. PA aware and is to speak with family.

## 2016-05-18 NOTE — NC FL2 (Signed)
[place] MEDICAID FL2 LEVEL OF CARE SCREENING TOOL     IDENTIFICATION  Patient Name: Tracy Walker Birthdate: September 20, 1928 Sex: female Admission Date (Current Location): 05/18/2016  [facility] and [place] Number:  [employer] and Address:  [facility],  [address], [place] [address]      Provider Number: [id number]  Attending Physician Name and Address:  Lavera Guise, MD  Relative Name and Phone Number:       Current Level of Care: Hospital Recommended Level of Care: Skilled Nursing Facility Prior Approval Number:    Date Approved/Denied:   PASRR Number: [id number]  Discharge Plan: SNF    Current Diagnoses: Patient Active Problem List   Diagnosis Date Noted  . Rotator cuff tear 06/15/2015  . Vaginal candidiasis 02/10/2014  . Abnormal EEG 02/08/2014  . UTI (lower urinary tract infection) 02/04/2014  . Acute encephalopathy 02/04/2014  . Hypertensive urgency 02/04/2014  . Acute on chronic renal failure (HCC) 02/04/2014  . A-fib (HCC) 02/04/2014  . CVA (cerebral infarction) 02/04/2014  . Overactive bladder   . Fatigue 08/30/2013  . Thyroid disease   . DVT (deep venous thrombosis) (HCC)   . DVT of lower extremity (deep venous thrombosis) (HCC) 08/09/2013  . Venous stasis of lower extremity 08/02/2013  . GERD (gastroesophageal reflux disease) 04/01/2013  . HTN (hypertension) 04/01/2013    Orientation RESPIRATION BLADDER Height & Weight     Self, Time, Situation, Place  Normal Continent Weight: 65.772 kg (145 lb) Height:  5\' 5"  (165.1 cm)  BEHAVIORAL SYMPTOMS/MOOD NEUROLOGICAL BOWEL NUTRITION STATUS      Continent Diet (Please see DC Summary)  AMBULATORY STATUS COMMUNICATION OF NEEDS Skin   Limited Assist Verbally Normal                       Personal Care Assistance Level of Assistance  Bathing, Feeding, Dressing Bathing Assistance: Limited assistance Feeding assistance: Limited assistance Dressing Assistance: Limited  assistance     Functional Limitations Info             SPECIAL CARE FACTORS FREQUENCY  PT (By licensed PT)     PT Frequency: 5x/week              Contractures      Additional Factors Info  Code Status, Allergies Code Status Info: DNR Allergies Info: NKA           Current Medications (05/18/2016):  This is the current hospital active medication list No current facility-administered medications for this encounter.   Current Outpatient Prescriptions  Medication Sig Dispense Refill  . amLODipine (NORVASC) 10 MG tablet TAKE 1 TABLET (10 MG TOTAL) BY MOUTH DAILY. 90 tablet 0  . aspirin 81 MG tablet Take 1 tablet (81 mg total) by mouth daily. (Patient taking differently: Take 81 mg by mouth 2 (two) times a week. ) 90 tablet 0  . levETIRAcetam (KEPPRA) 250 MG tablet TAKE 1 TABLET (250 MG TOTAL) BY MOUTH 2 (TWO) TIMES DAILY. 180 tablet 0  . losartan-hydrochlorothiazide (HYZAAR) 50-12.5 MG tablet Take 1 tablet by mouth daily. 90 tablet 1  . metoprolol (LOPRESSOR) 50 MG tablet TAKE 1 TABLET (50 MG TOTAL) BY MOUTH 2 (TWO) TIMES DAILY. 180 tablet 0  . oxybutynin (DITROPAN) 5 MG tablet Take 1 tablet (5 mg total) by mouth 3 (three) times daily. 270 tablet 1     Discharge Medications: Please see discharge summary for a list of discharge medications.  Relevant Imaging Results:  Relevant Lab Results:   Additional Information SSN: [id number] [place], [place]

## 2016-05-20 ENCOUNTER — Emergency Department (HOSPITAL_COMMUNITY): Payer: Commercial Managed Care - HMO

## 2016-05-20 ENCOUNTER — Encounter (HOSPITAL_COMMUNITY): Payer: Self-pay | Admitting: Emergency Medicine

## 2016-05-20 ENCOUNTER — Emergency Department (HOSPITAL_COMMUNITY)
Admission: EM | Admit: 2016-05-20 | Discharge: 2016-05-20 | Disposition: A | Discharge status: Skilled Nursing Facility | Payer: Commercial Managed Care - HMO | Attending: Emergency Medicine | Admitting: Emergency Medicine

## 2016-05-20 DIAGNOSIS — W06XXXA Fall from bed, initial encounter: Secondary | ICD-10-CM | POA: Insufficient documentation

## 2016-05-20 DIAGNOSIS — Z79899 Other long term (current) drug therapy: Secondary | ICD-10-CM | POA: Insufficient documentation

## 2016-05-20 DIAGNOSIS — I482 Chronic atrial fibrillation: Secondary | ICD-10-CM | POA: Diagnosis not present

## 2016-05-20 DIAGNOSIS — M84322D Stress fracture, left humerus, subsequent encounter for fracture with routine healing: Secondary | ICD-10-CM | POA: Diagnosis not present

## 2016-05-20 DIAGNOSIS — Y999 Unspecified external cause status: Secondary | ICD-10-CM | POA: Diagnosis not present

## 2016-05-20 DIAGNOSIS — Y929 Unspecified place or not applicable: Secondary | ICD-10-CM | POA: Diagnosis not present

## 2016-05-20 DIAGNOSIS — Y939 Activity, unspecified: Secondary | ICD-10-CM | POA: Diagnosis not present

## 2016-05-20 DIAGNOSIS — I129 Hypertensive chronic kidney disease with stage 1 through stage 4 chronic kidney disease, or unspecified chronic kidney disease: Secondary | ICD-10-CM | POA: Insufficient documentation

## 2016-05-20 DIAGNOSIS — I4891 Unspecified atrial fibrillation: Secondary | ICD-10-CM | POA: Insufficient documentation

## 2016-05-20 DIAGNOSIS — I10 Essential (primary) hypertension: Secondary | ICD-10-CM | POA: Diagnosis not present

## 2016-05-20 DIAGNOSIS — T148 Other injury of unspecified body region: Secondary | ICD-10-CM | POA: Diagnosis not present

## 2016-05-20 DIAGNOSIS — M25512 Pain in left shoulder: Secondary | ICD-10-CM | POA: Insufficient documentation

## 2016-05-20 DIAGNOSIS — N390 Urinary tract infection, site not specified: Secondary | ICD-10-CM | POA: Diagnosis not present

## 2016-05-20 DIAGNOSIS — Z743 Need for continuous supervision: Secondary | ICD-10-CM | POA: Diagnosis not present

## 2016-05-20 DIAGNOSIS — M1990 Unspecified osteoarthritis, unspecified site: Secondary | ICD-10-CM | POA: Diagnosis not present

## 2016-05-20 DIAGNOSIS — I6930 Unspecified sequelae of cerebral infarction: Secondary | ICD-10-CM | POA: Diagnosis not present

## 2016-05-20 DIAGNOSIS — R296 Repeated falls: Secondary | ICD-10-CM | POA: Diagnosis not present

## 2016-05-20 DIAGNOSIS — N183 Chronic kidney disease, stage 3 (moderate): Secondary | ICD-10-CM | POA: Diagnosis not present

## 2016-05-20 DIAGNOSIS — G9340 Encephalopathy, unspecified: Secondary | ICD-10-CM | POA: Diagnosis not present

## 2016-05-20 DIAGNOSIS — N189 Chronic kidney disease, unspecified: Secondary | ICD-10-CM | POA: Diagnosis not present

## 2016-05-20 DIAGNOSIS — M79651 Pain in right thigh: Secondary | ICD-10-CM | POA: Diagnosis not present

## 2016-05-20 DIAGNOSIS — M79602 Pain in left arm: Secondary | ICD-10-CM | POA: Diagnosis not present

## 2016-05-20 DIAGNOSIS — I638 Other cerebral infarction: Secondary | ICD-10-CM | POA: Diagnosis not present

## 2016-05-20 DIAGNOSIS — R279 Unspecified lack of coordination: Secondary | ICD-10-CM | POA: Diagnosis not present

## 2016-05-20 DIAGNOSIS — S42202D Unspecified fracture of upper end of left humerus, subsequent encounter for fracture with routine healing: Secondary | ICD-10-CM | POA: Diagnosis not present

## 2016-05-20 DIAGNOSIS — M84322A Stress fracture, left humerus, initial encounter for fracture: Secondary | ICD-10-CM | POA: Diagnosis not present

## 2016-05-20 DIAGNOSIS — M79652 Pain in left thigh: Secondary | ICD-10-CM | POA: Diagnosis not present

## 2016-05-20 DIAGNOSIS — R4182 Altered mental status, unspecified: Secondary | ICD-10-CM | POA: Diagnosis not present

## 2016-05-20 DIAGNOSIS — R410 Disorientation, unspecified: Secondary | ICD-10-CM | POA: Diagnosis not present

## 2016-05-20 DIAGNOSIS — W19XXXA Unspecified fall, initial encounter: Secondary | ICD-10-CM

## 2016-05-20 DIAGNOSIS — S42202A Unspecified fracture of upper end of left humerus, initial encounter for closed fracture: Secondary | ICD-10-CM | POA: Diagnosis not present

## 2016-05-20 DIAGNOSIS — Z7982 Long term (current) use of aspirin: Secondary | ICD-10-CM | POA: Diagnosis not present

## 2016-05-20 DIAGNOSIS — S4992XA Unspecified injury of left shoulder and upper arm, initial encounter: Secondary | ICD-10-CM | POA: Diagnosis not present

## 2016-05-20 DIAGNOSIS — N3281 Overactive bladder: Secondary | ICD-10-CM | POA: Diagnosis not present

## 2016-05-20 MED ORDER — HYDROCODONE-ACETAMINOPHEN 5-325 MG PO TABS
1.0000 | ORAL_TABLET | Freq: Once | ORAL | Status: AC
  Administered 2016-05-20: 1 via ORAL
  Filled 2016-05-20: qty 1

## 2016-05-20 NOTE — ED Notes (Signed)
Nurse at [facility] request to call them back at 4 pm because the nurses are in report now

## 2016-05-20 NOTE — ED Provider Notes (Signed)
[facility]-EM[facility]Y DEPT Provider Note   CSN: [id number] Arrival date & time: 05/20/16  5102  First Provider Contact:  First MD Initiated Contact with Patient 05/20/16 [phone]        History   Chief Complaint Chief Complaint  Patient presents with  . Fall    HPI Tracy Walker is a 82 y.o. female.  The history is provided by a relative (The patient fell out of bed today and is complaining of left shoulder pain again she injured that couple days ago. She also has some pain in both of her femurs).  Fall  This is a recurrent problem. The current episode started less than 1 hour ago. The problem occurs daily. The problem has been resolved. Pertinent negatives include no chest pain, no abdominal pain and no headaches. Exacerbated by: Movement. Nothing relieves the symptoms. She has tried nothing for the symptoms.    Past Medical History:  Diagnosis Date  . Arthritis   . CKD (chronic kidney disease)   . CVA (cerebral infarction)   . DVT (deep venous thrombosis) (HCC)    IVC filter  . GERD (gastroesophageal reflux disease)   . Hypertension   . Overactive bladder   . Thyroid disease    subclinical hypothyroidism    Patient Active Problem List   Diagnosis Date Noted  . Rotator cuff tear 06/15/2015  . Vaginal candidiasis 02/10/2014  . Abnormal EEG 02/08/2014  . UTI (lower urinary tract infection) 02/04/2014  . Acute encephalopathy 02/04/2014  . Hypertensive urgency 02/04/2014  . Acute on chronic renal failure (HCC) 02/04/2014  . A-fib (HCC) 02/04/2014  . CVA (cerebral infarction) 02/04/2014  . Overactive bladder   . Fatigue 08/30/2013  . Thyroid disease   . DVT (deep venous thrombosis) (HCC)   . DVT of lower extremity (deep venous thrombosis) (HCC) 08/09/2013  . Venous stasis of lower extremity 08/02/2013  . GERD (gastroesophageal reflux disease) 04/01/2013  . HTN (hypertension) 04/01/2013    Past Surgical History:  Procedure Laterality Date  . ABDOMINAL HYSTERECTOMY      . IVC filter    . TOTAL HIP ARTHROPLASTY     right  . TOTAL KNEE ARTHROPLASTY     Left    OB History    No data available       Home Medications    Prior to Admission medications   Medication Sig Start Date End Date Taking? Authorizing Provider  amLODipine (NORVASC) 10 MG tablet TAKE 1 TABLET (10 MG TOTAL) BY MOUTH DAILY. 02/05/16  Yes Frederica Kuster, MD  aspirin EC 81 MG tablet Take 81 mg by mouth 2 (two) times a week.   Yes Historical Provider, MD  HYDROcodone-acetaminophen (NORCO/VICODIN) 5-325 MG tablet Take one tab po q 6 hrs prn pain 05/18/16  Yes Tammy Triplett, PA-C  levETIRAcetam (KEPPRA) 250 MG tablet TAKE 1 TABLET (250 MG TOTAL) BY MOUTH 2 (TWO) TIMES DAILY. 02/05/16  Yes Frederica Kuster, MD  losartan-hydrochlorothiazide (HYZAAR) 50-12.5 MG tablet Take 1 tablet by mouth daily. 02/05/16  Yes Frederica Kuster, MD  metoprolol (LOPRESSOR) 50 MG tablet TAKE 1 TABLET (50 MG TOTAL) BY MOUTH 2 (TWO) TIMES DAILY. 02/05/16  Yes Frederica Kuster, MD  oxybutynin (DITROPAN) 5 MG tablet Take 1 tablet (5 mg total) by mouth 3 (three) times daily. 02/05/16  Yes Frederica Kuster, MD  aspirin 81 MG tablet Take 1 tablet (81 mg total) by mouth daily. Patient not taking: Reported on 05/20/2016 02/05/16   Frederica Kuster,  MD    Family History Family History  Problem Relation Age of Onset  . Pneumonia Mother 63    Social History Social History  Substance Use Topics  . Smoking status: Never Smoker  . Smokeless tobacco: Never Used  . Alcohol use No     Allergies   Review of patient's allergies indicates no known allergies.   Review of Systems Review of Systems  Constitutional: Negative for appetite change and fatigue.  HENT: Negative for congestion, ear discharge and sinus pressure.   Eyes: Negative for discharge.  Respiratory: Negative for cough.   Cardiovascular: Negative for chest pain.  Gastrointestinal: Negative for abdominal pain and diarrhea.  Genitourinary: Negative for  frequency and hematuria.  Musculoskeletal: Negative for back pain.       Pain in left shoulder and some pain in both femurs  Skin: Negative for rash.  Neurological: Negative for seizures and headaches.  Psychiatric/Behavioral: Negative for hallucinations.     Physical Exam Updated Vital Signs BP 145/79   Pulse 66   Temp 99.6 F (37.6 C) (Oral)   Resp 18   Ht  (1.651 m)   Wt 145 lb (65.8 kg)   SpO2 95%   BMI 24.13 kg/m   Physical Exam  Constitutional: She is oriented to person, place, and time. She appears well-developed.  HENT:  Head: Normocephalic.  Eyes: Conjunctivae and EOM are normal. No scleral icterus.  Neck: Neck supple. No thyromegaly present.  Cardiovascular: Normal rate and regular rhythm.  Exam reveals no gallop and no friction rub.   No murmur heard. Pulmonary/Chest: No stridor. She has no wheezes. She has no rales. She exhibits no tenderness.  Abdominal: She exhibits no distension. There is no tenderness. There is no rebound.  Musculoskeletal: Normal range of motion. She exhibits no edema.  Tender left shoulder and mild tenderness to both femurs neurovascular exams normal  Lymphadenopathy:    She has no cervical adenopathy.  Neurological: She is oriented to person, place, and time. She exhibits normal muscle tone. Coordination normal.  Skin: No rash noted. No erythema.  Psychiatric: She has a normal mood and affect. Her behavior is normal.     ED Treatments / Results  Labs (all labs ordered are listed, but only abnormal results are displayed) Labs Reviewed - No data to display  EKG  EKG Interpretation None       Radiology Dg Shoulder Left  Result Date: 05/20/2016 CLINICAL DATA:  Status post fall striking the left shoulder this morning ; recently fractured the shoulder 2 days ago. EXAM: LEFT SHOULDER - 2+ VIEW COMPARISON:  Left shoulder series of May 18, 2016 FINDINGS: The bones is subjectively osteopenic. There is an impacted slightly  angulated fracture of the humeral neck. This is stable. The observed portions of the humeral shaft are intact. The scapula and clavicle appear intact. There are old anterior rib deformities on the left. The overlying soft tissues are normal. IMPRESSION: Stable appearance of the impacted fracture of the left humeral neck. No new fractures. Electronically Signed   By: David  [place]az[place] M.D.   On: 05/20/2016 08:20  Dg Femur Min 2 Views Left  Result Date: 05/20/2016 CLINICAL DATA:  Pain following fall EXAM: LEFT FEMUR 2 VIEWS COMPARISON:  None. FINDINGS: Frontal and lateral views were obtained. There is evidence of screw and plate fixation proximal femoral region. Tip of the screw is in the proximal femoral head. Is a total knee replacement with the femoral and tibial prosthetic components appearing well-seated.  There is no appreciable acute fracture or dislocation. There is slight narrowing of the left hip joint. Bones appear somewhat osteoporotic. No erosive change. There are foci of arterial vascular calcification. IMPRESSION: Areas of postoperative change. No acute fracture or dislocation. Areas of aortic atherosclerosis. Mild narrowing left hip joint. Bones appear osteoporotic. Electronically Signed   By: Bretta Bang III M.D.   On: 05/20/2016 10:00  Dg Femur, Min 2 Views Right  Result Date: 05/20/2016 CLINICAL DATA:  Pain following fall EXAM: RIGHT FEMUR 2 VIEWS COMPARISON:  February 04, 2014 pelvis radiograph FINDINGS: Frontal and lateral views were obtained. There is a total hip prosthesis on the right with prosthetic component well-seated. Bones are osteoporotic. There is no acute fracture or dislocation. There is stable old trauma involving the lesser trochanter on the right. There is moderate osteoarthritic change in the knee joint region. No knee joint effusion. IMPRESSION: Status post total hip replacement with prosthesis well-seated. Extensive arthropathy in the knee joint no knee joint effusion. No  acute fracture or dislocation. Evidence of old trauma involving the lesser trochanter on the right with remodeling. Electronically Signed   By: Bretta Bang III M.D.   On: 05/20/2016 10:03   Procedures Procedures (including critical care time)  Medications Ordered in ED Medications  HYDROcodone-acetaminophen (NORCO/VICODIN) 5-325 MG per tablet 1 tablet (1 tablet Oral Given 05/20/16 1032)     Initial Impression / Assessment and Plan / ED Course  I have reviewed the triage vital signs and the nursing notes.  Pertinent labs & imaging results that were available during my care of the patient were reviewed by me and considered in my medical decision making (see chart for details).  Clinical Course    Patient with previous fracture of left shoulder and now contusion to that shoulder and on top of it along with pain in both of his hips. She will be discharged home and follow-up with orthopedics  Final Clinical Impressions(s) / ED Diagnoses   Final diagnoses:  None    New Prescriptions New Prescriptions   No medications on file     Bethann Berkshire, MD 05/20/16 [phone]

## 2016-05-20 NOTE — ED Notes (Signed)
Attempted to call report [facility] in [place] to give report. Nurse not available at present

## 2016-05-20 NOTE — ED Triage Notes (Addendum)
Pt reports fell last Saturday and was diagnosed with fractured humerus. Pt fell out of bed this am and reports continued left arm. Pt denies loc. Pt alert and oriented. Sling noted on LUE at time of arrival with EMS.   LLE shorteninig noted. Per EMS, this is baseline for patient since Left Hip replacement several years ago.

## 2016-05-20 NOTE — ED Notes (Signed)
Pt c/o left arm pain after rolling out of bed onto floor this am. Pt fell on Saturday fracturing left humerus. Pt denies loc. Denies pain other than arm. nad noted.

## 2016-05-20 NOTE — Clinical Social Work Note (Signed)
CSW received referral from ED for placement. Met with pt, pt's son Abe People, and daughter-in-law Joycelyn Schmid at bedside. Pt fell on Saturday, fracturing her humerus. In ED, PT evaluation recommended SNF. CSW started process, but pt went home at that time due to no insurance authorization. CSW followed up with [facility]) this morning and pt has been approved for [facility] Facility has bed on 4 bed ward which pt and family were agreeable to. EDP requested for family to discuss with PCP and have him complete FL2 and prescriptions. Family is going to PCP office for this to be completed and then plan for pt to go to [facility]. Facility updated.   [name], [place]

## 2016-05-20 NOTE — ED Notes (Signed)
Family is going to patients PCP to have FL 2 completed and  Rx. Family will take to [facility] and then patient will be able to come. Patient will be going to hall [address]. Report can be called at [phone]

## 2016-05-20 NOTE — Discharge Instructions (Signed)
Follow-up with Dr. Romeo Apple this week for your shoulder fracture

## 2016-05-20 NOTE — ED Notes (Signed)
Report given to Elgie Congo, LPN at [facility]

## 2016-05-20 NOTE — ED Notes (Signed)
Pt with increase pain with transfer to stretcher . Per Dr Jacqulyn Bath may repeat vicodin 5/325mg 

## 2016-05-22 ENCOUNTER — Telehealth (HOSPITAL_BASED_OUTPATIENT_CLINIC_OR_DEPARTMENT_OTHER): Payer: Self-pay | Admitting: Emergency Medicine

## 2016-05-27 DIAGNOSIS — N183 Chronic kidney disease, stage 3 (moderate): Secondary | ICD-10-CM | POA: Diagnosis not present

## 2016-05-27 DIAGNOSIS — I638 Other cerebral infarction: Secondary | ICD-10-CM | POA: Diagnosis not present

## 2016-05-27 DIAGNOSIS — I482 Chronic atrial fibrillation: Secondary | ICD-10-CM | POA: Diagnosis not present

## 2016-05-27 DIAGNOSIS — M84322A Stress fracture, left humerus, initial encounter for fracture: Secondary | ICD-10-CM | POA: Diagnosis not present

## 2016-05-27 DIAGNOSIS — I10 Essential (primary) hypertension: Secondary | ICD-10-CM | POA: Diagnosis not present

## 2016-05-30 ENCOUNTER — Ambulatory Visit (INDEPENDENT_AMBULATORY_CARE_PROVIDER_SITE_OTHER): Payer: Commercial Managed Care - HMO | Admitting: Orthopedic Surgery

## 2016-05-30 VITALS — BP 163/144 | HR 57 | Ht 60.0 in | Wt 143.0 lb

## 2016-05-30 DIAGNOSIS — S42202A Unspecified fracture of upper end of left humerus, initial encounter for closed fracture: Secondary | ICD-10-CM | POA: Diagnosis not present

## 2016-05-30 NOTE — Progress Notes (Signed)
Chief Complaint  Patient presents with  . Follow-up    Left humerus fracture, DOI 05-18-16.   HPI 82 year old female new patient sustained a proximal humerus fracture on the left side 12 days ago. She also injured her right thigh. She is status post right bipolar hip replacement for hip fracture. She has osteoarthritis of the right knee receives cortisone injections on a monthly basis  Status post left total knee arthroplasty and status post left hip dynamic hip screw for peritrochanteric fracture  Complains of left shoulder pain dull throbbing moderate 12 days duration constant  She was using a walker to ambulate prior to this fall and says she fractured her shoulder she had to be placed at the [facility]  Review of Systems  Constitutional: Negative for chills and fever.  Respiratory: Negative for shortness of breath.   Cardiovascular: Negative for chest pain.  Musculoskeletal: Positive for myalgias.       Right thigh pain  Endo/Heme/Allergies: Does not bruise/bleed easily.    Past Medical History:  Diagnosis Date  . Arthritis   . CKD (chronic kidney disease)   . CVA (cerebral infarction)   . DVT (deep venous thrombosis) (HCC)    IVC filter  . GERD (gastroesophageal reflux disease)   . Hypertension   . Overactive bladder   . Thyroid disease    subclinical hypothyroidism    Past Surgical History:  Procedure Laterality Date  . ABDOMINAL HYSTERECTOMY    . IVC filter    . TOTAL HIP ARTHROPLASTY     right  . TOTAL KNEE ARTHROPLASTY     Left   Family History  Problem Relation Age of Onset  . Pneumonia Mother 55   Social History  Substance Use Topics  . Smoking status: Never Smoker  . Smokeless tobacco: Never Used  . Alcohol use No    Current Outpatient Prescriptions:  .  amLODipine (NORVASC) 10 MG tablet, TAKE 1 TABLET (10 MG TOTAL) BY MOUTH DAILY., Disp: 90 tablet, Rfl: 0 .  aspirin 81 MG tablet, Take 1 tablet (81 mg total) by mouth daily. (Patient not taking:  Reported on 05/20/2016), Disp: 90 tablet, Rfl: 0 .  aspirin EC 81 MG tablet, Take 81 mg by mouth 2 (two) times a week., Disp: , Rfl:  .  HYDROcodone-acetaminophen (NORCO/VICODIN) 5-325 MG tablet, Take one tab po q 6 hrs prn pain, Disp: 15 tablet, Rfl: 0 .  levETIRAcetam (KEPPRA) 250 MG tablet, TAKE 1 TABLET (250 MG TOTAL) BY MOUTH 2 (TWO) TIMES DAILY., Disp: 180 tablet, Rfl: 0 .  losartan-hydrochlorothiazide (HYZAAR) 50-12.5 MG tablet, Take 1 tablet by mouth daily., Disp: 90 tablet, Rfl: 1 .  metoprolol (LOPRESSOR) 50 MG tablet, TAKE 1 TABLET (50 MG TOTAL) BY MOUTH 2 (TWO) TIMES DAILY., Disp: 180 tablet, Rfl: 0 .  oxybutynin (DITROPAN) 5 MG tablet, Take 1 tablet (5 mg total) by mouth 3 (three) times daily., Disp: 270 tablet, Rfl: 1  BP (!) 163/144   Pulse (!) 57   Ht 5' (1.524 m)   Wt 143 lb (64.9 kg)   BMI 27.93 kg/m   Physical Exam Gen. appearance she has a small frame but appears vibrant. He is oriented to person and place. Her mood is pleasant her affect is normal  She is ambulatory with a wheelchair   Ortho Exam Her left shoulder is tender to palpation left arm is severely bruised and ecchymotic she has normal range of motion the elbow wrist and hand and painful range of motion limited  in the shoulder. Elbow stability and wrist stability normal shoulder stability not tested muscle tone normal in the entire left arm skin ecchymotic and bruised but intact left arm  Good pulse in temperature normal and the left hand and wrist no swelling distally. Sensation normal and epitrochlear and axillary lymph nodes were normal    ASSESSMENT: My personal interpretation of the images:  Left hip x-ray dynamic hip screw slight varus alignment but healed fracture left total knee stable no loosening  Right bipolar press-fit stem for hip fracture stable and right knee osteoarthritis  Left shoulder nondisplaced Neer criteria nonoperative treatment left proximal humerus  fracture      PLAN X-ray on Monday films are be overnighted to [place] and then we will call the [facility] regarding physical therapy and weightbearing status regarding the left shoulder  Follow-up 4 weeks from Wednesday of next week for range of motion check  Fuller Canada, MD 05/30/2016 5:18 PM

## 2016-05-30 NOTE — Patient Instructions (Signed)
To have Xray done Monday 06/03/16 by mobile unit at [facility]

## 2016-05-31 ENCOUNTER — Ambulatory Visit: Payer: Commercial Managed Care - HMO | Admitting: Family Medicine

## 2016-06-05 ENCOUNTER — Telehealth: Payer: Self-pay | Admitting: Orthopedic Surgery

## 2016-06-05 NOTE — Telephone Encounter (Signed)
Per your last note, we now have the films and xray report on this patient.  Please review these and call Thayer OhmChris in PT  at [facility] in [place] at [phone].  He wants to know whether Ms. Laurell JosephsBurke needs to have PT or not and also her weight bearing status.  Thanks

## 2016-06-27 DIAGNOSIS — M84322A Stress fracture, left humerus, initial encounter for fracture: Secondary | ICD-10-CM | POA: Diagnosis not present

## 2016-06-27 DIAGNOSIS — I10 Essential (primary) hypertension: Secondary | ICD-10-CM | POA: Diagnosis not present

## 2016-06-27 DIAGNOSIS — I638 Other cerebral infarction: Secondary | ICD-10-CM | POA: Diagnosis not present

## 2016-06-27 DIAGNOSIS — N183 Chronic kidney disease, stage 3 (moderate): Secondary | ICD-10-CM | POA: Diagnosis not present

## 2016-06-27 DIAGNOSIS — I482 Chronic atrial fibrillation: Secondary | ICD-10-CM | POA: Diagnosis not present

## 2016-07-02 ENCOUNTER — Encounter: Payer: Self-pay | Admitting: Orthopedic Surgery

## 2016-07-02 ENCOUNTER — Ambulatory Visit (INDEPENDENT_AMBULATORY_CARE_PROVIDER_SITE_OTHER): Payer: Commercial Managed Care - HMO | Admitting: Orthopedic Surgery

## 2016-07-02 VITALS — BP 141/63 | HR 56 | Temp 99.5°F

## 2016-07-02 DIAGNOSIS — S42202A Unspecified fracture of upper end of left humerus, initial encounter for closed fracture: Secondary | ICD-10-CM

## 2016-07-02 NOTE — Patient Instructions (Signed)
Weightbearing as tolerated!

## 2016-07-02 NOTE — Progress Notes (Signed)
Patient ID: Tracy Walker, female   DOB: 01/29/1928, 82 y.o.   MRN: [id number]  Fracture care visit  Chief Complaint  Patient presents with  . Follow-up    LEFT PROXIMAL HUMERUS FRACTURE, DOI 05/18/16    BP (!) 141/63   Pulse (!) 56   Temp 99.5 F (37.5 C)   6 weeks proximally left proximal humerus fracture are he started physical therapy last x-ray was in August on the seventh the mobile unit. I was able to look at those films she has a newly displaced left proximal humerus fracture  Today she could flex the arm actively 85 passively 110 external rotation 45      ASSESSMENT AND PLAN   Recommend weight-bear as tolerated advance therapy as tolerated follow-up as needed  Send AP lateral x-ray to me to review but she can proceed with weightbearing.

## 2016-07-03 DIAGNOSIS — S42292D Other displaced fracture of upper end of left humerus, subsequent encounter for fracture with routine healing: Secondary | ICD-10-CM | POA: Diagnosis not present

## 2016-07-08 ENCOUNTER — Telehealth: Payer: Self-pay | Admitting: Orthopedic Surgery

## 2016-07-08 NOTE — Telephone Encounter (Signed)
Patient was seen on 07-02-16 for 6 weeks fracture proximal humerus.  Per your note for this day, you asked that they get an xray and that they send AP lateral x-ray for you to review.  We have received the x-rays today and we will have them in your box for you to review.

## 2016-07-09 DIAGNOSIS — R4189 Other symptoms and signs involving cognitive functions and awareness: Secondary | ICD-10-CM | POA: Diagnosis not present

## 2016-07-09 DIAGNOSIS — R41841 Cognitive communication deficit: Secondary | ICD-10-CM | POA: Diagnosis not present

## 2016-07-09 DIAGNOSIS — M6281 Muscle weakness (generalized): Secondary | ICD-10-CM | POA: Diagnosis not present

## 2016-07-09 DIAGNOSIS — R279 Unspecified lack of coordination: Secondary | ICD-10-CM | POA: Diagnosis not present

## 2016-07-09 DIAGNOSIS — M84322D Stress fracture, left humerus, subsequent encounter for fracture with routine healing: Secondary | ICD-10-CM | POA: Diagnosis not present

## 2016-07-10 DIAGNOSIS — R4189 Other symptoms and signs involving cognitive functions and awareness: Secondary | ICD-10-CM | POA: Diagnosis not present

## 2016-07-10 DIAGNOSIS — R279 Unspecified lack of coordination: Secondary | ICD-10-CM | POA: Diagnosis not present

## 2016-07-10 DIAGNOSIS — R41841 Cognitive communication deficit: Secondary | ICD-10-CM | POA: Diagnosis not present

## 2016-07-10 DIAGNOSIS — M6281 Muscle weakness (generalized): Secondary | ICD-10-CM | POA: Diagnosis not present

## 2016-07-10 DIAGNOSIS — M84322D Stress fracture, left humerus, subsequent encounter for fracture with routine healing: Secondary | ICD-10-CM | POA: Diagnosis not present

## 2016-07-11 DIAGNOSIS — R279 Unspecified lack of coordination: Secondary | ICD-10-CM | POA: Diagnosis not present

## 2016-07-11 DIAGNOSIS — M6281 Muscle weakness (generalized): Secondary | ICD-10-CM | POA: Diagnosis not present

## 2016-07-11 DIAGNOSIS — R41841 Cognitive communication deficit: Secondary | ICD-10-CM | POA: Diagnosis not present

## 2016-07-11 DIAGNOSIS — M84322D Stress fracture, left humerus, subsequent encounter for fracture with routine healing: Secondary | ICD-10-CM | POA: Diagnosis not present

## 2016-07-11 DIAGNOSIS — R4189 Other symptoms and signs involving cognitive functions and awareness: Secondary | ICD-10-CM | POA: Diagnosis not present

## 2016-07-12 DIAGNOSIS — R279 Unspecified lack of coordination: Secondary | ICD-10-CM | POA: Diagnosis not present

## 2016-07-12 DIAGNOSIS — R4189 Other symptoms and signs involving cognitive functions and awareness: Secondary | ICD-10-CM | POA: Diagnosis not present

## 2016-07-12 DIAGNOSIS — M84322D Stress fracture, left humerus, subsequent encounter for fracture with routine healing: Secondary | ICD-10-CM | POA: Diagnosis not present

## 2016-07-12 DIAGNOSIS — R41841 Cognitive communication deficit: Secondary | ICD-10-CM | POA: Diagnosis not present

## 2016-07-12 DIAGNOSIS — M6281 Muscle weakness (generalized): Secondary | ICD-10-CM | POA: Diagnosis not present

## 2016-07-15 DIAGNOSIS — R41841 Cognitive communication deficit: Secondary | ICD-10-CM | POA: Diagnosis not present

## 2016-07-15 DIAGNOSIS — R279 Unspecified lack of coordination: Secondary | ICD-10-CM | POA: Diagnosis not present

## 2016-07-15 DIAGNOSIS — M6281 Muscle weakness (generalized): Secondary | ICD-10-CM | POA: Diagnosis not present

## 2016-07-15 DIAGNOSIS — R4189 Other symptoms and signs involving cognitive functions and awareness: Secondary | ICD-10-CM | POA: Diagnosis not present

## 2016-07-15 DIAGNOSIS — M84322D Stress fracture, left humerus, subsequent encounter for fracture with routine healing: Secondary | ICD-10-CM | POA: Diagnosis not present

## 2016-07-15 DIAGNOSIS — H353131 Nonexudative age-related macular degeneration, bilateral, early dry stage: Secondary | ICD-10-CM | POA: Diagnosis not present

## 2016-07-15 DIAGNOSIS — H26492 Other secondary cataract, left eye: Secondary | ICD-10-CM | POA: Diagnosis not present

## 2016-07-15 DIAGNOSIS — Z961 Presence of intraocular lens: Secondary | ICD-10-CM | POA: Diagnosis not present

## 2016-07-16 DIAGNOSIS — R279 Unspecified lack of coordination: Secondary | ICD-10-CM | POA: Diagnosis not present

## 2016-07-16 DIAGNOSIS — M84322D Stress fracture, left humerus, subsequent encounter for fracture with routine healing: Secondary | ICD-10-CM | POA: Diagnosis not present

## 2016-07-16 DIAGNOSIS — M6281 Muscle weakness (generalized): Secondary | ICD-10-CM | POA: Diagnosis not present

## 2016-07-16 DIAGNOSIS — R4189 Other symptoms and signs involving cognitive functions and awareness: Secondary | ICD-10-CM | POA: Diagnosis not present

## 2016-07-16 DIAGNOSIS — R41841 Cognitive communication deficit: Secondary | ICD-10-CM | POA: Diagnosis not present

## 2016-07-17 DIAGNOSIS — R279 Unspecified lack of coordination: Secondary | ICD-10-CM | POA: Diagnosis not present

## 2016-07-17 DIAGNOSIS — R41841 Cognitive communication deficit: Secondary | ICD-10-CM | POA: Diagnosis not present

## 2016-07-17 DIAGNOSIS — R4189 Other symptoms and signs involving cognitive functions and awareness: Secondary | ICD-10-CM | POA: Diagnosis not present

## 2016-07-17 DIAGNOSIS — M84322D Stress fracture, left humerus, subsequent encounter for fracture with routine healing: Secondary | ICD-10-CM | POA: Diagnosis not present

## 2016-07-17 DIAGNOSIS — M6281 Muscle weakness (generalized): Secondary | ICD-10-CM | POA: Diagnosis not present

## 2016-07-18 DIAGNOSIS — R41841 Cognitive communication deficit: Secondary | ICD-10-CM | POA: Diagnosis not present

## 2016-07-18 DIAGNOSIS — M84322D Stress fracture, left humerus, subsequent encounter for fracture with routine healing: Secondary | ICD-10-CM | POA: Diagnosis not present

## 2016-07-18 DIAGNOSIS — M6281 Muscle weakness (generalized): Secondary | ICD-10-CM | POA: Diagnosis not present

## 2016-07-18 DIAGNOSIS — R4189 Other symptoms and signs involving cognitive functions and awareness: Secondary | ICD-10-CM | POA: Diagnosis not present

## 2016-07-18 DIAGNOSIS — R279 Unspecified lack of coordination: Secondary | ICD-10-CM | POA: Diagnosis not present

## 2016-07-19 DIAGNOSIS — R41841 Cognitive communication deficit: Secondary | ICD-10-CM | POA: Diagnosis not present

## 2016-07-19 DIAGNOSIS — R4189 Other symptoms and signs involving cognitive functions and awareness: Secondary | ICD-10-CM | POA: Diagnosis not present

## 2016-07-19 DIAGNOSIS — M84322D Stress fracture, left humerus, subsequent encounter for fracture with routine healing: Secondary | ICD-10-CM | POA: Diagnosis not present

## 2016-07-19 DIAGNOSIS — R279 Unspecified lack of coordination: Secondary | ICD-10-CM | POA: Diagnosis not present

## 2016-07-19 DIAGNOSIS — M6281 Muscle weakness (generalized): Secondary | ICD-10-CM | POA: Diagnosis not present

## 2016-07-22 DIAGNOSIS — R4189 Other symptoms and signs involving cognitive functions and awareness: Secondary | ICD-10-CM | POA: Diagnosis not present

## 2016-07-22 DIAGNOSIS — M6281 Muscle weakness (generalized): Secondary | ICD-10-CM | POA: Diagnosis not present

## 2016-07-22 DIAGNOSIS — R41841 Cognitive communication deficit: Secondary | ICD-10-CM | POA: Diagnosis not present

## 2016-07-22 DIAGNOSIS — M84322D Stress fracture, left humerus, subsequent encounter for fracture with routine healing: Secondary | ICD-10-CM | POA: Diagnosis not present

## 2016-07-22 DIAGNOSIS — R279 Unspecified lack of coordination: Secondary | ICD-10-CM | POA: Diagnosis not present

## 2016-07-23 DIAGNOSIS — R4189 Other symptoms and signs involving cognitive functions and awareness: Secondary | ICD-10-CM | POA: Diagnosis not present

## 2016-07-23 DIAGNOSIS — R41841 Cognitive communication deficit: Secondary | ICD-10-CM | POA: Diagnosis not present

## 2016-07-23 DIAGNOSIS — M6281 Muscle weakness (generalized): Secondary | ICD-10-CM | POA: Diagnosis not present

## 2016-07-23 DIAGNOSIS — R279 Unspecified lack of coordination: Secondary | ICD-10-CM | POA: Diagnosis not present

## 2016-07-23 DIAGNOSIS — M84322D Stress fracture, left humerus, subsequent encounter for fracture with routine healing: Secondary | ICD-10-CM | POA: Diagnosis not present

## 2016-07-24 DIAGNOSIS — R41841 Cognitive communication deficit: Secondary | ICD-10-CM | POA: Diagnosis not present

## 2016-07-24 DIAGNOSIS — M6281 Muscle weakness (generalized): Secondary | ICD-10-CM | POA: Diagnosis not present

## 2016-07-24 DIAGNOSIS — M84322D Stress fracture, left humerus, subsequent encounter for fracture with routine healing: Secondary | ICD-10-CM | POA: Diagnosis not present

## 2016-07-24 DIAGNOSIS — R279 Unspecified lack of coordination: Secondary | ICD-10-CM | POA: Diagnosis not present

## 2016-07-24 DIAGNOSIS — R4189 Other symptoms and signs involving cognitive functions and awareness: Secondary | ICD-10-CM | POA: Diagnosis not present

## 2016-07-25 DIAGNOSIS — M84322D Stress fracture, left humerus, subsequent encounter for fracture with routine healing: Secondary | ICD-10-CM | POA: Diagnosis not present

## 2016-07-25 DIAGNOSIS — R279 Unspecified lack of coordination: Secondary | ICD-10-CM | POA: Diagnosis not present

## 2016-07-25 DIAGNOSIS — R4189 Other symptoms and signs involving cognitive functions and awareness: Secondary | ICD-10-CM | POA: Diagnosis not present

## 2016-07-25 DIAGNOSIS — M6281 Muscle weakness (generalized): Secondary | ICD-10-CM | POA: Diagnosis not present

## 2016-07-25 DIAGNOSIS — R41841 Cognitive communication deficit: Secondary | ICD-10-CM | POA: Diagnosis not present

## 2016-07-26 DIAGNOSIS — R4189 Other symptoms and signs involving cognitive functions and awareness: Secondary | ICD-10-CM | POA: Diagnosis not present

## 2016-07-26 DIAGNOSIS — R279 Unspecified lack of coordination: Secondary | ICD-10-CM | POA: Diagnosis not present

## 2016-07-26 DIAGNOSIS — R41841 Cognitive communication deficit: Secondary | ICD-10-CM | POA: Diagnosis not present

## 2016-07-26 DIAGNOSIS — M6281 Muscle weakness (generalized): Secondary | ICD-10-CM | POA: Diagnosis not present

## 2016-07-26 DIAGNOSIS — M84322D Stress fracture, left humerus, subsequent encounter for fracture with routine healing: Secondary | ICD-10-CM | POA: Diagnosis not present

## 2016-07-29 DIAGNOSIS — R4189 Other symptoms and signs involving cognitive functions and awareness: Secondary | ICD-10-CM | POA: Diagnosis not present

## 2016-07-29 DIAGNOSIS — R41841 Cognitive communication deficit: Secondary | ICD-10-CM | POA: Diagnosis not present

## 2016-07-29 DIAGNOSIS — M84322D Stress fracture, left humerus, subsequent encounter for fracture with routine healing: Secondary | ICD-10-CM | POA: Diagnosis not present

## 2016-07-29 DIAGNOSIS — M6281 Muscle weakness (generalized): Secondary | ICD-10-CM | POA: Diagnosis not present

## 2016-07-29 DIAGNOSIS — R279 Unspecified lack of coordination: Secondary | ICD-10-CM | POA: Diagnosis not present

## 2016-07-30 DIAGNOSIS — M84322D Stress fracture, left humerus, subsequent encounter for fracture with routine healing: Secondary | ICD-10-CM | POA: Diagnosis not present

## 2016-07-30 DIAGNOSIS — M6281 Muscle weakness (generalized): Secondary | ICD-10-CM | POA: Diagnosis not present

## 2016-07-30 DIAGNOSIS — R4189 Other symptoms and signs involving cognitive functions and awareness: Secondary | ICD-10-CM | POA: Diagnosis not present

## 2016-07-30 DIAGNOSIS — R41841 Cognitive communication deficit: Secondary | ICD-10-CM | POA: Diagnosis not present

## 2016-07-30 DIAGNOSIS — R279 Unspecified lack of coordination: Secondary | ICD-10-CM | POA: Diagnosis not present

## 2016-07-31 DIAGNOSIS — M6281 Muscle weakness (generalized): Secondary | ICD-10-CM | POA: Diagnosis not present

## 2016-07-31 DIAGNOSIS — R4189 Other symptoms and signs involving cognitive functions and awareness: Secondary | ICD-10-CM | POA: Diagnosis not present

## 2016-07-31 DIAGNOSIS — R41841 Cognitive communication deficit: Secondary | ICD-10-CM | POA: Diagnosis not present

## 2016-07-31 DIAGNOSIS — M84322D Stress fracture, left humerus, subsequent encounter for fracture with routine healing: Secondary | ICD-10-CM | POA: Diagnosis not present

## 2016-07-31 DIAGNOSIS — R279 Unspecified lack of coordination: Secondary | ICD-10-CM | POA: Diagnosis not present

## 2016-08-01 DIAGNOSIS — M6281 Muscle weakness (generalized): Secondary | ICD-10-CM | POA: Diagnosis not present

## 2016-08-01 DIAGNOSIS — R279 Unspecified lack of coordination: Secondary | ICD-10-CM | POA: Diagnosis not present

## 2016-08-01 DIAGNOSIS — R41841 Cognitive communication deficit: Secondary | ICD-10-CM | POA: Diagnosis not present

## 2016-08-01 DIAGNOSIS — R4189 Other symptoms and signs involving cognitive functions and awareness: Secondary | ICD-10-CM | POA: Diagnosis not present

## 2016-08-01 DIAGNOSIS — M84322D Stress fracture, left humerus, subsequent encounter for fracture with routine healing: Secondary | ICD-10-CM | POA: Diagnosis not present

## 2016-08-02 DIAGNOSIS — R41841 Cognitive communication deficit: Secondary | ICD-10-CM | POA: Diagnosis not present

## 2016-08-02 DIAGNOSIS — M84322D Stress fracture, left humerus, subsequent encounter for fracture with routine healing: Secondary | ICD-10-CM | POA: Diagnosis not present

## 2016-08-02 DIAGNOSIS — R279 Unspecified lack of coordination: Secondary | ICD-10-CM | POA: Diagnosis not present

## 2016-08-02 DIAGNOSIS — R4189 Other symptoms and signs involving cognitive functions and awareness: Secondary | ICD-10-CM | POA: Diagnosis not present

## 2016-08-02 DIAGNOSIS — M6281 Muscle weakness (generalized): Secondary | ICD-10-CM | POA: Diagnosis not present

## 2016-08-05 DIAGNOSIS — M84322D Stress fracture, left humerus, subsequent encounter for fracture with routine healing: Secondary | ICD-10-CM | POA: Diagnosis not present

## 2016-08-05 DIAGNOSIS — R41841 Cognitive communication deficit: Secondary | ICD-10-CM | POA: Diagnosis not present

## 2016-08-05 DIAGNOSIS — R4189 Other symptoms and signs involving cognitive functions and awareness: Secondary | ICD-10-CM | POA: Diagnosis not present

## 2016-08-05 DIAGNOSIS — M6281 Muscle weakness (generalized): Secondary | ICD-10-CM | POA: Diagnosis not present

## 2016-08-05 DIAGNOSIS — R279 Unspecified lack of coordination: Secondary | ICD-10-CM | POA: Diagnosis not present

## 2016-08-06 DIAGNOSIS — M84322D Stress fracture, left humerus, subsequent encounter for fracture with routine healing: Secondary | ICD-10-CM | POA: Diagnosis not present

## 2016-08-06 DIAGNOSIS — R279 Unspecified lack of coordination: Secondary | ICD-10-CM | POA: Diagnosis not present

## 2016-08-06 DIAGNOSIS — R41841 Cognitive communication deficit: Secondary | ICD-10-CM | POA: Diagnosis not present

## 2016-08-06 DIAGNOSIS — M6281 Muscle weakness (generalized): Secondary | ICD-10-CM | POA: Diagnosis not present

## 2016-08-06 DIAGNOSIS — R4189 Other symptoms and signs involving cognitive functions and awareness: Secondary | ICD-10-CM | POA: Diagnosis not present

## 2016-08-07 DIAGNOSIS — M84322D Stress fracture, left humerus, subsequent encounter for fracture with routine healing: Secondary | ICD-10-CM | POA: Diagnosis not present

## 2016-08-07 DIAGNOSIS — R41841 Cognitive communication deficit: Secondary | ICD-10-CM | POA: Diagnosis not present

## 2016-08-07 DIAGNOSIS — M6281 Muscle weakness (generalized): Secondary | ICD-10-CM | POA: Diagnosis not present

## 2016-08-07 DIAGNOSIS — R4189 Other symptoms and signs involving cognitive functions and awareness: Secondary | ICD-10-CM | POA: Diagnosis not present

## 2016-08-07 DIAGNOSIS — R279 Unspecified lack of coordination: Secondary | ICD-10-CM | POA: Diagnosis not present

## 2016-08-08 DIAGNOSIS — R279 Unspecified lack of coordination: Secondary | ICD-10-CM | POA: Diagnosis not present

## 2016-08-08 DIAGNOSIS — M84322A Stress fracture, left humerus, initial encounter for fracture: Secondary | ICD-10-CM | POA: Diagnosis not present

## 2016-08-08 DIAGNOSIS — R41841 Cognitive communication deficit: Secondary | ICD-10-CM | POA: Diagnosis not present

## 2016-08-08 DIAGNOSIS — I482 Chronic atrial fibrillation: Secondary | ICD-10-CM | POA: Diagnosis not present

## 2016-08-08 DIAGNOSIS — I10 Essential (primary) hypertension: Secondary | ICD-10-CM | POA: Diagnosis not present

## 2016-08-08 DIAGNOSIS — I638 Other cerebral infarction: Secondary | ICD-10-CM | POA: Diagnosis not present

## 2016-08-08 DIAGNOSIS — M84322D Stress fracture, left humerus, subsequent encounter for fracture with routine healing: Secondary | ICD-10-CM | POA: Diagnosis not present

## 2016-08-08 DIAGNOSIS — M6281 Muscle weakness (generalized): Secondary | ICD-10-CM | POA: Diagnosis not present

## 2016-08-08 DIAGNOSIS — N183 Chronic kidney disease, stage 3 (moderate): Secondary | ICD-10-CM | POA: Diagnosis not present

## 2016-08-08 DIAGNOSIS — R4189 Other symptoms and signs involving cognitive functions and awareness: Secondary | ICD-10-CM | POA: Diagnosis not present

## 2016-08-09 DIAGNOSIS — R4189 Other symptoms and signs involving cognitive functions and awareness: Secondary | ICD-10-CM | POA: Diagnosis not present

## 2016-08-09 DIAGNOSIS — R279 Unspecified lack of coordination: Secondary | ICD-10-CM | POA: Diagnosis not present

## 2016-08-09 DIAGNOSIS — R41841 Cognitive communication deficit: Secondary | ICD-10-CM | POA: Diagnosis not present

## 2016-08-09 DIAGNOSIS — M6281 Muscle weakness (generalized): Secondary | ICD-10-CM | POA: Diagnosis not present

## 2016-08-09 DIAGNOSIS — M84322D Stress fracture, left humerus, subsequent encounter for fracture with routine healing: Secondary | ICD-10-CM | POA: Diagnosis not present

## 2016-08-12 DIAGNOSIS — M6281 Muscle weakness (generalized): Secondary | ICD-10-CM | POA: Diagnosis not present

## 2016-08-12 DIAGNOSIS — R279 Unspecified lack of coordination: Secondary | ICD-10-CM | POA: Diagnosis not present

## 2016-08-12 DIAGNOSIS — R41841 Cognitive communication deficit: Secondary | ICD-10-CM | POA: Diagnosis not present

## 2016-08-12 DIAGNOSIS — R4189 Other symptoms and signs involving cognitive functions and awareness: Secondary | ICD-10-CM | POA: Diagnosis not present

## 2016-08-12 DIAGNOSIS — M84322D Stress fracture, left humerus, subsequent encounter for fracture with routine healing: Secondary | ICD-10-CM | POA: Diagnosis not present

## 2016-08-13 DIAGNOSIS — R4189 Other symptoms and signs involving cognitive functions and awareness: Secondary | ICD-10-CM | POA: Diagnosis not present

## 2016-08-13 DIAGNOSIS — M6281 Muscle weakness (generalized): Secondary | ICD-10-CM | POA: Diagnosis not present

## 2016-08-13 DIAGNOSIS — R279 Unspecified lack of coordination: Secondary | ICD-10-CM | POA: Diagnosis not present

## 2016-08-13 DIAGNOSIS — R41841 Cognitive communication deficit: Secondary | ICD-10-CM | POA: Diagnosis not present

## 2016-08-13 DIAGNOSIS — M84322D Stress fracture, left humerus, subsequent encounter for fracture with routine healing: Secondary | ICD-10-CM | POA: Diagnosis not present

## 2016-08-14 DIAGNOSIS — M6281 Muscle weakness (generalized): Secondary | ICD-10-CM | POA: Diagnosis not present

## 2016-08-14 DIAGNOSIS — Z5181 Encounter for therapeutic drug level monitoring: Secondary | ICD-10-CM | POA: Diagnosis not present

## 2016-08-14 DIAGNOSIS — R4189 Other symptoms and signs involving cognitive functions and awareness: Secondary | ICD-10-CM | POA: Diagnosis not present

## 2016-08-14 DIAGNOSIS — R279 Unspecified lack of coordination: Secondary | ICD-10-CM | POA: Diagnosis not present

## 2016-08-14 DIAGNOSIS — R41841 Cognitive communication deficit: Secondary | ICD-10-CM | POA: Diagnosis not present

## 2016-08-14 DIAGNOSIS — M84322D Stress fracture, left humerus, subsequent encounter for fracture with routine healing: Secondary | ICD-10-CM | POA: Diagnosis not present

## 2016-08-15 DIAGNOSIS — M84322D Stress fracture, left humerus, subsequent encounter for fracture with routine healing: Secondary | ICD-10-CM | POA: Diagnosis not present

## 2016-08-15 DIAGNOSIS — R41841 Cognitive communication deficit: Secondary | ICD-10-CM | POA: Diagnosis not present

## 2016-08-15 DIAGNOSIS — R279 Unspecified lack of coordination: Secondary | ICD-10-CM | POA: Diagnosis not present

## 2016-08-15 DIAGNOSIS — M6281 Muscle weakness (generalized): Secondary | ICD-10-CM | POA: Diagnosis not present

## 2016-08-15 DIAGNOSIS — R4189 Other symptoms and signs involving cognitive functions and awareness: Secondary | ICD-10-CM | POA: Diagnosis not present

## 2016-08-16 DIAGNOSIS — M79675 Pain in left toe(s): Secondary | ICD-10-CM | POA: Diagnosis not present

## 2016-08-16 DIAGNOSIS — B351 Tinea unguium: Secondary | ICD-10-CM | POA: Diagnosis not present

## 2016-08-16 DIAGNOSIS — R4189 Other symptoms and signs involving cognitive functions and awareness: Secondary | ICD-10-CM | POA: Diagnosis not present

## 2016-08-16 DIAGNOSIS — R41841 Cognitive communication deficit: Secondary | ICD-10-CM | POA: Diagnosis not present

## 2016-08-16 DIAGNOSIS — M84322D Stress fracture, left humerus, subsequent encounter for fracture with routine healing: Secondary | ICD-10-CM | POA: Diagnosis not present

## 2016-08-16 DIAGNOSIS — M6281 Muscle weakness (generalized): Secondary | ICD-10-CM | POA: Diagnosis not present

## 2016-08-16 DIAGNOSIS — R279 Unspecified lack of coordination: Secondary | ICD-10-CM | POA: Diagnosis not present

## 2016-08-19 DIAGNOSIS — R41841 Cognitive communication deficit: Secondary | ICD-10-CM | POA: Diagnosis not present

## 2016-08-19 DIAGNOSIS — R4189 Other symptoms and signs involving cognitive functions and awareness: Secondary | ICD-10-CM | POA: Diagnosis not present

## 2016-08-19 DIAGNOSIS — R279 Unspecified lack of coordination: Secondary | ICD-10-CM | POA: Diagnosis not present

## 2016-08-19 DIAGNOSIS — M6281 Muscle weakness (generalized): Secondary | ICD-10-CM | POA: Diagnosis not present

## 2016-08-19 DIAGNOSIS — M84322D Stress fracture, left humerus, subsequent encounter for fracture with routine healing: Secondary | ICD-10-CM | POA: Diagnosis not present

## 2016-08-20 DIAGNOSIS — M6281 Muscle weakness (generalized): Secondary | ICD-10-CM | POA: Diagnosis not present

## 2016-08-20 DIAGNOSIS — R4189 Other symptoms and signs involving cognitive functions and awareness: Secondary | ICD-10-CM | POA: Diagnosis not present

## 2016-08-20 DIAGNOSIS — M84322D Stress fracture, left humerus, subsequent encounter for fracture with routine healing: Secondary | ICD-10-CM | POA: Diagnosis not present

## 2016-08-20 DIAGNOSIS — R41841 Cognitive communication deficit: Secondary | ICD-10-CM | POA: Diagnosis not present

## 2016-08-20 DIAGNOSIS — R279 Unspecified lack of coordination: Secondary | ICD-10-CM | POA: Diagnosis not present

## 2016-08-21 DIAGNOSIS — M84322D Stress fracture, left humerus, subsequent encounter for fracture with routine healing: Secondary | ICD-10-CM | POA: Diagnosis not present

## 2016-08-21 DIAGNOSIS — R4189 Other symptoms and signs involving cognitive functions and awareness: Secondary | ICD-10-CM | POA: Diagnosis not present

## 2016-08-21 DIAGNOSIS — R41841 Cognitive communication deficit: Secondary | ICD-10-CM | POA: Diagnosis not present

## 2016-08-21 DIAGNOSIS — R279 Unspecified lack of coordination: Secondary | ICD-10-CM | POA: Diagnosis not present

## 2016-08-21 DIAGNOSIS — M6281 Muscle weakness (generalized): Secondary | ICD-10-CM | POA: Diagnosis not present

## 2016-08-22 DIAGNOSIS — M84322D Stress fracture, left humerus, subsequent encounter for fracture with routine healing: Secondary | ICD-10-CM | POA: Diagnosis not present

## 2016-08-22 DIAGNOSIS — R41841 Cognitive communication deficit: Secondary | ICD-10-CM | POA: Diagnosis not present

## 2016-08-22 DIAGNOSIS — R4189 Other symptoms and signs involving cognitive functions and awareness: Secondary | ICD-10-CM | POA: Diagnosis not present

## 2016-08-22 DIAGNOSIS — M6281 Muscle weakness (generalized): Secondary | ICD-10-CM | POA: Diagnosis not present

## 2016-08-22 DIAGNOSIS — R279 Unspecified lack of coordination: Secondary | ICD-10-CM | POA: Diagnosis not present

## 2016-08-23 DIAGNOSIS — M6281 Muscle weakness (generalized): Secondary | ICD-10-CM | POA: Diagnosis not present

## 2016-08-23 DIAGNOSIS — R4189 Other symptoms and signs involving cognitive functions and awareness: Secondary | ICD-10-CM | POA: Diagnosis not present

## 2016-08-23 DIAGNOSIS — R41841 Cognitive communication deficit: Secondary | ICD-10-CM | POA: Diagnosis not present

## 2016-08-23 DIAGNOSIS — M84322D Stress fracture, left humerus, subsequent encounter for fracture with routine healing: Secondary | ICD-10-CM | POA: Diagnosis not present

## 2016-08-23 DIAGNOSIS — R279 Unspecified lack of coordination: Secondary | ICD-10-CM | POA: Diagnosis not present

## 2016-08-26 DIAGNOSIS — M84322D Stress fracture, left humerus, subsequent encounter for fracture with routine healing: Secondary | ICD-10-CM | POA: Diagnosis not present

## 2016-08-26 DIAGNOSIS — R41841 Cognitive communication deficit: Secondary | ICD-10-CM | POA: Diagnosis not present

## 2016-08-26 DIAGNOSIS — R4189 Other symptoms and signs involving cognitive functions and awareness: Secondary | ICD-10-CM | POA: Diagnosis not present

## 2016-08-26 DIAGNOSIS — R279 Unspecified lack of coordination: Secondary | ICD-10-CM | POA: Diagnosis not present

## 2016-08-26 DIAGNOSIS — M6281 Muscle weakness (generalized): Secondary | ICD-10-CM | POA: Diagnosis not present

## 2016-08-27 DIAGNOSIS — R279 Unspecified lack of coordination: Secondary | ICD-10-CM | POA: Diagnosis not present

## 2016-08-27 DIAGNOSIS — M6281 Muscle weakness (generalized): Secondary | ICD-10-CM | POA: Diagnosis not present

## 2016-08-27 DIAGNOSIS — R4189 Other symptoms and signs involving cognitive functions and awareness: Secondary | ICD-10-CM | POA: Diagnosis not present

## 2016-08-27 DIAGNOSIS — R41841 Cognitive communication deficit: Secondary | ICD-10-CM | POA: Diagnosis not present

## 2016-08-27 DIAGNOSIS — M84322D Stress fracture, left humerus, subsequent encounter for fracture with routine healing: Secondary | ICD-10-CM | POA: Diagnosis not present

## 2016-08-28 DIAGNOSIS — R41841 Cognitive communication deficit: Secondary | ICD-10-CM | POA: Diagnosis not present

## 2016-08-28 DIAGNOSIS — R279 Unspecified lack of coordination: Secondary | ICD-10-CM | POA: Diagnosis not present

## 2016-08-28 DIAGNOSIS — R4189 Other symptoms and signs involving cognitive functions and awareness: Secondary | ICD-10-CM | POA: Diagnosis not present

## 2016-08-28 DIAGNOSIS — M84322D Stress fracture, left humerus, subsequent encounter for fracture with routine healing: Secondary | ICD-10-CM | POA: Diagnosis not present

## 2016-08-28 DIAGNOSIS — M6281 Muscle weakness (generalized): Secondary | ICD-10-CM | POA: Diagnosis not present

## 2016-08-29 DIAGNOSIS — M84322D Stress fracture, left humerus, subsequent encounter for fracture with routine healing: Secondary | ICD-10-CM | POA: Diagnosis not present

## 2016-08-29 DIAGNOSIS — M6281 Muscle weakness (generalized): Secondary | ICD-10-CM | POA: Diagnosis not present

## 2016-08-29 DIAGNOSIS — R41841 Cognitive communication deficit: Secondary | ICD-10-CM | POA: Diagnosis not present

## 2016-08-29 DIAGNOSIS — R279 Unspecified lack of coordination: Secondary | ICD-10-CM | POA: Diagnosis not present

## 2016-08-29 DIAGNOSIS — R4189 Other symptoms and signs involving cognitive functions and awareness: Secondary | ICD-10-CM | POA: Diagnosis not present

## 2016-08-30 DIAGNOSIS — M84322D Stress fracture, left humerus, subsequent encounter for fracture with routine healing: Secondary | ICD-10-CM | POA: Diagnosis not present

## 2016-08-30 DIAGNOSIS — R4189 Other symptoms and signs involving cognitive functions and awareness: Secondary | ICD-10-CM | POA: Diagnosis not present

## 2016-08-30 DIAGNOSIS — R279 Unspecified lack of coordination: Secondary | ICD-10-CM | POA: Diagnosis not present

## 2016-08-30 DIAGNOSIS — M6281 Muscle weakness (generalized): Secondary | ICD-10-CM | POA: Diagnosis not present

## 2016-08-30 DIAGNOSIS — R41841 Cognitive communication deficit: Secondary | ICD-10-CM | POA: Diagnosis not present

## 2016-08-31 DIAGNOSIS — M6281 Muscle weakness (generalized): Secondary | ICD-10-CM | POA: Diagnosis not present

## 2016-08-31 DIAGNOSIS — R4189 Other symptoms and signs involving cognitive functions and awareness: Secondary | ICD-10-CM | POA: Diagnosis not present

## 2016-08-31 DIAGNOSIS — R279 Unspecified lack of coordination: Secondary | ICD-10-CM | POA: Diagnosis not present

## 2016-08-31 DIAGNOSIS — R41841 Cognitive communication deficit: Secondary | ICD-10-CM | POA: Diagnosis not present

## 2016-08-31 DIAGNOSIS — M84322D Stress fracture, left humerus, subsequent encounter for fracture with routine healing: Secondary | ICD-10-CM | POA: Diagnosis not present

## 2016-09-02 DIAGNOSIS — M84322D Stress fracture, left humerus, subsequent encounter for fracture with routine healing: Secondary | ICD-10-CM | POA: Diagnosis not present

## 2016-09-02 DIAGNOSIS — R4189 Other symptoms and signs involving cognitive functions and awareness: Secondary | ICD-10-CM | POA: Diagnosis not present

## 2016-09-02 DIAGNOSIS — R279 Unspecified lack of coordination: Secondary | ICD-10-CM | POA: Diagnosis not present

## 2016-09-02 DIAGNOSIS — R41841 Cognitive communication deficit: Secondary | ICD-10-CM | POA: Diagnosis not present

## 2016-09-02 DIAGNOSIS — M6281 Muscle weakness (generalized): Secondary | ICD-10-CM | POA: Diagnosis not present

## 2016-09-03 DIAGNOSIS — R4189 Other symptoms and signs involving cognitive functions and awareness: Secondary | ICD-10-CM | POA: Diagnosis not present

## 2016-09-03 DIAGNOSIS — M6281 Muscle weakness (generalized): Secondary | ICD-10-CM | POA: Diagnosis not present

## 2016-09-03 DIAGNOSIS — R41841 Cognitive communication deficit: Secondary | ICD-10-CM | POA: Diagnosis not present

## 2016-09-03 DIAGNOSIS — R279 Unspecified lack of coordination: Secondary | ICD-10-CM | POA: Diagnosis not present

## 2016-09-03 DIAGNOSIS — M84322D Stress fracture, left humerus, subsequent encounter for fracture with routine healing: Secondary | ICD-10-CM | POA: Diagnosis not present

## 2016-09-04 DIAGNOSIS — R279 Unspecified lack of coordination: Secondary | ICD-10-CM | POA: Diagnosis not present

## 2016-09-04 DIAGNOSIS — R41841 Cognitive communication deficit: Secondary | ICD-10-CM | POA: Diagnosis not present

## 2016-09-04 DIAGNOSIS — M6281 Muscle weakness (generalized): Secondary | ICD-10-CM | POA: Diagnosis not present

## 2016-09-04 DIAGNOSIS — M84322D Stress fracture, left humerus, subsequent encounter for fracture with routine healing: Secondary | ICD-10-CM | POA: Diagnosis not present

## 2016-09-04 DIAGNOSIS — R4189 Other symptoms and signs involving cognitive functions and awareness: Secondary | ICD-10-CM | POA: Diagnosis not present

## 2016-09-05 DIAGNOSIS — R41841 Cognitive communication deficit: Secondary | ICD-10-CM | POA: Diagnosis not present

## 2016-09-05 DIAGNOSIS — M6281 Muscle weakness (generalized): Secondary | ICD-10-CM | POA: Diagnosis not present

## 2016-09-05 DIAGNOSIS — R4189 Other symptoms and signs involving cognitive functions and awareness: Secondary | ICD-10-CM | POA: Diagnosis not present

## 2016-09-05 DIAGNOSIS — M84322D Stress fracture, left humerus, subsequent encounter for fracture with routine healing: Secondary | ICD-10-CM | POA: Diagnosis not present

## 2016-09-05 DIAGNOSIS — R279 Unspecified lack of coordination: Secondary | ICD-10-CM | POA: Diagnosis not present

## 2016-09-06 DIAGNOSIS — M84322D Stress fracture, left humerus, subsequent encounter for fracture with routine healing: Secondary | ICD-10-CM | POA: Diagnosis not present

## 2016-09-06 DIAGNOSIS — R279 Unspecified lack of coordination: Secondary | ICD-10-CM | POA: Diagnosis not present

## 2016-09-06 DIAGNOSIS — M6281 Muscle weakness (generalized): Secondary | ICD-10-CM | POA: Diagnosis not present

## 2016-09-06 DIAGNOSIS — R4189 Other symptoms and signs involving cognitive functions and awareness: Secondary | ICD-10-CM | POA: Diagnosis not present

## 2016-09-06 DIAGNOSIS — R41841 Cognitive communication deficit: Secondary | ICD-10-CM | POA: Diagnosis not present

## 2016-09-09 DIAGNOSIS — R41841 Cognitive communication deficit: Secondary | ICD-10-CM | POA: Diagnosis not present

## 2016-09-09 DIAGNOSIS — M84322D Stress fracture, left humerus, subsequent encounter for fracture with routine healing: Secondary | ICD-10-CM | POA: Diagnosis not present

## 2016-09-09 DIAGNOSIS — R279 Unspecified lack of coordination: Secondary | ICD-10-CM | POA: Diagnosis not present

## 2016-09-09 DIAGNOSIS — R4189 Other symptoms and signs involving cognitive functions and awareness: Secondary | ICD-10-CM | POA: Diagnosis not present

## 2016-09-09 DIAGNOSIS — M6281 Muscle weakness (generalized): Secondary | ICD-10-CM | POA: Diagnosis not present

## 2016-09-11 DIAGNOSIS — R279 Unspecified lack of coordination: Secondary | ICD-10-CM | POA: Diagnosis not present

## 2016-09-11 DIAGNOSIS — M84322D Stress fracture, left humerus, subsequent encounter for fracture with routine healing: Secondary | ICD-10-CM | POA: Diagnosis not present

## 2016-09-11 DIAGNOSIS — M6281 Muscle weakness (generalized): Secondary | ICD-10-CM | POA: Diagnosis not present

## 2016-09-11 DIAGNOSIS — R41841 Cognitive communication deficit: Secondary | ICD-10-CM | POA: Diagnosis not present

## 2016-09-11 DIAGNOSIS — R4189 Other symptoms and signs involving cognitive functions and awareness: Secondary | ICD-10-CM | POA: Diagnosis not present

## 2016-09-23 DIAGNOSIS — I10 Essential (primary) hypertension: Secondary | ICD-10-CM | POA: Diagnosis not present

## 2016-09-23 DIAGNOSIS — N183 Chronic kidney disease, stage 3 (moderate): Secondary | ICD-10-CM | POA: Diagnosis not present

## 2016-09-23 DIAGNOSIS — I638 Other cerebral infarction: Secondary | ICD-10-CM | POA: Diagnosis not present

## 2016-09-23 DIAGNOSIS — I482 Chronic atrial fibrillation: Secondary | ICD-10-CM | POA: Diagnosis not present

## 2016-09-23 DIAGNOSIS — M84322A Stress fracture, left humerus, initial encounter for fracture: Secondary | ICD-10-CM | POA: Diagnosis not present

## 2016-11-07 DIAGNOSIS — I482 Chronic atrial fibrillation: Secondary | ICD-10-CM | POA: Diagnosis not present

## 2016-11-07 DIAGNOSIS — I10 Essential (primary) hypertension: Secondary | ICD-10-CM | POA: Diagnosis not present

## 2016-11-07 DIAGNOSIS — I638 Other cerebral infarction: Secondary | ICD-10-CM | POA: Diagnosis not present

## 2016-11-07 DIAGNOSIS — N183 Chronic kidney disease, stage 3 (moderate): Secondary | ICD-10-CM | POA: Diagnosis not present

## 2016-11-27 DIAGNOSIS — I10 Essential (primary) hypertension: Secondary | ICD-10-CM | POA: Diagnosis not present

## 2016-12-03 DIAGNOSIS — M79675 Pain in left toe(s): Secondary | ICD-10-CM | POA: Diagnosis not present

## 2016-12-03 DIAGNOSIS — B351 Tinea unguium: Secondary | ICD-10-CM | POA: Diagnosis not present

## 2016-12-23 DIAGNOSIS — I482 Chronic atrial fibrillation: Secondary | ICD-10-CM | POA: Diagnosis not present

## 2016-12-23 DIAGNOSIS — I10 Essential (primary) hypertension: Secondary | ICD-10-CM | POA: Diagnosis not present

## 2016-12-23 DIAGNOSIS — N183 Chronic kidney disease, stage 3 (moderate): Secondary | ICD-10-CM | POA: Diagnosis not present

## 2016-12-23 DIAGNOSIS — I638 Other cerebral infarction: Secondary | ICD-10-CM | POA: Diagnosis not present

## 2017-01-27 DIAGNOSIS — R4189 Other symptoms and signs involving cognitive functions and awareness: Secondary | ICD-10-CM | POA: Diagnosis not present

## 2017-01-27 DIAGNOSIS — R279 Unspecified lack of coordination: Secondary | ICD-10-CM | POA: Diagnosis not present

## 2017-01-27 DIAGNOSIS — R41841 Cognitive communication deficit: Secondary | ICD-10-CM | POA: Diagnosis not present

## 2017-01-27 DIAGNOSIS — M84322D Stress fracture, left humerus, subsequent encounter for fracture with routine healing: Secondary | ICD-10-CM | POA: Diagnosis not present

## 2017-01-27 DIAGNOSIS — M6281 Muscle weakness (generalized): Secondary | ICD-10-CM | POA: Diagnosis not present

## 2017-01-28 DIAGNOSIS — M6281 Muscle weakness (generalized): Secondary | ICD-10-CM | POA: Diagnosis not present

## 2017-01-28 DIAGNOSIS — R4189 Other symptoms and signs involving cognitive functions and awareness: Secondary | ICD-10-CM | POA: Diagnosis not present

## 2017-01-28 DIAGNOSIS — M84322D Stress fracture, left humerus, subsequent encounter for fracture with routine healing: Secondary | ICD-10-CM | POA: Diagnosis not present

## 2017-01-28 DIAGNOSIS — R41841 Cognitive communication deficit: Secondary | ICD-10-CM | POA: Diagnosis not present

## 2017-01-28 DIAGNOSIS — R279 Unspecified lack of coordination: Secondary | ICD-10-CM | POA: Diagnosis not present

## 2017-01-29 DIAGNOSIS — R279 Unspecified lack of coordination: Secondary | ICD-10-CM | POA: Diagnosis not present

## 2017-01-29 DIAGNOSIS — R41841 Cognitive communication deficit: Secondary | ICD-10-CM | POA: Diagnosis not present

## 2017-01-29 DIAGNOSIS — R4189 Other symptoms and signs involving cognitive functions and awareness: Secondary | ICD-10-CM | POA: Diagnosis not present

## 2017-01-29 DIAGNOSIS — M84322D Stress fracture, left humerus, subsequent encounter for fracture with routine healing: Secondary | ICD-10-CM | POA: Diagnosis not present

## 2017-01-29 DIAGNOSIS — M6281 Muscle weakness (generalized): Secondary | ICD-10-CM | POA: Diagnosis not present

## 2017-01-30 DIAGNOSIS — R4189 Other symptoms and signs involving cognitive functions and awareness: Secondary | ICD-10-CM | POA: Diagnosis not present

## 2017-01-30 DIAGNOSIS — M6281 Muscle weakness (generalized): Secondary | ICD-10-CM | POA: Diagnosis not present

## 2017-01-30 DIAGNOSIS — R41841 Cognitive communication deficit: Secondary | ICD-10-CM | POA: Diagnosis not present

## 2017-01-30 DIAGNOSIS — M84322D Stress fracture, left humerus, subsequent encounter for fracture with routine healing: Secondary | ICD-10-CM | POA: Diagnosis not present

## 2017-01-30 DIAGNOSIS — R279 Unspecified lack of coordination: Secondary | ICD-10-CM | POA: Diagnosis not present

## 2017-01-31 DIAGNOSIS — M84322D Stress fracture, left humerus, subsequent encounter for fracture with routine healing: Secondary | ICD-10-CM | POA: Diagnosis not present

## 2017-01-31 DIAGNOSIS — R41841 Cognitive communication deficit: Secondary | ICD-10-CM | POA: Diagnosis not present

## 2017-01-31 DIAGNOSIS — R279 Unspecified lack of coordination: Secondary | ICD-10-CM | POA: Diagnosis not present

## 2017-01-31 DIAGNOSIS — R4189 Other symptoms and signs involving cognitive functions and awareness: Secondary | ICD-10-CM | POA: Diagnosis not present

## 2017-01-31 DIAGNOSIS — M6281 Muscle weakness (generalized): Secondary | ICD-10-CM | POA: Diagnosis not present

## 2017-02-03 DIAGNOSIS — M84322D Stress fracture, left humerus, subsequent encounter for fracture with routine healing: Secondary | ICD-10-CM | POA: Diagnosis not present

## 2017-02-03 DIAGNOSIS — R279 Unspecified lack of coordination: Secondary | ICD-10-CM | POA: Diagnosis not present

## 2017-02-03 DIAGNOSIS — R41841 Cognitive communication deficit: Secondary | ICD-10-CM | POA: Diagnosis not present

## 2017-02-03 DIAGNOSIS — M6281 Muscle weakness (generalized): Secondary | ICD-10-CM | POA: Diagnosis not present

## 2017-02-03 DIAGNOSIS — R4189 Other symptoms and signs involving cognitive functions and awareness: Secondary | ICD-10-CM | POA: Diagnosis not present

## 2017-02-04 DIAGNOSIS — M6281 Muscle weakness (generalized): Secondary | ICD-10-CM | POA: Diagnosis not present

## 2017-02-04 DIAGNOSIS — R279 Unspecified lack of coordination: Secondary | ICD-10-CM | POA: Diagnosis not present

## 2017-02-04 DIAGNOSIS — M84322D Stress fracture, left humerus, subsequent encounter for fracture with routine healing: Secondary | ICD-10-CM | POA: Diagnosis not present

## 2017-02-04 DIAGNOSIS — R41841 Cognitive communication deficit: Secondary | ICD-10-CM | POA: Diagnosis not present

## 2017-02-04 DIAGNOSIS — R4189 Other symptoms and signs involving cognitive functions and awareness: Secondary | ICD-10-CM | POA: Diagnosis not present

## 2017-02-05 DIAGNOSIS — R41841 Cognitive communication deficit: Secondary | ICD-10-CM | POA: Diagnosis not present

## 2017-02-05 DIAGNOSIS — M6281 Muscle weakness (generalized): Secondary | ICD-10-CM | POA: Diagnosis not present

## 2017-02-05 DIAGNOSIS — R279 Unspecified lack of coordination: Secondary | ICD-10-CM | POA: Diagnosis not present

## 2017-02-05 DIAGNOSIS — R4189 Other symptoms and signs involving cognitive functions and awareness: Secondary | ICD-10-CM | POA: Diagnosis not present

## 2017-02-05 DIAGNOSIS — M84322D Stress fracture, left humerus, subsequent encounter for fracture with routine healing: Secondary | ICD-10-CM | POA: Diagnosis not present

## 2017-02-06 DIAGNOSIS — R4189 Other symptoms and signs involving cognitive functions and awareness: Secondary | ICD-10-CM | POA: Diagnosis not present

## 2017-02-06 DIAGNOSIS — M6281 Muscle weakness (generalized): Secondary | ICD-10-CM | POA: Diagnosis not present

## 2017-02-06 DIAGNOSIS — M84322D Stress fracture, left humerus, subsequent encounter for fracture with routine healing: Secondary | ICD-10-CM | POA: Diagnosis not present

## 2017-02-06 DIAGNOSIS — R279 Unspecified lack of coordination: Secondary | ICD-10-CM | POA: Diagnosis not present

## 2017-02-06 DIAGNOSIS — R41841 Cognitive communication deficit: Secondary | ICD-10-CM | POA: Diagnosis not present

## 2017-02-07 DIAGNOSIS — R4189 Other symptoms and signs involving cognitive functions and awareness: Secondary | ICD-10-CM | POA: Diagnosis not present

## 2017-02-07 DIAGNOSIS — M6281 Muscle weakness (generalized): Secondary | ICD-10-CM | POA: Diagnosis not present

## 2017-02-07 DIAGNOSIS — M84322D Stress fracture, left humerus, subsequent encounter for fracture with routine healing: Secondary | ICD-10-CM | POA: Diagnosis not present

## 2017-02-07 DIAGNOSIS — R41841 Cognitive communication deficit: Secondary | ICD-10-CM | POA: Diagnosis not present

## 2017-02-07 DIAGNOSIS — R279 Unspecified lack of coordination: Secondary | ICD-10-CM | POA: Diagnosis not present

## 2017-02-10 DIAGNOSIS — R4189 Other symptoms and signs involving cognitive functions and awareness: Secondary | ICD-10-CM | POA: Diagnosis not present

## 2017-02-10 DIAGNOSIS — M84322D Stress fracture, left humerus, subsequent encounter for fracture with routine healing: Secondary | ICD-10-CM | POA: Diagnosis not present

## 2017-02-10 DIAGNOSIS — R41841 Cognitive communication deficit: Secondary | ICD-10-CM | POA: Diagnosis not present

## 2017-02-10 DIAGNOSIS — R279 Unspecified lack of coordination: Secondary | ICD-10-CM | POA: Diagnosis not present

## 2017-02-10 DIAGNOSIS — M6281 Muscle weakness (generalized): Secondary | ICD-10-CM | POA: Diagnosis not present

## 2017-02-11 DIAGNOSIS — R41841 Cognitive communication deficit: Secondary | ICD-10-CM | POA: Diagnosis not present

## 2017-02-11 DIAGNOSIS — R4189 Other symptoms and signs involving cognitive functions and awareness: Secondary | ICD-10-CM | POA: Diagnosis not present

## 2017-02-11 DIAGNOSIS — R279 Unspecified lack of coordination: Secondary | ICD-10-CM | POA: Diagnosis not present

## 2017-02-11 DIAGNOSIS — M6281 Muscle weakness (generalized): Secondary | ICD-10-CM | POA: Diagnosis not present

## 2017-02-11 DIAGNOSIS — M84322D Stress fracture, left humerus, subsequent encounter for fracture with routine healing: Secondary | ICD-10-CM | POA: Diagnosis not present

## 2017-02-12 DIAGNOSIS — R41841 Cognitive communication deficit: Secondary | ICD-10-CM | POA: Diagnosis not present

## 2017-02-12 DIAGNOSIS — R4189 Other symptoms and signs involving cognitive functions and awareness: Secondary | ICD-10-CM | POA: Diagnosis not present

## 2017-02-12 DIAGNOSIS — M6281 Muscle weakness (generalized): Secondary | ICD-10-CM | POA: Diagnosis not present

## 2017-02-12 DIAGNOSIS — R279 Unspecified lack of coordination: Secondary | ICD-10-CM | POA: Diagnosis not present

## 2017-02-12 DIAGNOSIS — M84322D Stress fracture, left humerus, subsequent encounter for fracture with routine healing: Secondary | ICD-10-CM | POA: Diagnosis not present

## 2017-02-13 DIAGNOSIS — R279 Unspecified lack of coordination: Secondary | ICD-10-CM | POA: Diagnosis not present

## 2017-02-13 DIAGNOSIS — I10 Essential (primary) hypertension: Secondary | ICD-10-CM | POA: Diagnosis not present

## 2017-02-13 DIAGNOSIS — R4182 Altered mental status, unspecified: Secondary | ICD-10-CM | POA: Diagnosis not present

## 2017-02-13 DIAGNOSIS — R269 Unspecified abnormalities of gait and mobility: Secondary | ICD-10-CM | POA: Diagnosis not present

## 2017-02-13 DIAGNOSIS — I4891 Unspecified atrial fibrillation: Secondary | ICD-10-CM | POA: Diagnosis not present

## 2017-02-13 DIAGNOSIS — M84322D Stress fracture, left humerus, subsequent encounter for fracture with routine healing: Secondary | ICD-10-CM | POA: Diagnosis not present

## 2017-02-13 DIAGNOSIS — R41841 Cognitive communication deficit: Secondary | ICD-10-CM | POA: Diagnosis not present

## 2017-02-13 DIAGNOSIS — R4189 Other symptoms and signs involving cognitive functions and awareness: Secondary | ICD-10-CM | POA: Diagnosis not present

## 2017-02-13 DIAGNOSIS — M6281 Muscle weakness (generalized): Secondary | ICD-10-CM | POA: Diagnosis not present

## 2017-02-14 DIAGNOSIS — R4189 Other symptoms and signs involving cognitive functions and awareness: Secondary | ICD-10-CM | POA: Diagnosis not present

## 2017-02-14 DIAGNOSIS — M84322D Stress fracture, left humerus, subsequent encounter for fracture with routine healing: Secondary | ICD-10-CM | POA: Diagnosis not present

## 2017-02-14 DIAGNOSIS — R279 Unspecified lack of coordination: Secondary | ICD-10-CM | POA: Diagnosis not present

## 2017-02-14 DIAGNOSIS — M6281 Muscle weakness (generalized): Secondary | ICD-10-CM | POA: Diagnosis not present

## 2017-02-14 DIAGNOSIS — R41841 Cognitive communication deficit: Secondary | ICD-10-CM | POA: Diagnosis not present

## 2017-02-17 DIAGNOSIS — M84322D Stress fracture, left humerus, subsequent encounter for fracture with routine healing: Secondary | ICD-10-CM | POA: Diagnosis not present

## 2017-02-17 DIAGNOSIS — R279 Unspecified lack of coordination: Secondary | ICD-10-CM | POA: Diagnosis not present

## 2017-02-17 DIAGNOSIS — R4189 Other symptoms and signs involving cognitive functions and awareness: Secondary | ICD-10-CM | POA: Diagnosis not present

## 2017-02-17 DIAGNOSIS — R41841 Cognitive communication deficit: Secondary | ICD-10-CM | POA: Diagnosis not present

## 2017-02-17 DIAGNOSIS — M6281 Muscle weakness (generalized): Secondary | ICD-10-CM | POA: Diagnosis not present

## 2017-02-18 DIAGNOSIS — R4189 Other symptoms and signs involving cognitive functions and awareness: Secondary | ICD-10-CM | POA: Diagnosis not present

## 2017-02-18 DIAGNOSIS — M84322D Stress fracture, left humerus, subsequent encounter for fracture with routine healing: Secondary | ICD-10-CM | POA: Diagnosis not present

## 2017-02-18 DIAGNOSIS — R279 Unspecified lack of coordination: Secondary | ICD-10-CM | POA: Diagnosis not present

## 2017-02-18 DIAGNOSIS — R41841 Cognitive communication deficit: Secondary | ICD-10-CM | POA: Diagnosis not present

## 2017-02-18 DIAGNOSIS — M6281 Muscle weakness (generalized): Secondary | ICD-10-CM | POA: Diagnosis not present

## 2017-02-19 DIAGNOSIS — R4189 Other symptoms and signs involving cognitive functions and awareness: Secondary | ICD-10-CM | POA: Diagnosis not present

## 2017-02-19 DIAGNOSIS — R279 Unspecified lack of coordination: Secondary | ICD-10-CM | POA: Diagnosis not present

## 2017-02-19 DIAGNOSIS — M84322D Stress fracture, left humerus, subsequent encounter for fracture with routine healing: Secondary | ICD-10-CM | POA: Diagnosis not present

## 2017-02-19 DIAGNOSIS — R41841 Cognitive communication deficit: Secondary | ICD-10-CM | POA: Diagnosis not present

## 2017-02-19 DIAGNOSIS — M6281 Muscle weakness (generalized): Secondary | ICD-10-CM | POA: Diagnosis not present

## 2017-02-20 DIAGNOSIS — R41841 Cognitive communication deficit: Secondary | ICD-10-CM | POA: Diagnosis not present

## 2017-02-20 DIAGNOSIS — R4189 Other symptoms and signs involving cognitive functions and awareness: Secondary | ICD-10-CM | POA: Diagnosis not present

## 2017-02-20 DIAGNOSIS — M84322D Stress fracture, left humerus, subsequent encounter for fracture with routine healing: Secondary | ICD-10-CM | POA: Diagnosis not present

## 2017-02-20 DIAGNOSIS — M6281 Muscle weakness (generalized): Secondary | ICD-10-CM | POA: Diagnosis not present

## 2017-02-20 DIAGNOSIS — R279 Unspecified lack of coordination: Secondary | ICD-10-CM | POA: Diagnosis not present

## 2017-02-21 DIAGNOSIS — R279 Unspecified lack of coordination: Secondary | ICD-10-CM | POA: Diagnosis not present

## 2017-02-21 DIAGNOSIS — R41841 Cognitive communication deficit: Secondary | ICD-10-CM | POA: Diagnosis not present

## 2017-02-21 DIAGNOSIS — M6281 Muscle weakness (generalized): Secondary | ICD-10-CM | POA: Diagnosis not present

## 2017-02-21 DIAGNOSIS — R4189 Other symptoms and signs involving cognitive functions and awareness: Secondary | ICD-10-CM | POA: Diagnosis not present

## 2017-02-21 DIAGNOSIS — M84322D Stress fracture, left humerus, subsequent encounter for fracture with routine healing: Secondary | ICD-10-CM | POA: Diagnosis not present

## 2017-02-24 DIAGNOSIS — R4189 Other symptoms and signs involving cognitive functions and awareness: Secondary | ICD-10-CM | POA: Diagnosis not present

## 2017-02-24 DIAGNOSIS — M84322D Stress fracture, left humerus, subsequent encounter for fracture with routine healing: Secondary | ICD-10-CM | POA: Diagnosis not present

## 2017-02-24 DIAGNOSIS — M6281 Muscle weakness (generalized): Secondary | ICD-10-CM | POA: Diagnosis not present

## 2017-02-24 DIAGNOSIS — R279 Unspecified lack of coordination: Secondary | ICD-10-CM | POA: Diagnosis not present

## 2017-02-24 DIAGNOSIS — R41841 Cognitive communication deficit: Secondary | ICD-10-CM | POA: Diagnosis not present

## 2017-02-25 DIAGNOSIS — R4189 Other symptoms and signs involving cognitive functions and awareness: Secondary | ICD-10-CM | POA: Diagnosis not present

## 2017-02-25 DIAGNOSIS — M84322D Stress fracture, left humerus, subsequent encounter for fracture with routine healing: Secondary | ICD-10-CM | POA: Diagnosis not present

## 2017-02-25 DIAGNOSIS — M6281 Muscle weakness (generalized): Secondary | ICD-10-CM | POA: Diagnosis not present

## 2017-02-25 DIAGNOSIS — R41841 Cognitive communication deficit: Secondary | ICD-10-CM | POA: Diagnosis not present

## 2017-02-25 DIAGNOSIS — R279 Unspecified lack of coordination: Secondary | ICD-10-CM | POA: Diagnosis not present

## 2017-03-13 DIAGNOSIS — R269 Unspecified abnormalities of gait and mobility: Secondary | ICD-10-CM | POA: Diagnosis not present

## 2017-03-13 DIAGNOSIS — R296 Repeated falls: Secondary | ICD-10-CM | POA: Diagnosis not present

## 2017-03-13 DIAGNOSIS — R4182 Altered mental status, unspecified: Secondary | ICD-10-CM | POA: Diagnosis not present

## 2017-03-13 DIAGNOSIS — I10 Essential (primary) hypertension: Secondary | ICD-10-CM | POA: Diagnosis not present

## 2017-04-09 DIAGNOSIS — I6930 Unspecified sequelae of cerebral infarction: Secondary | ICD-10-CM | POA: Diagnosis not present

## 2017-04-09 DIAGNOSIS — I10 Essential (primary) hypertension: Secondary | ICD-10-CM | POA: Diagnosis not present

## 2017-04-09 DIAGNOSIS — N3281 Overactive bladder: Secondary | ICD-10-CM | POA: Diagnosis not present

## 2017-04-09 DIAGNOSIS — I4891 Unspecified atrial fibrillation: Secondary | ICD-10-CM | POA: Diagnosis not present

## 2017-05-06 DIAGNOSIS — E119 Type 2 diabetes mellitus without complications: Secondary | ICD-10-CM | POA: Diagnosis not present

## 2017-05-06 DIAGNOSIS — E782 Mixed hyperlipidemia: Secondary | ICD-10-CM | POA: Diagnosis not present

## 2017-05-06 DIAGNOSIS — Z79899 Other long term (current) drug therapy: Secondary | ICD-10-CM | POA: Diagnosis not present

## 2017-05-06 DIAGNOSIS — D518 Other vitamin B12 deficiency anemias: Secondary | ICD-10-CM | POA: Diagnosis not present

## 2017-05-07 DIAGNOSIS — I10 Essential (primary) hypertension: Secondary | ICD-10-CM | POA: Diagnosis not present

## 2017-05-07 DIAGNOSIS — R269 Unspecified abnormalities of gait and mobility: Secondary | ICD-10-CM | POA: Diagnosis not present

## 2017-05-07 DIAGNOSIS — R296 Repeated falls: Secondary | ICD-10-CM | POA: Diagnosis not present

## 2017-05-07 DIAGNOSIS — M84322D Stress fracture, left humerus, subsequent encounter for fracture with routine healing: Secondary | ICD-10-CM | POA: Diagnosis not present

## 2017-05-13 DIAGNOSIS — M21612 Bunion of left foot: Secondary | ICD-10-CM | POA: Diagnosis not present

## 2017-05-13 DIAGNOSIS — B351 Tinea unguium: Secondary | ICD-10-CM | POA: Diagnosis not present

## 2017-05-13 DIAGNOSIS — L84 Corns and callosities: Secondary | ICD-10-CM | POA: Diagnosis not present

## 2017-05-22 DIAGNOSIS — R4189 Other symptoms and signs involving cognitive functions and awareness: Secondary | ICD-10-CM | POA: Diagnosis not present

## 2017-05-22 DIAGNOSIS — M6281 Muscle weakness (generalized): Secondary | ICD-10-CM | POA: Diagnosis not present

## 2017-05-22 DIAGNOSIS — R41841 Cognitive communication deficit: Secondary | ICD-10-CM | POA: Diagnosis not present

## 2017-05-22 DIAGNOSIS — R279 Unspecified lack of coordination: Secondary | ICD-10-CM | POA: Diagnosis not present

## 2017-05-22 DIAGNOSIS — M84322D Stress fracture, left humerus, subsequent encounter for fracture with routine healing: Secondary | ICD-10-CM | POA: Diagnosis not present

## 2017-05-23 DIAGNOSIS — M6281 Muscle weakness (generalized): Secondary | ICD-10-CM | POA: Diagnosis not present

## 2017-05-23 DIAGNOSIS — M84322D Stress fracture, left humerus, subsequent encounter for fracture with routine healing: Secondary | ICD-10-CM | POA: Diagnosis not present

## 2017-05-23 DIAGNOSIS — R41841 Cognitive communication deficit: Secondary | ICD-10-CM | POA: Diagnosis not present

## 2017-05-23 DIAGNOSIS — R279 Unspecified lack of coordination: Secondary | ICD-10-CM | POA: Diagnosis not present

## 2017-05-23 DIAGNOSIS — R4189 Other symptoms and signs involving cognitive functions and awareness: Secondary | ICD-10-CM | POA: Diagnosis not present

## 2017-05-26 DIAGNOSIS — M84322D Stress fracture, left humerus, subsequent encounter for fracture with routine healing: Secondary | ICD-10-CM | POA: Diagnosis not present

## 2017-05-26 DIAGNOSIS — M6281 Muscle weakness (generalized): Secondary | ICD-10-CM | POA: Diagnosis not present

## 2017-05-26 DIAGNOSIS — R41841 Cognitive communication deficit: Secondary | ICD-10-CM | POA: Diagnosis not present

## 2017-05-26 DIAGNOSIS — R4189 Other symptoms and signs involving cognitive functions and awareness: Secondary | ICD-10-CM | POA: Diagnosis not present

## 2017-05-26 DIAGNOSIS — R279 Unspecified lack of coordination: Secondary | ICD-10-CM | POA: Diagnosis not present

## 2017-05-29 DIAGNOSIS — I10 Essential (primary) hypertension: Secondary | ICD-10-CM | POA: Diagnosis not present

## 2017-05-29 DIAGNOSIS — M6281 Muscle weakness (generalized): Secondary | ICD-10-CM | POA: Diagnosis not present

## 2017-05-29 DIAGNOSIS — G9340 Encephalopathy, unspecified: Secondary | ICD-10-CM | POA: Diagnosis not present

## 2017-05-29 DIAGNOSIS — R2689 Other abnormalities of gait and mobility: Secondary | ICD-10-CM | POA: Diagnosis not present

## 2017-05-29 DIAGNOSIS — N189 Chronic kidney disease, unspecified: Secondary | ICD-10-CM | POA: Diagnosis not present

## 2017-06-02 DIAGNOSIS — N189 Chronic kidney disease, unspecified: Secondary | ICD-10-CM | POA: Diagnosis not present

## 2017-06-02 DIAGNOSIS — G9340 Encephalopathy, unspecified: Secondary | ICD-10-CM | POA: Diagnosis not present

## 2017-06-02 DIAGNOSIS — M6281 Muscle weakness (generalized): Secondary | ICD-10-CM | POA: Diagnosis not present

## 2017-06-02 DIAGNOSIS — I10 Essential (primary) hypertension: Secondary | ICD-10-CM | POA: Diagnosis not present

## 2017-06-02 DIAGNOSIS — R2689 Other abnormalities of gait and mobility: Secondary | ICD-10-CM | POA: Diagnosis not present

## 2017-06-04 DIAGNOSIS — M6281 Muscle weakness (generalized): Secondary | ICD-10-CM | POA: Diagnosis not present

## 2017-06-04 DIAGNOSIS — G9340 Encephalopathy, unspecified: Secondary | ICD-10-CM | POA: Diagnosis not present

## 2017-06-04 DIAGNOSIS — R2689 Other abnormalities of gait and mobility: Secondary | ICD-10-CM | POA: Diagnosis not present

## 2017-06-04 DIAGNOSIS — N189 Chronic kidney disease, unspecified: Secondary | ICD-10-CM | POA: Diagnosis not present

## 2017-06-04 DIAGNOSIS — I10 Essential (primary) hypertension: Secondary | ICD-10-CM | POA: Diagnosis not present

## 2017-06-07 DIAGNOSIS — N189 Chronic kidney disease, unspecified: Secondary | ICD-10-CM | POA: Diagnosis not present

## 2017-06-07 DIAGNOSIS — R2689 Other abnormalities of gait and mobility: Secondary | ICD-10-CM | POA: Diagnosis not present

## 2017-06-07 DIAGNOSIS — G9340 Encephalopathy, unspecified: Secondary | ICD-10-CM | POA: Diagnosis not present

## 2017-06-07 DIAGNOSIS — M6281 Muscle weakness (generalized): Secondary | ICD-10-CM | POA: Diagnosis not present

## 2017-06-07 DIAGNOSIS — I10 Essential (primary) hypertension: Secondary | ICD-10-CM | POA: Diagnosis not present

## 2017-06-08 DIAGNOSIS — I6930 Unspecified sequelae of cerebral infarction: Secondary | ICD-10-CM | POA: Diagnosis not present

## 2017-06-08 DIAGNOSIS — I10 Essential (primary) hypertension: Secondary | ICD-10-CM | POA: Diagnosis not present

## 2017-06-08 DIAGNOSIS — R4182 Altered mental status, unspecified: Secondary | ICD-10-CM | POA: Diagnosis not present

## 2017-06-08 DIAGNOSIS — R269 Unspecified abnormalities of gait and mobility: Secondary | ICD-10-CM | POA: Diagnosis not present

## 2017-06-09 DIAGNOSIS — R2689 Other abnormalities of gait and mobility: Secondary | ICD-10-CM | POA: Diagnosis not present

## 2017-06-09 DIAGNOSIS — M6281 Muscle weakness (generalized): Secondary | ICD-10-CM | POA: Diagnosis not present

## 2017-06-09 DIAGNOSIS — H353131 Nonexudative age-related macular degeneration, bilateral, early dry stage: Secondary | ICD-10-CM | POA: Diagnosis not present

## 2017-06-09 DIAGNOSIS — I10 Essential (primary) hypertension: Secondary | ICD-10-CM | POA: Diagnosis not present

## 2017-06-09 DIAGNOSIS — H26493 Other secondary cataract, bilateral: Secondary | ICD-10-CM | POA: Diagnosis not present

## 2017-06-09 DIAGNOSIS — G9340 Encephalopathy, unspecified: Secondary | ICD-10-CM | POA: Diagnosis not present

## 2017-06-09 DIAGNOSIS — Z961 Presence of intraocular lens: Secondary | ICD-10-CM | POA: Diagnosis not present

## 2017-06-09 DIAGNOSIS — N189 Chronic kidney disease, unspecified: Secondary | ICD-10-CM | POA: Diagnosis not present

## 2017-06-10 DIAGNOSIS — M6281 Muscle weakness (generalized): Secondary | ICD-10-CM | POA: Diagnosis not present

## 2017-06-10 DIAGNOSIS — G9340 Encephalopathy, unspecified: Secondary | ICD-10-CM | POA: Diagnosis not present

## 2017-06-10 DIAGNOSIS — R2689 Other abnormalities of gait and mobility: Secondary | ICD-10-CM | POA: Diagnosis not present

## 2017-06-10 DIAGNOSIS — I10 Essential (primary) hypertension: Secondary | ICD-10-CM | POA: Diagnosis not present

## 2017-06-10 DIAGNOSIS — N189 Chronic kidney disease, unspecified: Secondary | ICD-10-CM | POA: Diagnosis not present

## 2017-06-12 DIAGNOSIS — I10 Essential (primary) hypertension: Secondary | ICD-10-CM | POA: Diagnosis not present

## 2017-06-12 DIAGNOSIS — N189 Chronic kidney disease, unspecified: Secondary | ICD-10-CM | POA: Diagnosis not present

## 2017-06-12 DIAGNOSIS — R2689 Other abnormalities of gait and mobility: Secondary | ICD-10-CM | POA: Diagnosis not present

## 2017-06-12 DIAGNOSIS — G9340 Encephalopathy, unspecified: Secondary | ICD-10-CM | POA: Diagnosis not present

## 2017-06-12 DIAGNOSIS — M6281 Muscle weakness (generalized): Secondary | ICD-10-CM | POA: Diagnosis not present

## 2017-06-13 DIAGNOSIS — N189 Chronic kidney disease, unspecified: Secondary | ICD-10-CM | POA: Diagnosis not present

## 2017-06-13 DIAGNOSIS — I10 Essential (primary) hypertension: Secondary | ICD-10-CM | POA: Diagnosis not present

## 2017-06-13 DIAGNOSIS — R2689 Other abnormalities of gait and mobility: Secondary | ICD-10-CM | POA: Diagnosis not present

## 2017-06-13 DIAGNOSIS — G9340 Encephalopathy, unspecified: Secondary | ICD-10-CM | POA: Diagnosis not present

## 2017-06-13 DIAGNOSIS — M6281 Muscle weakness (generalized): Secondary | ICD-10-CM | POA: Diagnosis not present

## 2017-06-18 DIAGNOSIS — N189 Chronic kidney disease, unspecified: Secondary | ICD-10-CM | POA: Diagnosis not present

## 2017-06-18 DIAGNOSIS — I10 Essential (primary) hypertension: Secondary | ICD-10-CM | POA: Diagnosis not present

## 2017-06-18 DIAGNOSIS — M6281 Muscle weakness (generalized): Secondary | ICD-10-CM | POA: Diagnosis not present

## 2017-06-18 DIAGNOSIS — G9340 Encephalopathy, unspecified: Secondary | ICD-10-CM | POA: Diagnosis not present

## 2017-06-18 DIAGNOSIS — R2689 Other abnormalities of gait and mobility: Secondary | ICD-10-CM | POA: Diagnosis not present

## 2017-06-22 DIAGNOSIS — M79652 Pain in left thigh: Secondary | ICD-10-CM | POA: Diagnosis not present

## 2017-06-22 DIAGNOSIS — Z96652 Presence of left artificial knee joint: Secondary | ICD-10-CM | POA: Diagnosis not present

## 2017-06-22 DIAGNOSIS — M1612 Unilateral primary osteoarthritis, left hip: Secondary | ICD-10-CM | POA: Diagnosis not present

## 2017-06-22 DIAGNOSIS — M25552 Pain in left hip: Secondary | ICD-10-CM | POA: Diagnosis not present

## 2017-06-23 DIAGNOSIS — F329 Major depressive disorder, single episode, unspecified: Secondary | ICD-10-CM | POA: Diagnosis not present

## 2017-07-25 DIAGNOSIS — R296 Repeated falls: Secondary | ICD-10-CM | POA: Diagnosis not present

## 2017-07-25 DIAGNOSIS — I4891 Unspecified atrial fibrillation: Secondary | ICD-10-CM | POA: Diagnosis not present

## 2017-07-25 DIAGNOSIS — N3281 Overactive bladder: Secondary | ICD-10-CM | POA: Diagnosis not present

## 2017-07-25 DIAGNOSIS — I10 Essential (primary) hypertension: Secondary | ICD-10-CM | POA: Diagnosis not present

## 2017-07-31 ENCOUNTER — Telehealth: Payer: Self-pay | Admitting: Family Medicine

## 2017-08-04 NOTE — Telephone Encounter (Signed)
Social worker handled for patient

## 2017-08-11 DIAGNOSIS — Z79899 Other long term (current) drug therapy: Secondary | ICD-10-CM | POA: Diagnosis not present

## 2017-08-16 DIAGNOSIS — L84 Corns and callosities: Secondary | ICD-10-CM | POA: Diagnosis not present

## 2017-08-16 DIAGNOSIS — B351 Tinea unguium: Secondary | ICD-10-CM | POA: Diagnosis not present

## 2017-08-21 DIAGNOSIS — I4891 Unspecified atrial fibrillation: Secondary | ICD-10-CM | POA: Diagnosis not present

## 2017-08-21 DIAGNOSIS — N3281 Overactive bladder: Secondary | ICD-10-CM | POA: Diagnosis not present

## 2017-08-21 DIAGNOSIS — I10 Essential (primary) hypertension: Secondary | ICD-10-CM | POA: Diagnosis not present

## 2017-08-21 DIAGNOSIS — F329 Major depressive disorder, single episode, unspecified: Secondary | ICD-10-CM | POA: Diagnosis not present

## 2017-09-04 DIAGNOSIS — G9340 Encephalopathy, unspecified: Secondary | ICD-10-CM | POA: Diagnosis not present

## 2017-09-04 DIAGNOSIS — I10 Essential (primary) hypertension: Secondary | ICD-10-CM | POA: Diagnosis not present

## 2017-09-04 DIAGNOSIS — M6281 Muscle weakness (generalized): Secondary | ICD-10-CM | POA: Diagnosis not present

## 2017-09-04 DIAGNOSIS — R2689 Other abnormalities of gait and mobility: Secondary | ICD-10-CM | POA: Diagnosis not present

## 2017-09-04 DIAGNOSIS — N189 Chronic kidney disease, unspecified: Secondary | ICD-10-CM | POA: Diagnosis not present

## 2017-09-05 DIAGNOSIS — N189 Chronic kidney disease, unspecified: Secondary | ICD-10-CM | POA: Diagnosis not present

## 2017-09-05 DIAGNOSIS — I10 Essential (primary) hypertension: Secondary | ICD-10-CM | POA: Diagnosis not present

## 2017-09-05 DIAGNOSIS — R2689 Other abnormalities of gait and mobility: Secondary | ICD-10-CM | POA: Diagnosis not present

## 2017-09-05 DIAGNOSIS — G9340 Encephalopathy, unspecified: Secondary | ICD-10-CM | POA: Diagnosis not present

## 2017-09-05 DIAGNOSIS — M6281 Muscle weakness (generalized): Secondary | ICD-10-CM | POA: Diagnosis not present

## 2017-09-07 DIAGNOSIS — M6281 Muscle weakness (generalized): Secondary | ICD-10-CM | POA: Diagnosis not present

## 2017-09-07 DIAGNOSIS — I10 Essential (primary) hypertension: Secondary | ICD-10-CM | POA: Diagnosis not present

## 2017-09-07 DIAGNOSIS — R2689 Other abnormalities of gait and mobility: Secondary | ICD-10-CM | POA: Diagnosis not present

## 2017-09-07 DIAGNOSIS — G9340 Encephalopathy, unspecified: Secondary | ICD-10-CM | POA: Diagnosis not present

## 2017-09-07 DIAGNOSIS — N189 Chronic kidney disease, unspecified: Secondary | ICD-10-CM | POA: Diagnosis not present

## 2017-09-08 DIAGNOSIS — R2689 Other abnormalities of gait and mobility: Secondary | ICD-10-CM | POA: Diagnosis not present

## 2017-09-08 DIAGNOSIS — G9340 Encephalopathy, unspecified: Secondary | ICD-10-CM | POA: Diagnosis not present

## 2017-09-08 DIAGNOSIS — I10 Essential (primary) hypertension: Secondary | ICD-10-CM | POA: Diagnosis not present

## 2017-09-08 DIAGNOSIS — N189 Chronic kidney disease, unspecified: Secondary | ICD-10-CM | POA: Diagnosis not present

## 2017-09-08 DIAGNOSIS — M6281 Muscle weakness (generalized): Secondary | ICD-10-CM | POA: Diagnosis not present

## 2017-09-09 DIAGNOSIS — N189 Chronic kidney disease, unspecified: Secondary | ICD-10-CM | POA: Diagnosis not present

## 2017-09-09 DIAGNOSIS — M6281 Muscle weakness (generalized): Secondary | ICD-10-CM | POA: Diagnosis not present

## 2017-09-09 DIAGNOSIS — R2689 Other abnormalities of gait and mobility: Secondary | ICD-10-CM | POA: Diagnosis not present

## 2017-09-09 DIAGNOSIS — G9340 Encephalopathy, unspecified: Secondary | ICD-10-CM | POA: Diagnosis not present

## 2017-09-09 DIAGNOSIS — I10 Essential (primary) hypertension: Secondary | ICD-10-CM | POA: Diagnosis not present

## 2017-09-11 DIAGNOSIS — N189 Chronic kidney disease, unspecified: Secondary | ICD-10-CM | POA: Diagnosis not present

## 2017-09-11 DIAGNOSIS — G9340 Encephalopathy, unspecified: Secondary | ICD-10-CM | POA: Diagnosis not present

## 2017-09-11 DIAGNOSIS — R2689 Other abnormalities of gait and mobility: Secondary | ICD-10-CM | POA: Diagnosis not present

## 2017-09-11 DIAGNOSIS — M6281 Muscle weakness (generalized): Secondary | ICD-10-CM | POA: Diagnosis not present

## 2017-09-11 DIAGNOSIS — I10 Essential (primary) hypertension: Secondary | ICD-10-CM | POA: Diagnosis not present

## 2017-09-15 DIAGNOSIS — M6281 Muscle weakness (generalized): Secondary | ICD-10-CM | POA: Diagnosis not present

## 2017-09-15 DIAGNOSIS — I10 Essential (primary) hypertension: Secondary | ICD-10-CM | POA: Diagnosis not present

## 2017-09-15 DIAGNOSIS — N189 Chronic kidney disease, unspecified: Secondary | ICD-10-CM | POA: Diagnosis not present

## 2017-09-15 DIAGNOSIS — R2689 Other abnormalities of gait and mobility: Secondary | ICD-10-CM | POA: Diagnosis not present

## 2017-09-15 DIAGNOSIS — G9340 Encephalopathy, unspecified: Secondary | ICD-10-CM | POA: Diagnosis not present

## 2017-09-16 DIAGNOSIS — I10 Essential (primary) hypertension: Secondary | ICD-10-CM | POA: Diagnosis not present

## 2017-09-16 DIAGNOSIS — N189 Chronic kidney disease, unspecified: Secondary | ICD-10-CM | POA: Diagnosis not present

## 2017-09-16 DIAGNOSIS — R2689 Other abnormalities of gait and mobility: Secondary | ICD-10-CM | POA: Diagnosis not present

## 2017-09-16 DIAGNOSIS — M6281 Muscle weakness (generalized): Secondary | ICD-10-CM | POA: Diagnosis not present

## 2017-09-16 DIAGNOSIS — G9340 Encephalopathy, unspecified: Secondary | ICD-10-CM | POA: Diagnosis not present

## 2017-09-17 DIAGNOSIS — N189 Chronic kidney disease, unspecified: Secondary | ICD-10-CM | POA: Diagnosis not present

## 2017-09-17 DIAGNOSIS — G9340 Encephalopathy, unspecified: Secondary | ICD-10-CM | POA: Diagnosis not present

## 2017-09-17 DIAGNOSIS — M6281 Muscle weakness (generalized): Secondary | ICD-10-CM | POA: Diagnosis not present

## 2017-09-17 DIAGNOSIS — R2689 Other abnormalities of gait and mobility: Secondary | ICD-10-CM | POA: Diagnosis not present

## 2017-09-17 DIAGNOSIS — I10 Essential (primary) hypertension: Secondary | ICD-10-CM | POA: Diagnosis not present

## 2017-09-19 DIAGNOSIS — I10 Essential (primary) hypertension: Secondary | ICD-10-CM | POA: Diagnosis not present

## 2017-09-19 DIAGNOSIS — I4891 Unspecified atrial fibrillation: Secondary | ICD-10-CM | POA: Diagnosis not present

## 2017-09-19 DIAGNOSIS — F329 Major depressive disorder, single episode, unspecified: Secondary | ICD-10-CM | POA: Diagnosis not present

## 2017-09-19 DIAGNOSIS — N3281 Overactive bladder: Secondary | ICD-10-CM | POA: Diagnosis not present

## 2017-10-14 DIAGNOSIS — I4891 Unspecified atrial fibrillation: Secondary | ICD-10-CM | POA: Diagnosis not present

## 2017-10-14 DIAGNOSIS — I10 Essential (primary) hypertension: Secondary | ICD-10-CM | POA: Diagnosis not present

## 2017-10-14 DIAGNOSIS — N3281 Overactive bladder: Secondary | ICD-10-CM | POA: Diagnosis not present

## 2017-10-14 DIAGNOSIS — F329 Major depressive disorder, single episode, unspecified: Secondary | ICD-10-CM | POA: Diagnosis not present

## 2017-11-17 DIAGNOSIS — Z79899 Other long term (current) drug therapy: Secondary | ICD-10-CM | POA: Diagnosis not present

## 2017-11-17 DIAGNOSIS — E119 Type 2 diabetes mellitus without complications: Secondary | ICD-10-CM | POA: Diagnosis not present

## 2017-11-17 DIAGNOSIS — D518 Other vitamin B12 deficiency anemias: Secondary | ICD-10-CM | POA: Diagnosis not present

## 2017-11-17 DIAGNOSIS — E7849 Other hyperlipidemia: Secondary | ICD-10-CM | POA: Diagnosis not present

## 2017-11-17 DIAGNOSIS — E038 Other specified hypothyroidism: Secondary | ICD-10-CM | POA: Diagnosis not present

## 2017-12-04 DIAGNOSIS — R2689 Other abnormalities of gait and mobility: Secondary | ICD-10-CM | POA: Diagnosis not present

## 2017-12-04 DIAGNOSIS — G9340 Encephalopathy, unspecified: Secondary | ICD-10-CM | POA: Diagnosis not present

## 2017-12-04 DIAGNOSIS — N189 Chronic kidney disease, unspecified: Secondary | ICD-10-CM | POA: Diagnosis not present

## 2017-12-04 DIAGNOSIS — M6281 Muscle weakness (generalized): Secondary | ICD-10-CM | POA: Diagnosis not present

## 2017-12-04 DIAGNOSIS — I10 Essential (primary) hypertension: Secondary | ICD-10-CM | POA: Diagnosis not present

## 2017-12-05 DIAGNOSIS — N3281 Overactive bladder: Secondary | ICD-10-CM | POA: Diagnosis not present

## 2017-12-05 DIAGNOSIS — I4891 Unspecified atrial fibrillation: Secondary | ICD-10-CM | POA: Diagnosis not present

## 2017-12-05 DIAGNOSIS — F329 Major depressive disorder, single episode, unspecified: Secondary | ICD-10-CM | POA: Diagnosis not present

## 2017-12-05 DIAGNOSIS — I10 Essential (primary) hypertension: Secondary | ICD-10-CM | POA: Diagnosis not present

## 2017-12-16 DIAGNOSIS — B351 Tinea unguium: Secondary | ICD-10-CM | POA: Diagnosis not present

## 2018-01-20 DIAGNOSIS — R2689 Other abnormalities of gait and mobility: Secondary | ICD-10-CM | POA: Diagnosis not present

## 2018-01-20 DIAGNOSIS — M6281 Muscle weakness (generalized): Secondary | ICD-10-CM | POA: Diagnosis not present

## 2018-01-20 DIAGNOSIS — I10 Essential (primary) hypertension: Secondary | ICD-10-CM | POA: Diagnosis not present

## 2018-01-20 DIAGNOSIS — G9340 Encephalopathy, unspecified: Secondary | ICD-10-CM | POA: Diagnosis not present

## 2018-01-20 DIAGNOSIS — N189 Chronic kidney disease, unspecified: Secondary | ICD-10-CM | POA: Diagnosis not present

## 2018-04-07 DIAGNOSIS — B351 Tinea unguium: Secondary | ICD-10-CM | POA: Diagnosis not present

## 2018-04-07 DIAGNOSIS — I739 Peripheral vascular disease, unspecified: Secondary | ICD-10-CM | POA: Diagnosis not present

## 2018-04-07 DIAGNOSIS — L84 Corns and callosities: Secondary | ICD-10-CM | POA: Diagnosis not present

## 2018-06-23 DIAGNOSIS — H353131 Nonexudative age-related macular degeneration, bilateral, early dry stage: Secondary | ICD-10-CM | POA: Diagnosis not present

## 2018-06-23 DIAGNOSIS — H26493 Other secondary cataract, bilateral: Secondary | ICD-10-CM | POA: Diagnosis not present

## 2018-06-23 DIAGNOSIS — Z961 Presence of intraocular lens: Secondary | ICD-10-CM | POA: Diagnosis not present

## 2018-07-29 DIAGNOSIS — N3281 Overactive bladder: Secondary | ICD-10-CM | POA: Diagnosis not present

## 2018-07-29 DIAGNOSIS — B351 Tinea unguium: Secondary | ICD-10-CM | POA: Diagnosis not present

## 2018-07-29 DIAGNOSIS — I739 Peripheral vascular disease, unspecified: Secondary | ICD-10-CM | POA: Diagnosis not present

## 2018-07-29 DIAGNOSIS — I4891 Unspecified atrial fibrillation: Secondary | ICD-10-CM | POA: Diagnosis not present

## 2018-07-29 DIAGNOSIS — I10 Essential (primary) hypertension: Secondary | ICD-10-CM | POA: Diagnosis not present

## 2018-08-12 DIAGNOSIS — N3281 Overactive bladder: Secondary | ICD-10-CM | POA: Diagnosis not present

## 2018-08-12 DIAGNOSIS — I739 Peripheral vascular disease, unspecified: Secondary | ICD-10-CM | POA: Diagnosis not present

## 2018-08-12 DIAGNOSIS — I10 Essential (primary) hypertension: Secondary | ICD-10-CM | POA: Diagnosis not present

## 2018-08-12 DIAGNOSIS — I4891 Unspecified atrial fibrillation: Secondary | ICD-10-CM | POA: Diagnosis not present

## 2018-08-24 DIAGNOSIS — I739 Peripheral vascular disease, unspecified: Secondary | ICD-10-CM | POA: Diagnosis not present

## 2018-08-24 DIAGNOSIS — I4891 Unspecified atrial fibrillation: Secondary | ICD-10-CM | POA: Diagnosis not present

## 2018-08-24 DIAGNOSIS — I6930 Unspecified sequelae of cerebral infarction: Secondary | ICD-10-CM | POA: Diagnosis not present

## 2018-08-24 DIAGNOSIS — I10 Essential (primary) hypertension: Secondary | ICD-10-CM | POA: Diagnosis not present

## 2018-09-16 DIAGNOSIS — F329 Major depressive disorder, single episode, unspecified: Secondary | ICD-10-CM | POA: Diagnosis not present

## 2018-09-16 DIAGNOSIS — I739 Peripheral vascular disease, unspecified: Secondary | ICD-10-CM | POA: Diagnosis not present

## 2018-09-16 DIAGNOSIS — N3281 Overactive bladder: Secondary | ICD-10-CM | POA: Diagnosis not present

## 2018-09-16 DIAGNOSIS — I10 Essential (primary) hypertension: Secondary | ICD-10-CM | POA: Diagnosis not present

## 2018-10-07 DIAGNOSIS — I739 Peripheral vascular disease, unspecified: Secondary | ICD-10-CM | POA: Diagnosis not present

## 2018-10-07 DIAGNOSIS — I10 Essential (primary) hypertension: Secondary | ICD-10-CM | POA: Diagnosis not present

## 2018-10-07 DIAGNOSIS — I4891 Unspecified atrial fibrillation: Secondary | ICD-10-CM | POA: Diagnosis not present

## 2018-10-07 DIAGNOSIS — N3281 Overactive bladder: Secondary | ICD-10-CM | POA: Diagnosis not present

## 2018-10-12 DIAGNOSIS — F0390 Unspecified dementia without behavioral disturbance: Secondary | ICD-10-CM | POA: Diagnosis not present

## 2018-10-12 DIAGNOSIS — I10 Essential (primary) hypertension: Secondary | ICD-10-CM | POA: Diagnosis not present

## 2018-10-12 DIAGNOSIS — I6930 Unspecified sequelae of cerebral infarction: Secondary | ICD-10-CM | POA: Diagnosis not present

## 2018-10-12 DIAGNOSIS — I4891 Unspecified atrial fibrillation: Secondary | ICD-10-CM | POA: Diagnosis not present

## 2018-11-02 DIAGNOSIS — F0390 Unspecified dementia without behavioral disturbance: Secondary | ICD-10-CM | POA: Diagnosis not present

## 2018-11-02 DIAGNOSIS — I10 Essential (primary) hypertension: Secondary | ICD-10-CM | POA: Diagnosis not present

## 2018-11-02 DIAGNOSIS — I4891 Unspecified atrial fibrillation: Secondary | ICD-10-CM | POA: Diagnosis not present

## 2018-11-02 DIAGNOSIS — I739 Peripheral vascular disease, unspecified: Secondary | ICD-10-CM | POA: Diagnosis not present

## 2018-11-25 DIAGNOSIS — B351 Tinea unguium: Secondary | ICD-10-CM | POA: Diagnosis not present

## 2018-11-25 DIAGNOSIS — I739 Peripheral vascular disease, unspecified: Secondary | ICD-10-CM | POA: Diagnosis not present

## 2018-12-07 DIAGNOSIS — F0390 Unspecified dementia without behavioral disturbance: Secondary | ICD-10-CM | POA: Diagnosis not present

## 2018-12-07 DIAGNOSIS — I739 Peripheral vascular disease, unspecified: Secondary | ICD-10-CM | POA: Diagnosis not present

## 2018-12-07 DIAGNOSIS — F329 Major depressive disorder, single episode, unspecified: Secondary | ICD-10-CM | POA: Diagnosis not present

## 2018-12-07 DIAGNOSIS — I10 Essential (primary) hypertension: Secondary | ICD-10-CM | POA: Diagnosis not present

## 2019-01-04 DIAGNOSIS — I739 Peripheral vascular disease, unspecified: Secondary | ICD-10-CM | POA: Diagnosis not present

## 2019-01-04 DIAGNOSIS — F0390 Unspecified dementia without behavioral disturbance: Secondary | ICD-10-CM | POA: Diagnosis not present

## 2019-01-04 DIAGNOSIS — N3281 Overactive bladder: Secondary | ICD-10-CM | POA: Diagnosis not present

## 2019-01-04 DIAGNOSIS — F329 Major depressive disorder, single episode, unspecified: Secondary | ICD-10-CM | POA: Diagnosis not present

## 2019-01-06 DIAGNOSIS — I10 Essential (primary) hypertension: Secondary | ICD-10-CM | POA: Diagnosis not present

## 2019-01-06 DIAGNOSIS — M6281 Muscle weakness (generalized): Secondary | ICD-10-CM | POA: Diagnosis not present

## 2019-01-06 DIAGNOSIS — R1312 Dysphagia, oropharyngeal phase: Secondary | ICD-10-CM | POA: Diagnosis not present

## 2019-01-06 DIAGNOSIS — R2689 Other abnormalities of gait and mobility: Secondary | ICD-10-CM | POA: Diagnosis not present

## 2019-01-06 DIAGNOSIS — G9340 Encephalopathy, unspecified: Secondary | ICD-10-CM | POA: Diagnosis not present

## 2019-01-06 DIAGNOSIS — N189 Chronic kidney disease, unspecified: Secondary | ICD-10-CM | POA: Diagnosis not present

## 2019-01-11 DIAGNOSIS — R1312 Dysphagia, oropharyngeal phase: Secondary | ICD-10-CM | POA: Diagnosis not present

## 2019-01-11 DIAGNOSIS — I10 Essential (primary) hypertension: Secondary | ICD-10-CM | POA: Diagnosis not present

## 2019-01-11 DIAGNOSIS — R2689 Other abnormalities of gait and mobility: Secondary | ICD-10-CM | POA: Diagnosis not present

## 2019-01-11 DIAGNOSIS — N189 Chronic kidney disease, unspecified: Secondary | ICD-10-CM | POA: Diagnosis not present

## 2019-01-11 DIAGNOSIS — M6281 Muscle weakness (generalized): Secondary | ICD-10-CM | POA: Diagnosis not present

## 2019-01-11 DIAGNOSIS — G9340 Encephalopathy, unspecified: Secondary | ICD-10-CM | POA: Diagnosis not present

## 2019-01-12 DIAGNOSIS — G9340 Encephalopathy, unspecified: Secondary | ICD-10-CM | POA: Diagnosis not present

## 2019-01-12 DIAGNOSIS — R2689 Other abnormalities of gait and mobility: Secondary | ICD-10-CM | POA: Diagnosis not present

## 2019-01-12 DIAGNOSIS — M6281 Muscle weakness (generalized): Secondary | ICD-10-CM | POA: Diagnosis not present

## 2019-01-12 DIAGNOSIS — N189 Chronic kidney disease, unspecified: Secondary | ICD-10-CM | POA: Diagnosis not present

## 2019-01-12 DIAGNOSIS — I10 Essential (primary) hypertension: Secondary | ICD-10-CM | POA: Diagnosis not present

## 2019-01-12 DIAGNOSIS — R1312 Dysphagia, oropharyngeal phase: Secondary | ICD-10-CM | POA: Diagnosis not present

## 2019-01-13 DIAGNOSIS — N189 Chronic kidney disease, unspecified: Secondary | ICD-10-CM | POA: Diagnosis not present

## 2019-01-13 DIAGNOSIS — G9340 Encephalopathy, unspecified: Secondary | ICD-10-CM | POA: Diagnosis not present

## 2019-01-13 DIAGNOSIS — I739 Peripheral vascular disease, unspecified: Secondary | ICD-10-CM | POA: Diagnosis not present

## 2019-01-13 DIAGNOSIS — R1312 Dysphagia, oropharyngeal phase: Secondary | ICD-10-CM | POA: Diagnosis not present

## 2019-01-13 DIAGNOSIS — I10 Essential (primary) hypertension: Secondary | ICD-10-CM | POA: Diagnosis not present

## 2019-01-13 DIAGNOSIS — M6281 Muscle weakness (generalized): Secondary | ICD-10-CM | POA: Diagnosis not present

## 2019-01-13 DIAGNOSIS — R2689 Other abnormalities of gait and mobility: Secondary | ICD-10-CM | POA: Diagnosis not present

## 2019-01-13 DIAGNOSIS — R630 Anorexia: Secondary | ICD-10-CM | POA: Diagnosis not present

## 2019-01-13 DIAGNOSIS — F329 Major depressive disorder, single episode, unspecified: Secondary | ICD-10-CM | POA: Diagnosis not present

## 2019-01-14 DIAGNOSIS — G9340 Encephalopathy, unspecified: Secondary | ICD-10-CM | POA: Diagnosis not present

## 2019-01-14 DIAGNOSIS — N189 Chronic kidney disease, unspecified: Secondary | ICD-10-CM | POA: Diagnosis not present

## 2019-01-14 DIAGNOSIS — I10 Essential (primary) hypertension: Secondary | ICD-10-CM | POA: Diagnosis not present

## 2019-01-14 DIAGNOSIS — R1312 Dysphagia, oropharyngeal phase: Secondary | ICD-10-CM | POA: Diagnosis not present

## 2019-01-14 DIAGNOSIS — R2689 Other abnormalities of gait and mobility: Secondary | ICD-10-CM | POA: Diagnosis not present

## 2019-01-14 DIAGNOSIS — M6281 Muscle weakness (generalized): Secondary | ICD-10-CM | POA: Diagnosis not present

## 2019-01-15 DIAGNOSIS — R2689 Other abnormalities of gait and mobility: Secondary | ICD-10-CM | POA: Diagnosis not present

## 2019-01-15 DIAGNOSIS — M6281 Muscle weakness (generalized): Secondary | ICD-10-CM | POA: Diagnosis not present

## 2019-01-15 DIAGNOSIS — G9340 Encephalopathy, unspecified: Secondary | ICD-10-CM | POA: Diagnosis not present

## 2019-01-15 DIAGNOSIS — I10 Essential (primary) hypertension: Secondary | ICD-10-CM | POA: Diagnosis not present

## 2019-01-15 DIAGNOSIS — R1312 Dysphagia, oropharyngeal phase: Secondary | ICD-10-CM | POA: Diagnosis not present

## 2019-01-15 DIAGNOSIS — N189 Chronic kidney disease, unspecified: Secondary | ICD-10-CM | POA: Diagnosis not present

## 2019-01-18 DIAGNOSIS — I10 Essential (primary) hypertension: Secondary | ICD-10-CM | POA: Diagnosis not present

## 2019-01-18 DIAGNOSIS — R2689 Other abnormalities of gait and mobility: Secondary | ICD-10-CM | POA: Diagnosis not present

## 2019-01-18 DIAGNOSIS — G9340 Encephalopathy, unspecified: Secondary | ICD-10-CM | POA: Diagnosis not present

## 2019-01-18 DIAGNOSIS — N189 Chronic kidney disease, unspecified: Secondary | ICD-10-CM | POA: Diagnosis not present

## 2019-01-18 DIAGNOSIS — R1312 Dysphagia, oropharyngeal phase: Secondary | ICD-10-CM | POA: Diagnosis not present

## 2019-01-18 DIAGNOSIS — M6281 Muscle weakness (generalized): Secondary | ICD-10-CM | POA: Diagnosis not present

## 2019-01-19 DIAGNOSIS — I10 Essential (primary) hypertension: Secondary | ICD-10-CM | POA: Diagnosis not present

## 2019-01-19 DIAGNOSIS — R2689 Other abnormalities of gait and mobility: Secondary | ICD-10-CM | POA: Diagnosis not present

## 2019-01-19 DIAGNOSIS — G9340 Encephalopathy, unspecified: Secondary | ICD-10-CM | POA: Diagnosis not present

## 2019-01-19 DIAGNOSIS — M6281 Muscle weakness (generalized): Secondary | ICD-10-CM | POA: Diagnosis not present

## 2019-01-19 DIAGNOSIS — N189 Chronic kidney disease, unspecified: Secondary | ICD-10-CM | POA: Diagnosis not present

## 2019-01-19 DIAGNOSIS — R1312 Dysphagia, oropharyngeal phase: Secondary | ICD-10-CM | POA: Diagnosis not present

## 2019-01-20 DIAGNOSIS — R1312 Dysphagia, oropharyngeal phase: Secondary | ICD-10-CM | POA: Diagnosis not present

## 2019-01-20 DIAGNOSIS — G9340 Encephalopathy, unspecified: Secondary | ICD-10-CM | POA: Diagnosis not present

## 2019-01-20 DIAGNOSIS — I10 Essential (primary) hypertension: Secondary | ICD-10-CM | POA: Diagnosis not present

## 2019-01-20 DIAGNOSIS — N189 Chronic kidney disease, unspecified: Secondary | ICD-10-CM | POA: Diagnosis not present

## 2019-01-20 DIAGNOSIS — M6281 Muscle weakness (generalized): Secondary | ICD-10-CM | POA: Diagnosis not present

## 2019-01-20 DIAGNOSIS — R2689 Other abnormalities of gait and mobility: Secondary | ICD-10-CM | POA: Diagnosis not present

## 2019-01-21 DIAGNOSIS — M6281 Muscle weakness (generalized): Secondary | ICD-10-CM | POA: Diagnosis not present

## 2019-01-21 DIAGNOSIS — R1312 Dysphagia, oropharyngeal phase: Secondary | ICD-10-CM | POA: Diagnosis not present

## 2019-01-21 DIAGNOSIS — I10 Essential (primary) hypertension: Secondary | ICD-10-CM | POA: Diagnosis not present

## 2019-01-21 DIAGNOSIS — N189 Chronic kidney disease, unspecified: Secondary | ICD-10-CM | POA: Diagnosis not present

## 2019-01-21 DIAGNOSIS — R2689 Other abnormalities of gait and mobility: Secondary | ICD-10-CM | POA: Diagnosis not present

## 2019-01-21 DIAGNOSIS — G9340 Encephalopathy, unspecified: Secondary | ICD-10-CM | POA: Diagnosis not present

## 2019-01-22 DIAGNOSIS — M6281 Muscle weakness (generalized): Secondary | ICD-10-CM | POA: Diagnosis not present

## 2019-01-22 DIAGNOSIS — I10 Essential (primary) hypertension: Secondary | ICD-10-CM | POA: Diagnosis not present

## 2019-01-22 DIAGNOSIS — R2689 Other abnormalities of gait and mobility: Secondary | ICD-10-CM | POA: Diagnosis not present

## 2019-01-22 DIAGNOSIS — R1312 Dysphagia, oropharyngeal phase: Secondary | ICD-10-CM | POA: Diagnosis not present

## 2019-01-22 DIAGNOSIS — N189 Chronic kidney disease, unspecified: Secondary | ICD-10-CM | POA: Diagnosis not present

## 2019-01-22 DIAGNOSIS — G9340 Encephalopathy, unspecified: Secondary | ICD-10-CM | POA: Diagnosis not present

## 2019-02-03 DIAGNOSIS — I739 Peripheral vascular disease, unspecified: Secondary | ICD-10-CM | POA: Diagnosis not present

## 2019-02-03 DIAGNOSIS — R630 Anorexia: Secondary | ICD-10-CM | POA: Diagnosis not present

## 2019-02-03 DIAGNOSIS — F329 Major depressive disorder, single episode, unspecified: Secondary | ICD-10-CM | POA: Diagnosis not present

## 2019-02-03 DIAGNOSIS — I10 Essential (primary) hypertension: Secondary | ICD-10-CM | POA: Diagnosis not present

## 2019-02-08 DIAGNOSIS — E785 Hyperlipidemia, unspecified: Secondary | ICD-10-CM | POA: Diagnosis not present

## 2019-02-08 DIAGNOSIS — D649 Anemia, unspecified: Secondary | ICD-10-CM | POA: Diagnosis not present

## 2019-02-08 DIAGNOSIS — I10 Essential (primary) hypertension: Secondary | ICD-10-CM | POA: Diagnosis not present

## 2019-02-10 DIAGNOSIS — I10 Essential (primary) hypertension: Secondary | ICD-10-CM | POA: Diagnosis not present

## 2019-02-10 DIAGNOSIS — F329 Major depressive disorder, single episode, unspecified: Secondary | ICD-10-CM | POA: Diagnosis not present

## 2019-02-10 DIAGNOSIS — D649 Anemia, unspecified: Secondary | ICD-10-CM | POA: Diagnosis not present

## 2019-02-10 DIAGNOSIS — R630 Anorexia: Secondary | ICD-10-CM | POA: Diagnosis not present

## 2019-02-12 DIAGNOSIS — I10 Essential (primary) hypertension: Secondary | ICD-10-CM | POA: Diagnosis not present

## 2019-02-12 DIAGNOSIS — D519 Vitamin B12 deficiency anemia, unspecified: Secondary | ICD-10-CM | POA: Diagnosis not present

## 2019-02-12 DIAGNOSIS — D649 Anemia, unspecified: Secondary | ICD-10-CM | POA: Diagnosis not present

## 2019-02-22 DIAGNOSIS — I10 Essential (primary) hypertension: Secondary | ICD-10-CM | POA: Diagnosis not present

## 2019-02-22 DIAGNOSIS — D649 Anemia, unspecified: Secondary | ICD-10-CM | POA: Diagnosis not present

## 2019-02-24 DIAGNOSIS — R630 Anorexia: Secondary | ICD-10-CM | POA: Diagnosis not present

## 2019-02-24 DIAGNOSIS — F329 Major depressive disorder, single episode, unspecified: Secondary | ICD-10-CM | POA: Diagnosis not present

## 2019-02-24 DIAGNOSIS — D649 Anemia, unspecified: Secondary | ICD-10-CM | POA: Diagnosis not present

## 2019-02-24 DIAGNOSIS — I10 Essential (primary) hypertension: Secondary | ICD-10-CM | POA: Diagnosis not present

## 2019-02-25 DIAGNOSIS — E119 Type 2 diabetes mellitus without complications: Secondary | ICD-10-CM | POA: Diagnosis not present

## 2019-02-25 DIAGNOSIS — E038 Other specified hypothyroidism: Secondary | ICD-10-CM | POA: Diagnosis not present

## 2019-02-25 DIAGNOSIS — D518 Other vitamin B12 deficiency anemias: Secondary | ICD-10-CM | POA: Diagnosis not present

## 2019-03-01 DIAGNOSIS — D649 Anemia, unspecified: Secondary | ICD-10-CM | POA: Diagnosis not present

## 2019-03-01 DIAGNOSIS — F0390 Unspecified dementia without behavioral disturbance: Secondary | ICD-10-CM | POA: Diagnosis not present

## 2019-03-01 DIAGNOSIS — I4891 Unspecified atrial fibrillation: Secondary | ICD-10-CM | POA: Diagnosis not present

## 2019-03-01 DIAGNOSIS — I10 Essential (primary) hypertension: Secondary | ICD-10-CM | POA: Diagnosis not present

## 2019-03-08 DIAGNOSIS — I10 Essential (primary) hypertension: Secondary | ICD-10-CM | POA: Diagnosis not present

## 2019-03-08 DIAGNOSIS — D649 Anemia, unspecified: Secondary | ICD-10-CM | POA: Diagnosis not present

## 2019-03-11 DIAGNOSIS — D649 Anemia, unspecified: Secondary | ICD-10-CM | POA: Diagnosis not present

## 2019-03-11 DIAGNOSIS — I10 Essential (primary) hypertension: Secondary | ICD-10-CM | POA: Diagnosis not present

## 2019-03-24 DIAGNOSIS — E119 Type 2 diabetes mellitus without complications: Secondary | ICD-10-CM | POA: Diagnosis not present

## 2019-03-24 DIAGNOSIS — E038 Other specified hypothyroidism: Secondary | ICD-10-CM | POA: Diagnosis not present

## 2019-03-24 DIAGNOSIS — D518 Other vitamin B12 deficiency anemias: Secondary | ICD-10-CM | POA: Diagnosis not present

## 2019-03-26 DIAGNOSIS — I10 Essential (primary) hypertension: Secondary | ICD-10-CM | POA: Diagnosis not present

## 2019-03-26 DIAGNOSIS — D649 Anemia, unspecified: Secondary | ICD-10-CM | POA: Diagnosis not present

## 2019-03-31 DIAGNOSIS — I4891 Unspecified atrial fibrillation: Secondary | ICD-10-CM | POA: Diagnosis not present

## 2019-03-31 DIAGNOSIS — I10 Essential (primary) hypertension: Secondary | ICD-10-CM | POA: Diagnosis not present

## 2019-03-31 DIAGNOSIS — F0390 Unspecified dementia without behavioral disturbance: Secondary | ICD-10-CM | POA: Diagnosis not present

## 2019-03-31 DIAGNOSIS — D649 Anemia, unspecified: Secondary | ICD-10-CM | POA: Diagnosis not present

## 2019-04-13 DIAGNOSIS — R293 Abnormal posture: Secondary | ICD-10-CM | POA: Diagnosis not present

## 2019-04-13 DIAGNOSIS — I6930 Unspecified sequelae of cerebral infarction: Secondary | ICD-10-CM | POA: Diagnosis not present

## 2019-04-15 DIAGNOSIS — E038 Other specified hypothyroidism: Secondary | ICD-10-CM | POA: Diagnosis not present

## 2019-04-15 DIAGNOSIS — D518 Other vitamin B12 deficiency anemias: Secondary | ICD-10-CM | POA: Diagnosis not present

## 2019-04-15 DIAGNOSIS — E119 Type 2 diabetes mellitus without complications: Secondary | ICD-10-CM | POA: Diagnosis not present

## 2019-04-20 DIAGNOSIS — R293 Abnormal posture: Secondary | ICD-10-CM | POA: Diagnosis not present

## 2019-04-20 DIAGNOSIS — I6930 Unspecified sequelae of cerebral infarction: Secondary | ICD-10-CM | POA: Diagnosis not present

## 2019-04-21 DIAGNOSIS — R293 Abnormal posture: Secondary | ICD-10-CM | POA: Diagnosis not present

## 2019-04-21 DIAGNOSIS — I6930 Unspecified sequelae of cerebral infarction: Secondary | ICD-10-CM | POA: Diagnosis not present

## 2019-04-22 DIAGNOSIS — I6930 Unspecified sequelae of cerebral infarction: Secondary | ICD-10-CM | POA: Diagnosis not present

## 2019-04-22 DIAGNOSIS — R293 Abnormal posture: Secondary | ICD-10-CM | POA: Diagnosis not present

## 2019-04-23 DIAGNOSIS — R293 Abnormal posture: Secondary | ICD-10-CM | POA: Diagnosis not present

## 2019-04-23 DIAGNOSIS — I6930 Unspecified sequelae of cerebral infarction: Secondary | ICD-10-CM | POA: Diagnosis not present

## 2021-06-28 DEATH — deceased
